# Patient Record
Sex: Male | Born: 1960
Health system: Southern US, Community
[De-identification: ages and names within clinical notes are randomized; demographics above are authoritative.]

## PROBLEM LIST (undated history)

## (undated) DIAGNOSIS — I10 Essential (primary) hypertension: Secondary | ICD-10-CM

## (undated) DIAGNOSIS — Z8661 Personal history of infections of the central nervous system: Secondary | ICD-10-CM

## (undated) DIAGNOSIS — R569 Unspecified convulsions: Secondary | ICD-10-CM

## (undated) DIAGNOSIS — E785 Hyperlipidemia, unspecified: Secondary | ICD-10-CM

## (undated) DIAGNOSIS — H9192 Unspecified hearing loss, left ear: Secondary | ICD-10-CM

## (undated) DIAGNOSIS — T7840XA Allergy, unspecified, initial encounter: Secondary | ICD-10-CM

## (undated) DIAGNOSIS — C801 Malignant (primary) neoplasm, unspecified: Secondary | ICD-10-CM

## (undated) HISTORY — DX: Essential (primary) hypertension: I10

## (undated) HISTORY — PX: COLONOSCOPY: SHX174

## (undated) HISTORY — DX: Unspecified hearing loss, left ear: H91.92

## (undated) HISTORY — DX: Unspecified convulsions: R56.9

## (undated) HISTORY — DX: Personal history of infections of the central nervous system: Z86.61

## (undated) HISTORY — PX: EXPLORATORY LAPAROTOMY: SUR591

## (undated) HISTORY — DX: Allergy, unspecified, initial encounter: T78.40XA

## (undated) HISTORY — PX: WISDOM TOOTH EXTRACTION: SHX21

## (undated) HISTORY — DX: Hyperlipidemia, unspecified: E78.5

---

## 2013-05-20 ENCOUNTER — Ambulatory Visit (INDEPENDENT_AMBULATORY_CARE_PROVIDER_SITE_OTHER): Payer: BC Managed Care – PPO | Admitting: Family Medicine

## 2013-05-20 ENCOUNTER — Encounter: Payer: Self-pay | Admitting: Family Medicine

## 2013-05-20 VITALS — BP 138/87 | HR 108 | Temp 99.4°F | Wt 230.4 lb

## 2013-05-20 DIAGNOSIS — J329 Chronic sinusitis, unspecified: Secondary | ICD-10-CM

## 2013-05-20 MED ORDER — METHYLPREDNISOLONE (PAK) 4 MG PO TABS: ORAL_TABLET | ORAL | Status: DC

## 2013-05-20 MED ORDER — LEVOFLOXACIN 500 MG PO TABS: 500.0000 mg | ORAL_TABLET | Freq: Every day | ORAL | Status: DC

## 2013-05-20 NOTE — Patient Instructions (Signed)

## 2013-05-20 NOTE — Progress Notes (Signed)
  Subjective:    Patient ID: MAXIMILIANO CROMARTIE, male    DOB: [birth year], 52 y.o.   MRN: [id number]  HPI This 52 y.o. male presents for evaluation of sinus congestion, fever, facial pain, and Mucopurulent sinus drainage.   Review of Systems C/o sinus congestion, facial discomfort. No chest pain, SOB, HA, dizziness, vision change, N/V, diarrhea, constipation, dysuria, urinary urgency or frequency, myalgias, arthralgias or rash.     Objective:   Physical Exam Vital signs noted  Well developed well nourished male.  HEENT - Head atraumatic Normocephalic                Eyes - PERRLA, Conjuctiva - clear Sclera- Clear EOMI                Ears - EAC's Wnl TM's Wnl Gross Hearing WNL                Nose - Nares patent                 Throat - oropharanx wnl                Face - TTP maxillary sinuses Respiratory - Lungs CTA bilateral Cardiac - RRR S1 and S2 without murmur GI - Abdomen soft Nontender and bowel sounds active x 4 Extremities - No edema. Neuro - Grossly intact.       Assessment & Plan:  Sinusitis - Plan: levofloxacin (LEVAQUIN) 500 MG tablet, methylPREDNIsolone (MEDROL DOSPACK) 4 MG tablet

## 2013-05-20 NOTE — Progress Notes (Signed)
  Subjective:    Patient ID: Carlos Miranda, male    DOB: 24-Jul-1961, 52 y.o.   MRN: [id number]  HPI Pt c/o of fever and chills that started Sunday evening. Pt also c/o a constant headache that is been going on for about 4 days that is a pounding and aching. Pain is 7/10 and located in the center radiating to down. Pt states he has taken ibuprofen with mild relief.      Review of Systems  Constitutional: Positive for fever, chills and fatigue.  HENT: Positive for congestion.   Neurological: Positive for weakness and headaches.  All other systems reviewed and are negative.       Objective:   Physical Exam  Constitutional: He is oriented to person, place, and time. He appears well-developed and well-nourished.  Eyes: Pupils are equal, round, and reactive to light.  Neck: Normal range of motion. Neck supple. No thyromegaly present.  Cardiovascular: Normal rate, regular rhythm, normal heart sounds and intact distal pulses.   Pulmonary/Chest: Effort normal and breath sounds normal.  Abdominal: Soft. Bowel sounds are normal. He exhibits no distension. There is no tenderness.  Musculoskeletal: Normal range of motion. He exhibits no edema.  Tenderness in sinus   Neurological: He is alert and oriented to person, place, and time.  Skin: Skin is warm and dry.  Psychiatric: He has a normal mood and affect. His behavior is normal. Judgment and thought content normal.          Assessment & Plan:

## 2013-08-07 ENCOUNTER — Encounter: Payer: Self-pay | Admitting: Family Medicine

## 2013-08-07 ENCOUNTER — Ambulatory Visit (INDEPENDENT_AMBULATORY_CARE_PROVIDER_SITE_OTHER): Payer: BC Managed Care – PPO | Admitting: Family Medicine

## 2013-08-07 ENCOUNTER — Encounter (INDEPENDENT_AMBULATORY_CARE_PROVIDER_SITE_OTHER): Payer: Self-pay

## 2013-08-07 ENCOUNTER — Telehealth: Payer: Self-pay | Admitting: Family Medicine

## 2013-08-07 VITALS — BP 129/84 | HR 84 | Temp 98.4°F | Ht 68.0 in | Wt 224.0 lb

## 2013-08-07 DIAGNOSIS — L309 Dermatitis, unspecified: Secondary | ICD-10-CM

## 2013-08-07 DIAGNOSIS — Z23 Encounter for immunization: Secondary | ICD-10-CM

## 2013-08-07 DIAGNOSIS — L259 Unspecified contact dermatitis, unspecified cause: Secondary | ICD-10-CM

## 2013-08-07 MED ORDER — METHYLPREDNISOLONE ACETATE 80 MG/ML IJ SUSP
80.0000 mg | Freq: Once | INTRAMUSCULAR | Status: AC
  Administered 2013-08-07: 80 mg via INTRAMUSCULAR

## 2013-08-07 MED ORDER — HYDROXYZINE HCL 25 MG PO TABS: 25.0000 mg | ORAL_TABLET | Freq: Three times a day (TID) | ORAL | Status: DC | PRN

## 2013-08-07 MED ORDER — METHYLPREDNISOLONE (PAK) 4 MG PO TABS: ORAL_TABLET | ORAL | Status: DC

## 2013-08-07 MED ORDER — NYSTATIN-TRIAMCINOLONE 100000-0.1 UNIT/GM-% EX OINT: TOPICAL_OINTMENT | Freq: Two times a day (BID) | CUTANEOUS | Status: DC

## 2013-08-07 NOTE — Telephone Encounter (Signed)
PT HAD A REACTION LAST YEAR TO PREDNISONE. MADE HIM VERY CONFUSED PER WIFE. SHE WANTS TO DISCUSS WITH YOU. PLEASE CALL

## 2013-08-07 NOTE — Telephone Encounter (Signed)
Have him hold medrol dose pack and use cream and follow up prn

## 2013-08-07 NOTE — Progress Notes (Signed)
  Subjective:    Patient ID: Carlos Miranda, male    DOB: 1960-12-26, 52 y.o.   MRN: [id number]  HPI  This 52 y.o. male presents for evaluation of rash and pruritis over his abdomen And chest for a few days.  He has been itching and taking benadryl.  He has Been working with a wood that is known to cause dermatitis.  Review of Systems C/o rash   No chest pain, SOB, HA, dizziness, vision change, N/V, diarrhea, constipation, dysuria, urinary urgency or frequency, myalgias, arthralgias or rash.  Objective:   Physical Exam Vital signs noted  Well developed well nourished male.  HEENT - Head atraumatic Normocephalic                Eyes - PERRLA, Conjuctiva - clear Sclera- Clear EOMI                Ears - EAC's Wnl TM's Wnl Gross Hearing WNL                Nose - Nares patent                 Throat - oropharanx wnl Respiratory - Lungs CTA bilateral Cardiac - RRR S1 and S2 without murmur GI - Abdomen soft Nontender and bowel sounds active x 4 Skin- Erythematous raised rash over abdomen and chest   Depomedrol80mg  IM given right gluteous    Assessment & Plan:  Dermatitis - Plan: methylPREDNISolone acetate (DEPO-MEDROL) injection 80 mg, hydrOXYzine (ATARAX/VISTARIL) 25 MG tablet, methylPREDNIsolone (MEDROL DOSPACK) 4 MG tablet, nystatin-triamcinolone ointment (MYCOLOG)  Need for prophylactic vaccination and inoculation against influenza  Deatra Canter FNP

## 2013-08-07 NOTE — Telephone Encounter (Signed)
[phone] CORRECT NUMBER. WIFE NOTIFIED AND VERBALIZED UNDERSTANDING. WILL CALL IF ANY PROBLEMS

## 2013-08-07 NOTE — Patient Instructions (Signed)

## 2013-10-28 ENCOUNTER — Telehealth: Payer: Self-pay | Admitting: Family Medicine

## 2013-11-04 NOTE — Telephone Encounter (Signed)
No labwork since 12/27/11

## 2013-11-05 ENCOUNTER — Other Ambulatory Visit: Payer: Self-pay | Admitting: Family Medicine

## 2013-11-05 ENCOUNTER — Telehealth: Payer: Self-pay | Admitting: Family Medicine

## 2013-11-05 MED ORDER — LISINOPRIL 10 MG PO TABS: 10.0000 mg | ORAL_TABLET | Freq: Every day | ORAL | Status: DC

## 2013-11-05 NOTE — Telephone Encounter (Signed)
[facility]  If having difficulty then can print rx off and give to patient to send to [facility] but should go through ok

## 2013-11-07 ENCOUNTER — Other Ambulatory Visit: Payer: Self-pay | Admitting: *Deleted

## 2013-11-07 MED ORDER — LISINOPRIL 10 MG PO TABS: 10.0000 mg | ORAL_TABLET | Freq: Every day | ORAL | Status: DC

## 2013-11-07 MED ORDER — PRAVASTATIN SODIUM 40 MG PO TABS: 40.0000 mg | ORAL_TABLET | Freq: Every day | ORAL | Status: DC

## 2013-11-07 NOTE — Telephone Encounter (Signed)
Patient last seen in office for an acute visit on 11-07-12. Last chronic follow up visit 12-06-11 with Mitzi Hansen. Please advise

## 2014-03-26 ENCOUNTER — Other Ambulatory Visit: Payer: Self-pay | Admitting: Nurse Practitioner

## 2014-04-30 ENCOUNTER — Telehealth: Payer: Self-pay | Admitting: Family Medicine

## 2014-05-04 MED ORDER — PRAVASTATIN SODIUM 40 MG PO TABS: 40.0000 mg | ORAL_TABLET | Freq: Every day | ORAL | Status: DC

## 2014-05-04 NOTE — Telephone Encounter (Signed)
VO per [facility], fill the 30 day rx, but pt must be seen before a 90 day can be filled. Pt aware.

## 2014-05-22 ENCOUNTER — Ambulatory Visit (INDEPENDENT_AMBULATORY_CARE_PROVIDER_SITE_OTHER): Payer: BC Managed Care – PPO | Admitting: Family Medicine

## 2014-05-22 ENCOUNTER — Encounter: Payer: Self-pay | Admitting: Family Medicine

## 2014-05-22 ENCOUNTER — Telehealth: Payer: Self-pay | Admitting: Family Medicine

## 2014-05-22 VITALS — BP 117/75 | HR 84 | Temp 98.5°F | Ht 68.0 in | Wt 232.2 lb

## 2014-05-22 DIAGNOSIS — E785 Hyperlipidemia, unspecified: Secondary | ICD-10-CM

## 2014-05-22 DIAGNOSIS — I10 Essential (primary) hypertension: Secondary | ICD-10-CM

## 2014-05-22 DIAGNOSIS — J0120 Acute ethmoidal sinusitis, unspecified: Secondary | ICD-10-CM

## 2014-05-22 DIAGNOSIS — Z0000 Encounter for general adult medical examination without abnormal findings: Secondary | ICD-10-CM

## 2014-05-22 LAB — POCT CBC
Granulocyte percent: 56.7 %G (ref 37–80)
HCT, POC: 49.5 % (ref 43.5–53.7)
Hemoglobin: 16.2 g/dL (ref 14.1–18.1)
Lymph, poc: 2.5 (ref 0.6–3.4)
MCH, POC: 30.3 pg (ref 27–31.2)
MCHC: 32.7 g/dL (ref 31.8–35.4)
MCV: 92.7 fL (ref 80–97)
MPV: 8 fL (ref 0–99.8)
POC Granulocyte: 3.9 (ref 2–6.9)
POC LYMPH PERCENT: 35.6 %L (ref 10–50)
Platelet Count, POC: 214 10*3/uL (ref 142–424)
RBC: 5.3 M/uL (ref 4.69–6.13)
RDW, POC: 13.2 %
WBC: 6.9 10*3/uL (ref 4.6–10.2)

## 2014-05-22 MED ORDER — LISINOPRIL 10 MG PO TABS: 10.0000 mg | ORAL_TABLET | Freq: Every day | ORAL | Status: DC

## 2014-05-22 MED ORDER — PRAVASTATIN SODIUM 40 MG PO TABS: 40.0000 mg | ORAL_TABLET | Freq: Every day | ORAL | Status: DC

## 2014-05-22 MED ORDER — LEVOFLOXACIN 500 MG PO TABS: 500.0000 mg | ORAL_TABLET | Freq: Every day | ORAL | Status: DC

## 2014-05-22 MED ORDER — METHYLPREDNISOLONE ACETATE 80 MG/ML IJ SUSP
80.0000 mg | Freq: Once | INTRAMUSCULAR | Status: AC
  Administered 2014-05-22: 80 mg via INTRAMUSCULAR

## 2014-05-22 NOTE — Progress Notes (Signed)
   Subjective:    Patient ID: Carlos Miranda, male    DOB: February 14, 1961, 53 y.o.   MRN: [id number]  HPI This 53 y.o. male presents for evaluation of sinus infection and he is here for routine visit. He needs refills and cpe labs.   Review of Systems No chest pain, SOB, HA, dizziness, vision change, N/V, diarrhea, constipation, dysuria, urinary urgency or frequency, myalgias, arthralgias or rash.     Objective:   Physical Exam Vital signs noted  Well developed well nourished male.  HEENT - Head atraumatic Normocephalic                Eyes - PERRLA, Conjuctiva - clear Sclera- Clear EOMI                Ears - EAC's Wnl TM's Wnl Gross Hearing WNL                Nose - Nares patent                 Throat - oropharanx wnl Respiratory - Lungs CTA bilateral Cardiac - RRR S1 and S2 without murmur GI - Abdomen soft Nontender and bowel sounds active x 4 Extremities - No edema. Neuro - Grossly intact.       Assessment & Plan:  Routine general medical examination at a health care facility - Plan: POCT CBC, CMP14+EGFR, Lipid panel, Thyroid Panel With TSH, PSA, total and free, TSH  Subacute ethmoidal sinusitis - Plan: levofloxacin (LEVAQUIN) 500 MG tablet, methylPREDNISolone acetate (DEPO-MEDROL) injection 80 mg  Other and unspecified hyperlipidemia - Plan: pravastatin (PRAVACHOL) 40 MG tablet, DISCONTINUED: pravastatin (PRAVACHOL) 40 MG tablet  Essential hypertension - Plan: lisinopril (PRINIVIL,ZESTRIL) 10 MG tablet, DISCONTINUED: lisinopril (PRINIVIL,ZESTRIL) 10 MG tablet  Lysbeth Penner FNP

## 2014-05-22 NOTE — Telephone Encounter (Signed)
[facility]

## 2014-05-23 LAB — THYROID PANEL WITH TSH
Free Thyroxine Index: 2.8 (ref 1.2–4.9)
T3 Uptake Ratio: 24 % (ref 24–39)
T4, Total: 11.5 ug/dL (ref 4.5–12.0)
TSH: 2.37 u[IU]/mL (ref 0.450–4.500)

## 2014-05-23 LAB — CMP14+EGFR
ALT: 20 IU/L (ref 0–44)
AST: 18 IU/L (ref 0–40)
Albumin/Globulin Ratio: 2 (ref 1.1–2.5)
Albumin: 4.6 g/dL (ref 3.5–5.5)
Alkaline Phosphatase: 97 IU/L (ref 39–117)
BUN/Creatinine Ratio: 16 (ref 9–20)
BUN: 16 mg/dL (ref 6–24)
CO2: 22 mmol/L (ref 18–29)
Calcium: 9.5 mg/dL (ref 8.7–10.2)
Chloride: 99 mmol/L (ref 97–108)
Creatinine, Ser: 1 mg/dL (ref 0.76–1.27)
GFR calc Af Amer: 100 mL/min/{1.73_m2} (ref >59)
GFR calc non Af Amer: 86 mL/min/{1.73_m2} (ref >59)
Globulin, Total: 2.3 g/dL (ref 1.5–4.5)
Glucose: 93 mg/dL (ref 65–99)
Potassium: 4.5 mmol/L (ref 3.5–5.2)
Sodium: 139 mmol/L (ref 134–144)
Total Bilirubin: 0.5 mg/dL (ref 0.0–1.2)
Total Protein: 6.9 g/dL (ref 6.0–8.5)

## 2014-05-23 LAB — PSA, TOTAL AND FREE
PSA, Free Pct: 30 %
PSA, Free: 0.12 ng/mL
PSA: 0.4 ng/mL (ref 0.0–4.0)

## 2014-05-23 LAB — LIPID PANEL
Chol/HDL Ratio: 4.1 ratio units (ref 0.0–5.0)
Cholesterol, Total: 165 mg/dL (ref 100–199)
HDL: 40 mg/dL (ref >39)
LDL Calculated: 79 mg/dL (ref 0–99)
Triglycerides: 232 mg/dL — ABNORMAL HIGH (ref 0–149)
VLDL Cholesterol Cal: 46 mg/dL — ABNORMAL HIGH (ref 5–40)

## 2014-06-11 ENCOUNTER — Ambulatory Visit: Payer: BC Managed Care – PPO | Admitting: Family Medicine

## 2014-09-03 ENCOUNTER — Telehealth: Payer: Self-pay | Admitting: Family Medicine

## 2014-09-03 DIAGNOSIS — Z23 Encounter for immunization: Secondary | ICD-10-CM

## 2014-09-03 MED ORDER — ZOSTER VACCINE LIVE 19400 UNT/0.65ML ~~LOC~~ SOLR: 0.6500 mL | Freq: Once | SUBCUTANEOUS | Status: DC

## 2014-09-03 NOTE — Telephone Encounter (Signed)
Prescription sent in for zostavax and patient aware

## 2015-04-08 ENCOUNTER — Other Ambulatory Visit: Payer: Self-pay | Admitting: Family Medicine

## 2015-05-25 ENCOUNTER — Telehealth: Payer: Self-pay | Admitting: Physician Assistant

## 2015-05-25 ENCOUNTER — Encounter: Payer: Self-pay | Admitting: Physician Assistant

## 2015-05-25 ENCOUNTER — Ambulatory Visit (INDEPENDENT_AMBULATORY_CARE_PROVIDER_SITE_OTHER): Payer: BLUE CROSS/BLUE SHIELD | Admitting: Physician Assistant

## 2015-05-25 VITALS — BP 142/89 | HR 88 | Temp 97.4°F | Ht 68.0 in | Wt 236.3 lb

## 2015-05-25 DIAGNOSIS — M545 Low back pain, unspecified: Secondary | ICD-10-CM

## 2015-05-25 DIAGNOSIS — R350 Frequency of micturition: Secondary | ICD-10-CM | POA: Diagnosis not present

## 2015-05-25 DIAGNOSIS — M79671 Pain in right foot: Secondary | ICD-10-CM

## 2015-05-25 LAB — POCT URINALYSIS DIPSTICK
Bilirubin, UA: NEGATIVE
Blood, UA: NEGATIVE
Glucose, UA: NEGATIVE
Ketones, UA: NEGATIVE
Leukocytes, UA: NEGATIVE
Nitrite, UA: NEGATIVE
Spec Grav, UA: 1.015
Urobilinogen, UA: NEGATIVE
pH, UA: 8

## 2015-05-25 LAB — POCT UA - MICROSCOPIC ONLY
Bacteria, U Microscopic: NEGATIVE
Casts, Ur, LPF, POC: NEGATIVE
Crystals, Ur, HPF, POC: NEGATIVE
RBC, urine, microscopic: NEGATIVE
WBC, Ur, HPF, POC: NEGATIVE
Yeast, UA: NEGATIVE

## 2015-05-25 MED ORDER — BACLOFEN 10 MG PO TABS: 10.0000 mg | ORAL_TABLET | Freq: Three times a day (TID) | ORAL | Status: DC

## 2015-05-25 MED ORDER — PREDNISONE 10 MG (21) PO TBPK: ORAL_TABLET | ORAL | Status: DC

## 2015-05-25 MED ORDER — MELOXICAM 15 MG PO TABS: 15.0000 mg | ORAL_TABLET | Freq: Every day | ORAL | Status: DC

## 2015-05-25 NOTE — Telephone Encounter (Signed)
We do not have that as an allergy for him. What is his reason for not being able to take it? There is no substitute.If he can't take it,  Continue to take mobic and baclofen. Keep follow up in 2 weeks

## 2015-05-25 NOTE — Progress Notes (Signed)
Subjective:    Patient ID: Carlos Miranda, male    DOB: June 24, 1961, 54 y.o.   MRN: [id number]  HPI 54 y/o male presents with c/o lower right sided back pain x 2 days. He woke up and the pain was present. Denies injury. Pain is Dull pain to sharp shooting pain but constant. More localized to right side. Pain is worse with twisting. Better with sitting. Has tried a few ibuprofen with some relief.   Also has right side heel pain occasionally. He was diagnosed with heel spurs 20 years ago and given a prednisone injection, with relief.     Review of Systems  Endocrine: Negative for polyuria.  Genitourinary: Positive for frequency. Negative for dysuria, urgency, hematuria, flank pain and difficulty urinating.  Musculoskeletal: Positive for myalgias and back pain. Arthralgias: right side low back pain , worse with twisting , radiates to left side occasionally   Neurological: Negative for numbness.       Objective:   Physical Exam  Constitutional: He is oriented to person, place, and time. He appears well-developed and well-nourished.  Musculoskeletal: He exhibits tenderness (right paraspinal muscle and trapezius). He exhibits no edema.  Pain increases with AROM, lateral rotation and forward flexion   Neurological: He is alert and oriented to person, place, and time.  Nursing note and vitals reviewed.         Assessment & Plan:  1. Right-sided low back pain without sciatica  - predniSONE (STERAPRED UNI-PAK 21 TAB) 10 MG (21) TBPK tablet; 6 pills PO on day 1, 5 on day 2, 4 on day 3, 3 on day 4, 2 on day 5, 1 on day 6  Dispense: 21 tablet; Refill: 0 - baclofen (LIORESAL) 10 MG tablet; Take 1 tablet (10 mg total) by mouth 3 (three) times daily.  Dispense: 30 each; Refill: 0 - meloxicam (MOBIC) 15 MG tablet; Take 1 tablet (15 mg total) by mouth daily.  Dispense: 30 tablet; Refill: 0  2. Urinary frequency  - POCT UA - Microscopic Only - POCT urinalysis dipstick  3. Heel pain, right -  If no improvement in 2 weeks at recheck will injection intralesional with depomedrol    RTO 2 week   Edris Friedt A. Chauncey Reading PA-C

## 2015-05-25 NOTE — Patient Instructions (Signed)
Low Back Sprain with Rehab  A sprain is an injury in which a ligament is torn. The ligaments of the lower back are vulnerable to sprains. However, they are strong and require great force to be injured. These ligaments are important for stabilizing the spinal column. Sprains are classified into three categories. Grade 1 sprains cause pain, but the tendon is not lengthened. Grade 2 sprains include a lengthened ligament, due to the ligament being stretched or partially ruptured. With grade 2 sprains there is still function, although the function may be decreased. Grade 3 sprains involve a complete tear of the tendon or muscle, and function is usually impaired. SYMPTOMS   Severe pain in the lower back.  Sometimes, a feeling of a "pop," "snap," or tear, at the time of injury.  Tenderness and sometimes swelling at the injury site.  Uncommonly, bruising (contusion) within 48 hours of injury.  Muscle spasms in the back. CAUSES  Low back sprains occur when a force is placed on the ligaments that is greater than they can handle. Common causes of injury include:  Performing a stressful act while off-balance.  Repetitive stressful activities that involve movement of the lower back.  Direct hit (trauma) to the lower back. RISK INCREASES WITH:  Contact sports (football, wrestling).  Collisions (major skiing accidents).  Sports that require throwing or lifting (baseball, weightlifting).  Sports involving twisting of the spine (gymnastics, diving, tennis, golf).  Poor strength and flexibility.  Inadequate protection.  Previous back injury or surgery (especially fusion). PREVENTION  Wear properly fitted and padded protective equipment.  Warm up and stretch properly before activity.  Allow for adequate recovery between workouts.  Maintain physical fitness:  Strength, flexibility, and endurance.  Cardiovascular fitness.  Maintain a healthy body weight. PROGNOSIS  If treated  properly, low back sprains usually heal with non-surgical treatment. The length of time for healing depends on the severity of the injury.  RELATED COMPLICATIONS   Recurring symptoms, resulting in a chronic problem.  Chronic inflammation and pain in the low back.  Delayed healing or resolution of symptoms, especially if activity is resumed too soon.  Prolonged impairment.  Unstable or arthritic joints of the low back. TREATMENT  Treatment first involves the use of ice and medicine, to reduce pain and inflammation. The use of strengthening and stretching exercises may help reduce pain with activity. These exercises may be performed at home or with a therapist. Severe injuries may require referral to a therapist for further evaluation and treatment, such as ultrasound. Your caregiver may advise that you wear a back brace or corset, to help reduce pain and discomfort. Often, prolonged bed rest results in greater harm then benefit. Corticosteroid injections may be recommended. However, these should be reserved for the most serious cases. It is important to avoid using your back when lifting objects. At night, sleep on your back on a firm mattress, with a pillow placed under your knees. If non-surgical treatment is unsuccessful, surgery may be needed.  MEDICATION   If pain medicine is needed, nonsteroidal anti-inflammatory medicines (aspirin and ibuprofen), or other minor pain relievers (acetaminophen), are often advised.  Do not take pain medicine for 7 days before surgery.  Prescription pain relievers may be given, if your caregiver thinks they are needed. Use only as directed and only as much as you need.  Ointments applied to the skin may be helpful.  Corticosteroid injections may be given by your caregiver. These injections should be reserved for the most serious cases,   because they may only be given a certain number of times. HEAT AND COLD  Cold treatment (icing) should be applied for 10  to 15 minutes every 2 to 3 hours for inflammation and pain, and immediately after activity that aggravates your symptoms. Use ice packs or an ice massage.  Heat treatment may be used before performing stretching and strengthening activities prescribed by your caregiver, physical therapist, or athletic trainer. Use a heat pack or a warm water soak. SEEK MEDICAL CARE IF:   Symptoms get worse or do not improve in 2 to 4 weeks, despite treatment.  You develop numbness or weakness in either leg.  You lose bowel or bladder function.  Any of the following occur after surgery: fever, increased pain, swelling, redness, drainage of fluids, or bleeding in the affected area.  New, unexplained symptoms develop. (Drugs used in treatment may produce side effects.) EXERCISES  RANGE OF MOTION (ROM) AND STRETCHING EXERCISES - Low Back Sprain Most people with lower back pain will find that their symptoms get worse with excessive bending forward (flexion) or arching at the lower back (extension). The exercises that will help resolve your symptoms will focus on the opposite motion.  Your physician, physical therapist or athletic trainer will help you determine which exercises will be most helpful to resolve your lower back pain. Do not complete any exercises without first consulting with your caregiver. Discontinue any exercises which make your symptoms worse, until you speak to your caregiver. If you have pain, numbness or tingling which travels down into your buttocks, leg or foot, the goal of the therapy is for these symptoms to move closer to your back and eventually resolve. Sometimes, these leg symptoms will get better, but your lower back pain may worsen. This is often an indication of progress in your rehabilitation. Be very alert to any changes in your symptoms and the activities in which you participated in the 24 hours prior to the change. Sharing this information with your caregiver will allow him or her to  most efficiently treat your condition. These exercises may help you when beginning to rehabilitate your injury. Your symptoms may resolve with or without further involvement from your physician, physical therapist or athletic trainer. While completing these exercises, remember:   Restoring tissue flexibility helps normal motion to return to the joints. This allows healthier, less painful movement and activity.  An effective stretch should be held for at least 30 seconds.  A stretch should never be painful. You should only feel a gentle lengthening or release in the stretched tissue. FLEXION RANGE OF MOTION AND STRETCHING EXERCISES: STRETCH - Flexion, Single Knee to Chest   Lie on a firm bed or floor with both legs extended in front of you.  Keeping one leg in contact with the floor, bring your opposite knee to your chest. Hold your leg in place by either grabbing behind your thigh or at your knee.  Pull until you feel a gentle stretch in your low back. Hold __________ seconds.  Slowly release your grasp and repeat the exercise with the opposite side. Repeat __________ times. Complete this exercise __________ times per day.  STRETCH - Flexion, Double Knee to Chest  Lie on a firm bed or floor with both legs extended in front of you.  Keeping one leg in contact with the floor, bring your opposite knee to your chest.  Tense your stomach muscles to support your back and then lift your other knee to your chest. Hold your legs   in place by either grabbing behind your thighs or at your knees.  Pull both knees toward your chest until you feel a gentle stretch in your low back. Hold __________ seconds.  Tense your stomach muscles and slowly return one leg at a time to the floor. Repeat __________ times. Complete this exercise __________ times per day.  STRETCH - Low Trunk Rotation  Lie on a firm bed or floor. Keeping your legs in front of you, bend your knees so they are both pointed toward the  ceiling and your feet are flat on the floor.  Extend your arms out to the side. This will stabilize your upper body by keeping your shoulders in contact with the floor.  Gently and slowly drop both knees together to one side until you feel a gentle stretch in your low back. Hold for __________ seconds.  Tense your stomach muscles to support your lower back as you bring your knees back to the starting position. Repeat the exercise to the other side. Repeat __________ times. Complete this exercise __________ times per day  EXTENSION RANGE OF MOTION AND FLEXIBILITY EXERCISES: STRETCH - Extension, Prone on Elbows   Lie on your stomach on the floor, a bed will be too soft. Place your palms about shoulder width apart and at the height of your head.  Place your elbows under your shoulders. If this is too painful, stack pillows under your chest.  Allow your body to relax so that your hips drop lower and make contact more completely with the floor.  Hold this position for __________ seconds.  Slowly return to lying flat on the floor. Repeat __________ times. Complete this exercise __________ times per day.  RANGE OF MOTION - Extension, Prone Press Ups  Lie on your stomach on the floor, a bed will be too soft. Place your palms about shoulder width apart and at the height of your head.  Keeping your back as relaxed as possible, slowly straighten your elbows while keeping your hips on the floor. You may adjust the placement of your hands to maximize your comfort. As you gain motion, your hands will come more underneath your shoulders.  Hold this position __________ seconds.  Slowly return to lying flat on the floor. Repeat __________ times. Complete this exercise __________ times per day.  RANGE OF MOTION- Quadruped, Neutral Spine   Assume a hands and knees position on a firm surface. Keep your hands under your shoulders and your knees under your hips. You may place padding under your knees for  comfort.  Drop your head and point your tailbone toward the ground below you. This will round out your lower back like an angry cat. Hold this position for __________ seconds.  Slowly lift your head and release your tail bone so that your back sags into a large arch, like an old horse.  Hold this position for __________ seconds.  Repeat this until you feel limber in your low back.  Now, find your "sweet spot." This will be the most comfortable position somewhere between the two previous positions. This is your neutral spine. Once you have found this position, tense your stomach muscles to support your low back.  Hold this position for __________ seconds. Repeat __________ times. Complete this exercise __________ times per day.  STRENGTHENING EXERCISES - Low Back Sprain These exercises may help you when beginning to rehabilitate your injury. These exercises should be done near your "sweet spot." This is the neutral, low-back arch, somewhere between fully rounded   and fully arched, that is your least painful position. When performed in this safe range of motion, these exercises can be used for people who have either a flexion or extension based injury. These exercises may resolve your symptoms with or without further involvement from your physician, physical therapist or athletic trainer. While completing these exercises, remember:   Muscles can gain both the endurance and the strength needed for everyday activities through controlled exercises.  Complete these exercises as instructed by your physician, physical therapist or athletic trainer. Increase the resistance and repetitions only as guided.  You may experience muscle soreness or fatigue, but the pain or discomfort you are trying to eliminate should never worsen during these exercises. If this pain does worsen, stop and make certain you are following the directions exactly. If the pain is still present after adjustments, discontinue the  exercise until you can discuss the trouble with your caregiver. STRENGTHENING - Deep Abdominals, Pelvic Tilt   Lie on a firm bed or floor. Keeping your legs in front of you, bend your knees so they are both pointed toward the ceiling and your feet are flat on the floor.  Tense your lower abdominal muscles to press your low back into the floor. This motion will rotate your pelvis so that your tail bone is scooping upwards rather than pointing at your feet or into the floor. With a gentle tension and even breathing, hold this position for __________ seconds. Repeat __________ times. Complete this exercise __________ times per day.  STRENGTHENING - Abdominals, Crunches   Lie on a firm bed or floor. Keeping your legs in front of you, bend your knees so they are both pointed toward the ceiling and your feet are flat on the floor. Cross your arms over your chest.  Slightly tip your chin down without bending your neck.  Tense your abdominals and slowly lift your trunk high enough to just clear your shoulder blades. Lifting higher can put excessive stress on the lower back and does not further strengthen your abdominal muscles.  Control your return to the starting position. Repeat __________ times. Complete this exercise __________ times per day.  STRENGTHENING - Quadruped, Opposite UE/LE Lift   Assume a hands and knees position on a firm surface. Keep your hands under your shoulders and your knees under your hips. You may place padding under your knees for comfort.  Find your neutral spine and gently tense your abdominal muscles so that you can maintain this position. Your shoulders and hips should form a rectangle that is parallel with the floor and is not twisted.  Keeping your trunk steady, lift your right hand no higher than your shoulder and then your left leg no higher than your hip. Make sure you are not holding your breath. Hold this position for __________ seconds.  Continuing to keep  your abdominal muscles tense and your back steady, slowly return to your starting position. Repeat with the opposite arm and leg. Repeat __________ times. Complete this exercise __________ times per day.  STRENGTHENING - Abdominals and Quadriceps, Straight Leg Raise   Lie on a firm bed or floor with both legs extended in front of you.  Keeping one leg in contact with the floor, bend the other knee so that your foot can rest flat on the floor.  Find your neutral spine, and tense your abdominal muscles to maintain your spinal position throughout the exercise.  Slowly lift your straight leg off the floor about 6 inches for a count   of 15, making sure to not hold your breath.  Still keeping your neutral spine, slowly lower your leg all the way to the floor. Repeat this exercise with each leg __________ times. Complete this exercise __________ times per day. POSTURE AND BODY MECHANICS CONSIDERATIONS - Low Back Sprain Keeping correct posture when sitting, standing or completing your activities will reduce the stress put on different body tissues, allowing injured tissues a chance to heal and limiting painful experiences. The following are general guidelines for improved posture. Your physician or physical therapist will provide you with any instructions specific to your needs. While reading these guidelines, remember:  The exercises prescribed by your provider will help you have the flexibility and strength to maintain correct postures.  The correct posture provides the best environment for your joints to work. All of your joints have less wear and tear when properly supported by a spine with good posture. This means you will experience a healthier, less painful body.  Correct posture must be practiced with all of your activities, especially prolonged sitting and standing. Correct posture is as important when doing repetitive low-stress activities (typing) as it is when doing a single heavy-load  activity (lifting). RESTING POSITIONS Consider which positions are most painful for you when choosing a resting position. If you have pain with flexion-based activities (sitting, bending, stooping, squatting), choose a position that allows you to rest in a less flexed posture. You would want to avoid curling into a fetal position on your side. If your pain worsens with extension-based activities (prolonged standing, working overhead), avoid resting in an extended position such as sleeping on your stomach. Most people will find more comfort when they rest with their spine in a more neutral position, neither too rounded nor too arched. Lying on a non-sagging bed on your side with a pillow between your knees, or on your back with a pillow under your knees will often provide some relief. Keep in mind, being in any one position for a prolonged period of time, no matter how correct your posture, can still lead to stiffness. PROPER SITTING POSTURE In order to minimize stress and discomfort on your spine, you must sit with correct posture. Sitting with good posture should be effortless for a healthy body. Returning to good posture is a gradual process. Many people can work toward this most comfortably by using various supports until they have the flexibility and strength to maintain this posture on their own. When sitting with proper posture, your ears will fall over your shoulders and your shoulders will fall over your hips. You should use the back of the chair to support your upper back. Your lower back will be in a neutral position, just slightly arched. You may place a small pillow or folded towel at the base of your lower back for  support.  When working at a desk, create an environment that supports good, upright posture. Without extra support, muscles tire, which leads to excessive strain on joints and other tissues. Keep these recommendations in mind: CHAIR:  A chair should be able to slide under your desk  when your back makes contact with the back of the chair. This allows you to work closely.  The chair's height should allow your eyes to be level with the upper part of your monitor and your hands to be slightly lower than your elbows. BODY POSITION  Your feet should make contact with the floor. If this is not possible, use a foot rest.  Keep your   ears over your shoulders. This will reduce stress on your neck and low back. INCORRECT SITTING POSTURES  If you are feeling tired and unable to assume a healthy sitting posture, do not slouch or slump. This puts excessive strain on your back tissues, causing more damage and pain. Healthier options include:  Using more support, like a lumbar pillow.  Switching tasks to something that requires you to be upright or walking.  Talking a brief walk.  Lying down to rest in a neutral-spine position. PROLONGED STANDING WHILE SLIGHTLY LEANING FORWARD  When completing a task that requires you to lean forward while standing in one place for a long time, place either foot up on a stationary 2-4 inch high object to help maintain the best posture. When both feet are on the ground, the lower back tends to lose its slight inward curve. If this curve flattens (or becomes too large), then the back and your other joints will experience too much stress, tire more quickly, and can cause pain. CORRECT STANDING POSTURES Proper standing posture should be assumed with all daily activities, even if they only take a few moments, like when brushing your teeth. As in sitting, your ears should fall over your shoulders and your shoulders should fall over your hips. You should keep a slight tension in your abdominal muscles to brace your spine. Your tailbone should point down to the ground, not behind your body, resulting in an over-extended swayback posture.  INCORRECT STANDING POSTURES  Common incorrect standing postures include a forward head, locked knees and/or an excessive  swayback. WALKING Walk with an upright posture. Your ears, shoulders and hips should all line-up. PROLONGED ACTIVITY IN A FLEXED POSITION When completing a task that requires you to bend forward at your waist or lean over a low surface, try to find a way to stabilize 3 out of 4 of your limbs. You can place a hand or elbow on your thigh or rest a knee on the surface you are reaching across. This will provide you more stability, so that your muscles do not tire as quickly. By keeping your knees relaxed, or slightly bent, you will also reduce stress across your lower back. CORRECT LIFTING TECHNIQUES DO :  Assume a wide stance. This will provide you more stability and the opportunity to get as close as possible to the object which you are lifting.  Tense your abdominals to brace your spine. Bend at the knees and hips. Keeping your back locked in a neutral-spine position, lift using your leg muscles. Lift with your legs, keeping your back straight.  Test the weight of unknown objects before attempting to lift them.  Try to keep your elbows locked down at your sides in order get the best strength from your shoulders when carrying an object.  Always ask for help when lifting heavy or awkward objects. INCORRECT LIFTING TECHNIQUES DO NOT:   Lock your knees when lifting, even if it is a small object.  Bend and twist. Pivot at your feet or move your feet when needing to change directions.  Assume that you can safely pick up even a paperclip without proper posture. Document Released: 10/16/2005 Document Revised: 01/08/2012 Document Reviewed: 01/28/2009 [employer] Patient Information 2015 [employer]. This information is not intended to replace advice given to you by your health care provider. Make sure you discuss any questions you have with your health care provider.  

## 2015-05-26 NOTE — Telephone Encounter (Signed)
Patients wife states he got kind of loopy. Patient states he went ahead and took it this am and he will let us know if he has problems.

## 2015-06-02 ENCOUNTER — Other Ambulatory Visit: Payer: Self-pay | Admitting: Family Medicine

## 2015-06-03 ENCOUNTER — Ambulatory Visit (INDEPENDENT_AMBULATORY_CARE_PROVIDER_SITE_OTHER): Payer: BLUE CROSS/BLUE SHIELD | Admitting: Family Medicine

## 2015-06-03 ENCOUNTER — Encounter: Payer: Self-pay | Admitting: Family Medicine

## 2015-06-03 VITALS — BP 141/88 | HR 83 | Temp 96.0°F | Ht 68.0 in | Wt 238.0 lb

## 2015-06-03 DIAGNOSIS — E785 Hyperlipidemia, unspecified: Secondary | ICD-10-CM

## 2015-06-03 DIAGNOSIS — S50902A Unspecified superficial injury of left elbow, initial encounter: Secondary | ICD-10-CM | POA: Diagnosis not present

## 2015-06-03 DIAGNOSIS — W57XXXA Bitten or stung by nonvenomous insect and other nonvenomous arthropods, initial encounter: Secondary | ICD-10-CM | POA: Insufficient documentation

## 2015-06-03 DIAGNOSIS — I10 Essential (primary) hypertension: Secondary | ICD-10-CM

## 2015-06-03 MED ORDER — PRAVASTATIN SODIUM 40 MG PO TABS: 40.0000 mg | ORAL_TABLET | Freq: Every day | ORAL | Status: DC

## 2015-06-03 MED ORDER — LISINOPRIL 10 MG PO TABS: 10.0000 mg | ORAL_TABLET | Freq: Every day | ORAL | Status: DC

## 2015-06-03 NOTE — Assessment & Plan Note (Signed)
Left elbow with lesion suspicious for wasp sting he could've gotten while he was weed eating No signs of cellulitis, reviewed in detail Follow-up as needed

## 2015-06-03 NOTE — Progress Notes (Signed)
Patient ID: Carlos Miranda, male   DOB: 06/19/1961, 53 y.o.   MRN: [id number]   HPI  Patient presents today for follow-up hypertension, hyperlipidemia, and evaluation of elbow lesion  Elbow lesion Patient states he noticed it when he noticed left elbow at work. He has a stinging pain with putting pressure on it. He denies any warmth, drainage, or induration of the area. He also denies any fever or malaise. He feels fine. 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  Cholesterol Watches his diet minimally, requests extra paper prescription to make sure he has his pills when he is on vacation next week.  Hypertension Does not check blood pressure at home Good compliance Mild headaches he attributes to sinuses No chest pain, dyspnea, palpitations Mild pedal edema   PMH: Smoking status noted ROS: Per HPI, otherwise negative  Objective: BP 141/88 mmHg  Pulse 83  Temp(Src) 96 F (35.6 C) (Oral)  Ht 5' 8" (1.727 m)  Wt 238 lb (107.956 kg)  BMI 36.20 kg/m2 Gen: NAD, alert, cooperative with exam HEENT: NCAT CV: RRR, good S1/S2, no murmur Resp: CTABL, no wheezes, non-labored Ext: Trace to 1+ pedal edema bilaterally Skin: Left elbow with 2 lesions, one circular approximately 7-9 mm in diameter with heme crusting, the other was linear about 1.3 cm in length and 1-3 mm wide, they were both slightly swollen with no significant induration, warmth, or drainage. Neuro: Alert and oriented, No gross deficits  Assessment and plan:  HTN (hypertension) Good control, no red flags Continue lisinopril Labs  HLD (hyperlipidemia) Labs Refilled pravastatin Discussed diet   Arthropod bite Left elbow with lesion suspicious for wasp sting he could've gotten while he was weed eating No signs of cellulitis, reviewed in detail Follow-up as needed   Orders Placed This Encounter    Procedures  . CBC with Differential    Standing Status: Future     Number of Occurrences:      Standing Expiration Date: 06/02/2016  . CMP14+EGFR    Standing Status: Future     Number of Occurrences:      Standing Expiration Date: 06/02/2016  . Lipid Panel    Standing Status: Future     Number of Occurrences:      Standing Expiration Date: 06/02/2016    Meds ordered this encounter  Medications  . lisinopril (PRINIVIL,ZESTRIL) 10 MG tablet    Sig: Take 1 tablet (10 mg total) by mouth daily.    Dispense:  90 tablet    Refill:  2  . DISCONTD: pravastatin (PRAVACHOL) 40 MG tablet    Sig: Take 1 tablet (40 mg total) by mouth daily.    Dispense:  90 tablet    Refill:  3  . pravastatin (PRAVACHOL) 40 MG tablet    Sig: Take 1 tablet (40 mg total) by mouth daily.    Dispense:  30 tablet    Refill:  0     

## 2015-06-03 NOTE — Assessment & Plan Note (Signed)
Labs Refilled pravastatin Discussed diet

## 2015-06-03 NOTE — Assessment & Plan Note (Signed)
Good control, no red flags Continue lisinopril Labs

## 2015-06-03 NOTE — Patient Instructions (Signed)
Great to meet you!!  Come back in 6 months

## 2015-06-14 ENCOUNTER — Other Ambulatory Visit: Payer: BLUE CROSS/BLUE SHIELD

## 2015-06-21 ENCOUNTER — Other Ambulatory Visit (INDEPENDENT_AMBULATORY_CARE_PROVIDER_SITE_OTHER): Payer: BLUE CROSS/BLUE SHIELD

## 2015-06-21 DIAGNOSIS — I10 Essential (primary) hypertension: Secondary | ICD-10-CM | POA: Diagnosis not present

## 2015-06-21 DIAGNOSIS — E785 Hyperlipidemia, unspecified: Secondary | ICD-10-CM

## 2015-06-21 NOTE — Progress Notes (Signed)
Lab only 

## 2015-06-22 LAB — LIPID PANEL
Chol/HDL Ratio: 4.4 ratio units (ref 0.0–5.0)
Cholesterol, Total: 161 mg/dL (ref 100–199)
HDL: 37 mg/dL — ABNORMAL LOW (ref >39)
LDL Calculated: 91 mg/dL (ref 0–99)
Triglycerides: 165 mg/dL — ABNORMAL HIGH (ref 0–149)
VLDL Cholesterol Cal: 33 mg/dL (ref 5–40)

## 2015-06-22 LAB — CBC WITH DIFFERENTIAL/PLATELET
Basophils Absolute: 0 10*3/uL (ref 0.0–0.2)
Basos: 1 %
EOS (ABSOLUTE): 0.2 10*3/uL (ref 0.0–0.4)
Eos: 4 %
Hematocrit: 49.2 % (ref 37.5–51.0)
Hemoglobin: 16.2 g/dL (ref 12.6–17.7)
Immature Grans (Abs): 0 10*3/uL (ref 0.0–0.1)
Immature Granulocytes: 0 %
Lymphocytes Absolute: 2.1 10*3/uL (ref 0.7–3.1)
Lymphs: 41 %
MCH: 31.6 pg (ref 26.6–33.0)
MCHC: 32.9 g/dL (ref 31.5–35.7)
MCV: 96 fL (ref 79–97)
Monocytes Absolute: 0.4 10*3/uL (ref 0.1–0.9)
Monocytes: 7 %
Neutrophils Absolute: 2.5 10*3/uL (ref 1.4–7.0)
Neutrophils: 47 %
Platelets: 241 10*3/uL (ref 150–379)
RBC: 5.12 x10E6/uL (ref 4.14–5.80)
RDW: 13.6 % (ref 12.3–15.4)
WBC: 5.2 10*3/uL (ref 3.4–10.8)

## 2015-06-22 LAB — CMP14+EGFR
ALT: 33 IU/L (ref 0–44)
AST: 24 IU/L (ref 0–40)
Albumin/Globulin Ratio: 2 (ref 1.1–2.5)
Albumin: 4.1 g/dL (ref 3.5–5.5)
Alkaline Phosphatase: 90 IU/L (ref 39–117)
BUN/Creatinine Ratio: 9 (ref 9–20)
BUN: 8 mg/dL (ref 6–24)
Bilirubin Total: 0.5 mg/dL (ref 0.0–1.2)
CO2: 24 mmol/L (ref 18–29)
Calcium: 8.9 mg/dL (ref 8.7–10.2)
Chloride: 102 mmol/L (ref 97–108)
Creatinine, Ser: 0.92 mg/dL (ref 0.76–1.27)
GFR calc Af Amer: 109 mL/min/{1.73_m2} (ref >59)
GFR calc non Af Amer: 94 mL/min/{1.73_m2} (ref >59)
Globulin, Total: 2.1 g/dL (ref 1.5–4.5)
Glucose: 98 mg/dL (ref 65–99)
Potassium: 4.3 mmol/L (ref 3.5–5.2)
Sodium: 142 mmol/L (ref 134–144)
Total Protein: 6.2 g/dL (ref 6.0–8.5)

## 2015-08-18 ENCOUNTER — Telehealth: Payer: Self-pay | Admitting: Family Medicine

## 2015-12-23 ENCOUNTER — Ambulatory Visit: Payer: BLUE CROSS/BLUE SHIELD | Admitting: Family Medicine

## 2016-01-08 ENCOUNTER — Ambulatory Visit (INDEPENDENT_AMBULATORY_CARE_PROVIDER_SITE_OTHER): Payer: BLUE CROSS/BLUE SHIELD | Admitting: Family Medicine

## 2016-01-08 ENCOUNTER — Encounter: Payer: Self-pay | Admitting: Family Medicine

## 2016-01-08 VITALS — BP 145/88 | HR 98 | Temp 98.6°F | Ht 68.0 in | Wt 235.0 lb

## 2016-01-08 DIAGNOSIS — J209 Acute bronchitis, unspecified: Secondary | ICD-10-CM | POA: Diagnosis not present

## 2016-01-08 MED ORDER — FLUTICASONE PROPIONATE 50 MCG/ACT NA SUSP: 1.0000 | Freq: Two times a day (BID) | NASAL | Status: DC | PRN

## 2016-01-08 MED ORDER — AZITHROMYCIN 250 MG PO TABS: ORAL_TABLET | ORAL | Status: DC

## 2016-01-08 MED ORDER — BENZONATATE 100 MG PO CAPS: 100.0000 mg | ORAL_CAPSULE | Freq: Two times a day (BID) | ORAL | Status: DC | PRN

## 2016-01-08 MED ORDER — ALBUTEROL SULFATE HFA 108 (90 BASE) MCG/ACT IN AERS: 2.0000 | INHALATION_SPRAY | Freq: Four times a day (QID) | RESPIRATORY_TRACT | Status: DC | PRN

## 2016-01-08 NOTE — Progress Notes (Signed)
BP 145/88 mmHg  Pulse 98  Temp(Src) 98.6 F (37 C) (Oral)  Ht 5\' 8"  (1.727 m)  Wt 235 lb (106.595 kg)  BMI 35.74 kg/m2   Subjective:    Patient ID: Carlos Miranda, male    DOB: Aug 16, 1961, 55 y.o.   MRN: [id number]  HPI: Carlos Miranda is a 55 y.o. male presenting on 01/08/2016 for URI   HPI Cough cold and runny nose and chest congestion Patient is been having a cough cold and congestion and postnasal drainage and chest congestion for the past 6 days. He feels like it is starting to get worse and go down into his chest. His cough is mostly dry and nonproductive. He denies any fevers or chills he denies any shortness of breath or wheezing. He denies any sick contacts that he knows of. He has been using both DayQuil and NyQuil and that helped him sleep but other than that not really helping much. Is also been having frontal headaches as well.  Relevant past medical, surgical, family and social history reviewed and updated as indicated. Interim medical history since our last visit reviewed. Allergies and medications reviewed and updated.  Review of Systems  Constitutional: Negative for fever and chills.  HENT: Positive for congestion, postnasal drip, rhinorrhea, sinus pressure, sneezing and sore throat. Negative for ear discharge, ear pain and voice change.   Eyes: Negative for pain, discharge, redness and visual disturbance.  Respiratory: Positive for cough. Negative for chest tightness, shortness of breath and wheezing.   Cardiovascular: Negative for chest pain and leg swelling.  Gastrointestinal: Negative for abdominal pain, diarrhea and constipation.  Genitourinary: Negative for difficulty urinating.  Musculoskeletal: Negative for back pain and gait problem.  Skin: Negative for rash.  Neurological: Negative for syncope, light-headedness and headaches.  All other systems reviewed and are negative.   Per HPI unless specifically indicated above     Medication List         This list is accurate as of: 01/08/16 10:31 AM.  Always use your most recent med list.               albuterol 108 (90 Base) MCG/ACT inhaler  Commonly known as:  PROVENTIL HFA;VENTOLIN HFA  Inhale 2 puffs into the lungs every 6 (six) hours as needed for wheezing or shortness of breath.     azithromycin 250 MG tablet  Commonly known as:  ZITHROMAX  Take 2 the first day and then one each day after.     benzonatate 100 MG capsule  Commonly known as:  TESSALON  Take 1 capsule (100 mg total) by mouth 2 (two) times daily as needed for cough.     fluticasone 50 MCG/ACT nasal spray  Commonly known as:  FLONASE  Place 1 spray into both nostrils 2 (two) times daily as needed for allergies or rhinitis.     lisinopril 10 MG tablet  Commonly known as:  PRINIVIL,ZESTRIL  Take 1 tablet (10 mg total) by mouth daily.     pravastatin 40 MG tablet  Commonly known as:  PRAVACHOL  Take 1 tablet (40 mg total) by mouth daily.           Objective:    BP 145/88 mmHg  Pulse 98  Temp(Src) 98.6 F (37 C) (Oral)  Ht 5\' 8"  (1.727 m)  Wt 235 lb (106.595 kg)  BMI 35.74 kg/m2  Wt Readings from Last 3 Encounters:  01/08/16 235 lb (106.595 kg)  06/03/15 238 lb (107.956 kg)  05/25/15 236 lb 4.8 oz (107.185 kg)    Physical Exam  Constitutional: He is oriented to person, place, and time. He appears well-developed and well-nourished. No distress.  HENT:  Right Ear: Tympanic membrane, external ear and ear canal normal.  Left Ear: Tympanic membrane, external ear and ear canal normal.  Nose: Mucosal edema and rhinorrhea present. No sinus tenderness. No epistaxis. Right sinus exhibits frontal sinus tenderness. Right sinus exhibits no maxillary sinus tenderness. Left sinus exhibits frontal sinus tenderness. Left sinus exhibits no maxillary sinus tenderness.  Mouth/Throat: Uvula is midline and mucous membranes are normal. Posterior oropharyngeal edema and posterior oropharyngeal erythema present. No  oropharyngeal exudate or tonsillar abscesses.  Eyes: Conjunctivae and EOM are normal. Pupils are equal, round, and reactive to light. Right eye exhibits no discharge. No scleral icterus.  Neck: Neck supple. No thyromegaly present.  Cardiovascular: Normal rate, regular rhythm, normal heart sounds and intact distal pulses.   No murmur heard. Pulmonary/Chest: Effort normal and breath sounds normal. No respiratory distress. He has no wheezes. He has no rales.  Musculoskeletal: Normal range of motion. He exhibits no edema.  Lymphadenopathy:    He has no cervical adenopathy.  Neurological: He is alert and oriented to person, place, and time. Coordination normal.  Skin: Skin is warm and dry. No rash noted. He is not diaphoretic.  Psychiatric: He has a normal mood and affect. His behavior is normal.  Nursing note and vitals reviewed.     Assessment & Plan:   Problem List Items Addressed This Visit    None    Visit Diagnoses    Acute bronchitis, unspecified organism    -  Primary    Relevant Medications    albuterol (PROVENTIL HFA;VENTOLIN HFA) 108 (90 Base) MCG/ACT inhaler    azithromycin (ZITHROMAX) 250 MG tablet    fluticasone (FLONASE) 50 MCG/ACT nasal spray    benzonatate (TESSALON) 100 MG capsule        Follow up plan: Return if symptoms worsen or fail to improve.  Counseling provided for all of the vaccine components No orders of the defined types were placed in this encounter.    Caryl Pina, MD [facility] 01/08/2016, 10:31 AM

## 2016-05-08 ENCOUNTER — Other Ambulatory Visit: Payer: Self-pay | Admitting: Family Medicine

## 2016-05-28 ENCOUNTER — Other Ambulatory Visit: Payer: Self-pay | Admitting: Family Medicine

## 2016-05-28 DIAGNOSIS — E785 Hyperlipidemia, unspecified: Secondary | ICD-10-CM

## 2016-08-06 ENCOUNTER — Other Ambulatory Visit: Payer: Self-pay | Admitting: Family Medicine

## 2016-08-28 ENCOUNTER — Other Ambulatory Visit: Payer: Self-pay | Admitting: Family Medicine

## 2016-08-28 DIAGNOSIS — E785 Hyperlipidemia, unspecified: Secondary | ICD-10-CM

## 2016-10-02 ENCOUNTER — Ambulatory Visit (INDEPENDENT_AMBULATORY_CARE_PROVIDER_SITE_OTHER): Payer: BLUE CROSS/BLUE SHIELD | Admitting: Family Medicine

## 2016-10-02 ENCOUNTER — Encounter: Payer: Self-pay | Admitting: Family Medicine

## 2016-10-02 VITALS — BP 128/82 | HR 68 | Temp 96.8°F | Ht 68.0 in | Wt 234.6 lb

## 2016-10-02 DIAGNOSIS — I10 Essential (primary) hypertension: Secondary | ICD-10-CM

## 2016-10-02 DIAGNOSIS — Z6835 Body mass index (BMI) 35.0-35.9, adult: Secondary | ICD-10-CM | POA: Diagnosis not present

## 2016-10-02 DIAGNOSIS — E785 Hyperlipidemia, unspecified: Secondary | ICD-10-CM

## 2016-10-02 DIAGNOSIS — IMO0001 Reserved for inherently not codable concepts without codable children: Secondary | ICD-10-CM

## 2016-10-02 DIAGNOSIS — Z0000 Encounter for general adult medical examination without abnormal findings: Secondary | ICD-10-CM

## 2016-10-02 DIAGNOSIS — E669 Obesity, unspecified: Secondary | ICD-10-CM | POA: Diagnosis not present

## 2016-10-02 LAB — BAYER DCA HB A1C WAIVED: HB A1C (BAYER DCA - WAIVED): 5.8 % (ref <7.0)

## 2016-10-02 MED ORDER — PRAVASTATIN SODIUM 40 MG PO TABS: 40.0000 mg | ORAL_TABLET | Freq: Every day | ORAL | 3 refills | Status: DC

## 2016-10-02 MED ORDER — LISINOPRIL 10 MG PO TABS: 10.0000 mg | ORAL_TABLET | Freq: Every day | ORAL | 3 refills | Status: DC

## 2016-10-02 NOTE — Progress Notes (Signed)
   HPI  Patient presents today for physical exam and for follow-up of chronic medical conditions  Hypertension Good medication compliance, no chest pain, dyspnea, palpitations, leg edema, routine headache.  Hyperlipidemia Not watching diet, not exercising Good medication compliance, needs refills.  Patient works at [employer], he is occupationally active but does not have any routine or size routine  PMH: Smoking status noted ROS: Per HPI  Objective: BP 128/82   Pulse 68   Temp (!) 96.8 F (36 C) (Oral)   Ht '5\' 8"'$  (1.727 m)   Wt 234 lb 9.6 oz (106.4 kg)   BMI 35.67 kg/m  Gen: NAD, alert, cooperative with exam HEENT: NCAT, EOMI, PERRL, right TM within normal limits, left TM obscured by cerumen, oropharynx clear, nares clear CV: RRR, good S1/S2, no murmur Resp: CTABL, no wheezes, non-labored Abd: SNTND, BS present, no guarding or organomegaly Ext: No edema, warm Neuro: Alert and oriented, strength 5/5 and sensation intact in bilateral lower extremities  Assessment and plan:  # Annual physical exam Normal exam except for obesity and hypertension. Discussed therapeutic lifestyle changes Labs, nonfasting  # Hypertension Well-controlled on very low dose lisinopril Refilled 1 year, basic labs today  # Hyperlipidemia Repeating labs, discussed therapeutic lifestyle changes  # Obesity A1c including labs, discussed therapeutic lifestyle changes    Orders Placed This Encounter  Procedures  . Bayer DCA Hb A1c Waived  . CMP14+EGFR  . CBC with Differential/Platelet  . Lipid panel  . PSA    Meds ordered this encounter  Medications  . pravastatin (PRAVACHOL) 40 MG tablet    Sig: Take 1 tablet (40 mg total) by mouth daily.    Dispense:  90 tablet    Refill:  3  . lisinopril (PRINIVIL,ZESTRIL) 10 MG tablet    Sig: Take 1 tablet (10 mg total) by mouth daily.    Dispense:  90 tablet    Refill:  3    Patient states he's had a flu shot already this  year  Laroy Apple, MD [facility] 10/02/2016, 10:49 AM

## 2016-10-02 NOTE — Patient Instructions (Addendum)
Great to see you!  We will call with labs within 1 week.   Lets plan on seeing you in 1 year unless you need [place] sooner.   Health Maintenance, Male A healthy lifestyle and preventative care can promote health and wellness.  Maintain regular health, dental, and eye exams.  Eat a healthy diet. Foods like vegetables, fruits, whole grains, low-fat dairy products, and lean protein foods contain the nutrients you need and are low in calories. Decrease your intake of foods high in solid fats, added sugars, and salt. Get information about a proper diet from your health care provider, if necessary.  Regular physical exercise is one of the most important things you can do for your health. Most adults should get at least 150 minutes of moderate-intensity exercise (any activity that increases your heart rate and causes you to sweat) each week. In addition, most adults need muscle-strengthening exercises on 2 or more days a week.   Maintain a healthy weight. The body mass index (BMI) is a screening tool to identify possible weight problems. It provides an estimate of body fat based on height and weight. Your health care provider can find your BMI and can help you achieve or maintain a healthy weight. For males 20 years and older:  A BMI below 18.5 is considered underweight.  A BMI of 18.5 to 24.9 is normal.  A BMI of 25 to 29.9 is considered overweight.  A BMI of 30 and above is considered obese.  Maintain normal blood lipids and cholesterol by exercising and minimizing your intake of saturated fat. Eat a balanced diet with plenty of fruits and vegetables. Blood tests for lipids and cholesterol should begin at age 62 and be repeated every 5 years. If your lipid or cholesterol levels are high, you are over age 8, or you are at high risk for heart disease, you may need your cholesterol levels checked more frequently.Ongoing high lipid and cholesterol levels should be treated with medicines if diet and  exercise are not working.  If you smoke, find out from your health care provider how to quit. If you do not use tobacco, do not start.  Lung cancer screening is recommended for adults aged 44-80 years who are at high risk for developing lung cancer because of a history of smoking. A yearly low-dose CT scan of the lungs is recommended for people who have at least a 30-pack-year history of smoking and are current smokers or have quit within the past 15 years. A pack year of smoking is smoking an average of 1 pack of cigarettes a day for 1 year (for example, a 30-pack-year history of smoking could mean smoking 1 pack a day for 30 years or 2 packs a day for 15 years). Yearly screening should continue until the smoker has stopped smoking for at least 15 years. Yearly screening should be stopped for people who develop a health problem that would prevent them from having lung cancer treatment.  If you choose to drink alcohol, do not have more than 2 drinks per day. One drink is considered to be 12 oz (360 mL) of beer, 5 oz (150 mL) of wine, or 1.5 oz (45 mL) of liquor.  Avoid the use of street drugs. Do not share needles with anyone. Ask for help if you need support or instructions about stopping the use of drugs.  High blood pressure causes heart disease and increases the risk of stroke. High blood pressure is more likely to develop in:  People who have blood pressure in the end of the normal range (100-139/85-89 mm Hg).  People who are overweight or obese.  People who are African American.  If you are 81-71 years of age, have your blood pressure checked every 3-5 years. If you are 70 years of age or older, have your blood pressure checked every year. You should have your blood pressure measured twice-once when you are at a hospital or clinic, and once when you are not at a hospital or clinic. Record the average of the two measurements. To check your blood pressure when you are not at a hospital or  clinic, you can use:  An automated blood pressure machine at a pharmacy.  A home blood pressure monitor.  If you are 54-57 years old, ask your health care provider if you should take aspirin to prevent heart disease.  Diabetes screening involves taking a blood sample to check your fasting blood sugar level. This should be done once every 3 years after age 70 if you are at a normal weight and without risk factors for diabetes. Testing should be considered at a younger age or be carried out more frequently if you are overweight and have at least 1 risk factor for diabetes.  Colorectal cancer can be detected and often prevented. Most routine colorectal cancer screening begins at the age of 20 and continues through age 69. However, your health care provider may recommend screening at an earlier age if you have risk factors for colon cancer. On a yearly basis, your health care provider may provide home test kits to check for hidden blood in the stool. A small camera at the end of a tube may be used to directly examine the colon (sigmoidoscopy or colonoscopy) to detect the earliest forms of colorectal cancer. Talk to your health care provider about this at age 76 when routine screening begins. A direct exam of the colon should be repeated every 5-10 years through age 76, unless early forms of precancerous polyps or small growths are found.  People who are at an increased risk for hepatitis B should be screened for this virus. You are considered at high risk for hepatitis B if:  You were born in a country where hepatitis B occurs often. Talk with your health care provider about which countries are considered high risk.  Your parents were born in a high-risk country and you have not received a shot to protect against hepatitis B (hepatitis B vaccine).  You have HIV or AIDS.  You use needles to inject street drugs.  You live with, or have sex with, someone who has hepatitis B.  You are a man who has  sex with other men (MSM).  You get hemodialysis treatment.  You take certain medicines for conditions like cancer, organ transplantation, and autoimmune conditions.  Hepatitis C blood testing is recommended for all people born from [birth year] through 1965 and any individual with known risk factors for hepatitis C.  Healthy men should no longer receive prostate-specific antigen (PSA) blood tests as part of routine cancer screening. Talk to your health care provider about prostate cancer screening.  Testicular cancer screening is not recommended for adolescents or adult males who have no symptoms. Screening includes self-exam, a health care provider exam, and other screening tests. Consult with your health care provider about any symptoms you have or any concerns you have about testicular cancer.  Practice safe sex. Use condoms and avoid high-risk sexual practices to reduce the spread of  sexually transmitted infections (STIs).  You should be screened for STIs, including gonorrhea and chlamydia if:  You are sexually active and are younger than 24 years.  You are older than 24 years, and your health care provider tells you that you are at risk for this type of infection.  Your sexual activity has changed since you were last screened, and you are at an increased risk for chlamydia or gonorrhea. Ask your health care provider if you are at risk.  If you are at risk of being infected with HIV, it is recommended that you take a prescription medicine daily to prevent HIV infection. This is called pre-exposure prophylaxis (PrEP). You are considered at risk if:  You are a man who has sex with other men (MSM).  You are a heterosexual man who is sexually active with multiple partners.  You take drugs by injection.  You are sexually active with a partner who has HIV.  Talk with your health care provider about whether you are at high risk of being infected with HIV. If you choose to begin PrEP, you should  first be tested for HIV. You should then be tested every 3 months for as long as you are taking PrEP.  Use sunscreen. Apply sunscreen liberally and repeatedly throughout the day. You should seek shade when your shadow is shorter than you. Protect yourself by wearing long sleeves, pants, a wide-brimmed hat, and sunglasses year round whenever you are outdoors.  Tell your health care provider of new moles or changes in moles, especially if there is a change in shape or color. Also, tell your health care provider if a mole is larger than the size of a pencil eraser.  A one-time screening for abdominal aortic aneurysm (AAA) and surgical repair of large AAAs by ultrasound is recommended for men aged 74-75 years who are current or former smokers.  Stay current with your vaccines (immunizations). This information is not intended to replace advice given to you by your health care provider. Make sure you discuss any questions you have with your health care provider. Document Released: 04/13/2008 Document Revised: 11/06/2014 Document Reviewed: 07/20/2015 Elsevier Interactive Patient Education  2017 [employer].

## 2016-10-03 LAB — LIPID PANEL
Chol/HDL Ratio: 4.1 ratio units (ref 0.0–5.0)
Cholesterol, Total: 172 mg/dL (ref 100–199)
HDL: 42 mg/dL (ref >39)
LDL Calculated: 99 mg/dL (ref 0–99)
Triglycerides: 153 mg/dL — ABNORMAL HIGH (ref 0–149)
VLDL Cholesterol Cal: 31 mg/dL (ref 5–40)

## 2016-10-03 LAB — CBC WITH DIFFERENTIAL/PLATELET
Basophils Absolute: 0 10*3/uL (ref 0.0–0.2)
Basos: 1 %
EOS (ABSOLUTE): 0.3 10*3/uL (ref 0.0–0.4)
Eos: 5 %
Hematocrit: 49.1 % (ref 37.5–51.0)
Hemoglobin: 16.9 g/dL (ref 13.0–17.7)
Immature Grans (Abs): 0 10*3/uL (ref 0.0–0.1)
Immature Granulocytes: 0 %
Lymphocytes Absolute: 2 10*3/uL (ref 0.7–3.1)
Lymphs: 38 %
MCH: 31.5 pg (ref 26.6–33.0)
MCHC: 34.4 g/dL (ref 31.5–35.7)
MCV: 91 fL (ref 79–97)
Monocytes Absolute: 0.4 10*3/uL (ref 0.1–0.9)
Monocytes: 8 %
Neutrophils Absolute: 2.5 10*3/uL (ref 1.4–7.0)
Neutrophils: 48 %
Platelets: 229 10*3/uL (ref 150–379)
RBC: 5.37 x10E6/uL (ref 4.14–5.80)
RDW: 13.8 % (ref 12.3–15.4)
WBC: 5.3 10*3/uL (ref 3.4–10.8)

## 2016-10-03 LAB — CMP14+EGFR
ALT: 23 IU/L (ref 0–44)
AST: 21 IU/L (ref 0–40)
Albumin/Globulin Ratio: 1.7 (ref 1.2–2.2)
Albumin: 4.4 g/dL (ref 3.5–5.5)
Alkaline Phosphatase: 103 IU/L (ref 39–117)
BUN/Creatinine Ratio: 13 (ref 9–20)
BUN: 13 mg/dL (ref 6–24)
Bilirubin Total: 0.6 mg/dL (ref 0.0–1.2)
CO2: 25 mmol/L (ref 18–29)
Calcium: 9.3 mg/dL (ref 8.7–10.2)
Chloride: 100 mmol/L (ref 96–106)
Creatinine, Ser: 1.01 mg/dL (ref 0.76–1.27)
GFR calc Af Amer: 96 mL/min/{1.73_m2} (ref >59)
GFR calc non Af Amer: 83 mL/min/{1.73_m2} (ref >59)
Globulin, Total: 2.6 g/dL (ref 1.5–4.5)
Glucose: 106 mg/dL — ABNORMAL HIGH (ref 65–99)
Potassium: 4.3 mmol/L (ref 3.5–5.2)
Sodium: 139 mmol/L (ref 134–144)
Total Protein: 7 g/dL (ref 6.0–8.5)

## 2016-10-03 LAB — PSA: Prostate Specific Ag, Serum: 0.4 ng/mL (ref 0.0–4.0)

## 2016-10-19 ENCOUNTER — Encounter: Payer: Self-pay | Admitting: *Deleted

## 2016-11-21 ENCOUNTER — Ambulatory Visit (INDEPENDENT_AMBULATORY_CARE_PROVIDER_SITE_OTHER): Payer: BLUE CROSS/BLUE SHIELD | Admitting: Family Medicine

## 2016-11-21 ENCOUNTER — Encounter: Payer: Self-pay | Admitting: Family Medicine

## 2016-11-21 VITALS — BP 137/85 | HR 95 | Temp 96.9°F | Ht 68.0 in | Wt 241.2 lb

## 2016-11-21 DIAGNOSIS — J0100 Acute maxillary sinusitis, unspecified: Secondary | ICD-10-CM | POA: Diagnosis not present

## 2016-11-21 DIAGNOSIS — M25522 Pain in left elbow: Secondary | ICD-10-CM

## 2016-11-21 DIAGNOSIS — M25521 Pain in right elbow: Secondary | ICD-10-CM

## 2016-11-21 DIAGNOSIS — M2550 Pain in unspecified joint: Secondary | ICD-10-CM

## 2016-11-21 DIAGNOSIS — M25561 Pain in right knee: Secondary | ICD-10-CM | POA: Diagnosis not present

## 2016-11-21 DIAGNOSIS — M25562 Pain in left knee: Secondary | ICD-10-CM

## 2016-11-21 MED ORDER — DOXYCYCLINE HYCLATE 100 MG PO TABS: 100.0000 mg | ORAL_TABLET | Freq: Two times a day (BID) | ORAL | 0 refills | Status: DC

## 2016-11-21 MED ORDER — METHYLPREDNISOLONE ACETATE 80 MG/ML IJ SUSP
80.0000 mg | Freq: Once | INTRAMUSCULAR | Status: AC
  Administered 2016-11-21: 80 mg via INTRAMUSCULAR

## 2016-11-21 NOTE — Patient Instructions (Signed)
Great to see you!  We will call with labs within 1 week.   Please let us know if anything gets worse or you have any new concerns   Sinusitis, Adult Sinusitis is soreness and inflammation of your sinuses. Sinuses are hollow spaces in the bones around your face. They are located:  Around your eyes.  In the middle of your forehead.  Behind your nose.  In your cheekbones. Your sinuses and nasal passages are lined with a stringy fluid (mucus). Mucus normally drains out of your sinuses. When your nasal tissues get inflamed or swollen, the mucus can get trapped or blocked so air cannot flow through your sinuses. This lets bacteria, viruses, and funguses grow, and that leads to infection. Follow these instructions at home: Medicines  Take, use, or apply over-the-counter and prescription medicines only as told by your doctor. These may include nasal sprays.  If you were prescribed an antibiotic medicine, take it as told by your doctor. Do not stop taking the antibiotic even if you start to feel better. Hydrate and Humidify  Drink enough water to keep your pee (urine) clear or pale yellow.  Use a cool mist humidifier to keep the humidity level in your home above 50%.  Breathe in steam for 10-15 minutes, 3-4 times a day or as told by your doctor. You can do this in the bathroom while a hot shower is running.  Try not to spend time in cool or dry air. Rest  Rest as much as possible.  Sleep with your head raised (elevated).  Make sure to get enough sleep each night. General instructions  Put a warm, moist washcloth on your face 3-4 times a day or as told by your doctor. This will help with discomfort.  Wash your hands often with soap and water. If there is no soap and water, use hand sanitizer.  Do not smoke. Avoid being around people who are smoking (secondhand smoke).  Keep all follow-up visits as told by your doctor. This is important. Contact a doctor if:  You have a  fever.  Your symptoms get worse.  Your symptoms do not get better within 10 days. Get help right away if:  You have a very bad headache.  You cannot stop throwing up (vomiting).  You have pain or swelling around your face or eyes.  You have trouble seeing.  You feel confused.  Your neck is stiff.  You have trouble breathing. This information is not intended to replace advice given to you by your health care provider. Make sure you discuss any questions you have with your health care provider. Document Released: 04/03/2008 Document Revised: 06/11/2016 Document Reviewed: 08/11/2015 Elsevier Interactive Patient Education  2017 [employer].

## 2016-11-21 NOTE — Progress Notes (Signed)
   HPI  Patient presents today here with polyarthralgia and sinus concerns.  Patient explains that yesterday morning he woke up noticing a deep-seated bilateral knee pain, and improved with ibuprofen and walking throughout the day. They came back last night en route improved much slower to ibuprofen. It's back today. Midway through that he also noticed bilateral deep seated similar pain in the elbows.  He denies any swelling of the joints, fever, chills, sweats. Denies any dyspnea, cough, chest pain, more difficult to tolerate foods and fluids.  He's also had 7-8 days of bilateral maxillary pressure and pain. He's had intermittent nasal congestion as well.  Eyes any tick bites and declines any Testing today.  PMH: Smoking status noted ROS: Per HPI  Objective: BP 137/85   Pulse 95   Temp (!) 96.9 F (36.1 C) (Oral)   Ht '5\' 8"'$  (1.727 m)   Wt 241 lb 3.2 oz (109.4 kg)   BMI 36.67 kg/m  Gen: NAD, alert, cooperative with exam HEENT: NCAT, oromucosa moist and clear, mild pressure with palpation of maxillary sinuses bilaterally, TMs normal bilaterally CV: RRR, good S1/S2, no murmur Resp: CTABL, no wheezes, non-labored Ext: No edema, warm Neuro: Alert and oriented, No gross deficits  MSK: No erythema or effusions of the bilateral elbows or knees.  Assessment and plan:  # Acute sinusitis, polyarthralgia Unusual presentation, patient does have signs and symptoms of acute sinusitis which are covered for with doxycycline and IM Depo-Medrol. IM Depo-Medrol Sirs a dual purpose, this is inflammatory arthritis he should have good improvement of symptoms. Labs today including CBC, CMP, sedimentation rate If symptoms return as IM Depo-Medrol leaves in 1-2 weeks would recommend further evaluation for possible rheumatologic disease, however this is very unlikely given onset 2 days ago the age of 23.  Other considerations with polyarthralgia would include disseminated gonococcal infection  unlikely- low risk) Acute rheumatic fever ( unlikely- note no murmur, nodules), RA to name a few. Most likely transient viral syndrome. Unlikely flu     Orders Placed This Encounter  Procedures  . Sedimentation rate  . CBC with Differential/Platelet  . CMP14+EGFR    Meds ordered this encounter  Medications  . doxycycline (VIBRA-TABS) 100 MG tablet    Sig: Take 1 tablet (100 mg total) by mouth 2 (two) times daily. 1 po bid    Dispense:  20 tablet    Refill:  0    Laroy Apple, MD [facility] 11/21/2016, 10:05 AM

## 2016-11-22 LAB — CBC WITH DIFFERENTIAL/PLATELET
Basophils Absolute: 0 10*3/uL (ref 0.0–0.2)
Basos: 1 %
EOS (ABSOLUTE): 0.3 10*3/uL (ref 0.0–0.4)
Eos: 6 %
Hematocrit: 49.6 % (ref 37.5–51.0)
Hemoglobin: 16.5 g/dL (ref 13.0–17.7)
Immature Grans (Abs): 0 10*3/uL (ref 0.0–0.1)
Immature Granulocytes: 0 %
Lymphocytes Absolute: 1.9 10*3/uL (ref 0.7–3.1)
Lymphs: 34 %
MCH: 31.4 pg (ref 26.6–33.0)
MCHC: 33.3 g/dL (ref 31.5–35.7)
MCV: 94 fL (ref 79–97)
Monocytes Absolute: 0.5 10*3/uL (ref 0.1–0.9)
Monocytes: 9 %
Neutrophils Absolute: 2.7 10*3/uL (ref 1.4–7.0)
Neutrophils: 50 %
Platelets: 213 10*3/uL (ref 150–379)
RBC: 5.26 x10E6/uL (ref 4.14–5.80)
RDW: 13.7 % (ref 12.3–15.4)
WBC: 5.4 10*3/uL (ref 3.4–10.8)

## 2016-11-22 LAB — CMP14+EGFR
ALT: 26 IU/L (ref 0–44)
AST: 18 IU/L (ref 0–40)
Albumin/Globulin Ratio: 1.6 (ref 1.2–2.2)
Albumin: 4.2 g/dL (ref 3.5–5.5)
Alkaline Phosphatase: 101 IU/L (ref 39–117)
BUN/Creatinine Ratio: 11 (ref 9–20)
BUN: 10 mg/dL (ref 6–24)
Bilirubin Total: 0.4 mg/dL (ref 0.0–1.2)
CO2: 23 mmol/L (ref 18–29)
Calcium: 9.2 mg/dL (ref 8.7–10.2)
Chloride: 101 mmol/L (ref 96–106)
Creatinine, Ser: 0.94 mg/dL (ref 0.76–1.27)
GFR calc Af Amer: 105 mL/min/{1.73_m2} (ref >59)
GFR calc non Af Amer: 91 mL/min/{1.73_m2} (ref >59)
Globulin, Total: 2.6 g/dL (ref 1.5–4.5)
Glucose: 82 mg/dL (ref 65–99)
Potassium: 4.3 mmol/L (ref 3.5–5.2)
Sodium: 142 mmol/L (ref 134–144)
Total Protein: 6.8 g/dL (ref 6.0–8.5)

## 2016-11-22 LAB — SEDIMENTATION RATE: Sed Rate: 3 mm/hr (ref 0–30)

## 2017-09-30 ENCOUNTER — Other Ambulatory Visit: Payer: Self-pay | Admitting: Family Medicine

## 2017-09-30 DIAGNOSIS — E785 Hyperlipidemia, unspecified: Secondary | ICD-10-CM

## 2017-10-01 NOTE — Telephone Encounter (Signed)
Last seen 11/21/16  Dr Wendi Snipes

## 2017-12-30 ENCOUNTER — Other Ambulatory Visit: Payer: Self-pay | Admitting: Family Medicine

## 2017-12-30 DIAGNOSIS — E785 Hyperlipidemia, unspecified: Secondary | ICD-10-CM

## 2017-12-31 NOTE — Telephone Encounter (Signed)
Last lipid 10/02/16 

## 2017-12-31 NOTE — Telephone Encounter (Signed)
Left detailed message per dpr  

## 2018-01-07 ENCOUNTER — Ambulatory Visit (INDEPENDENT_AMBULATORY_CARE_PROVIDER_SITE_OTHER): Payer: BLUE CROSS/BLUE SHIELD | Admitting: Physician Assistant

## 2018-01-07 ENCOUNTER — Encounter: Payer: Self-pay | Admitting: Physician Assistant

## 2018-01-07 VITALS — BP 117/82 | HR 99 | Temp 97.0°F | Ht 68.0 in | Wt 244.8 lb

## 2018-01-07 DIAGNOSIS — E785 Hyperlipidemia, unspecified: Secondary | ICD-10-CM | POA: Diagnosis not present

## 2018-01-07 DIAGNOSIS — H65112 Acute and subacute allergic otitis media (mucoid) (sanguinous) (serous), left ear: Secondary | ICD-10-CM | POA: Diagnosis not present

## 2018-01-07 MED ORDER — LISINOPRIL 10 MG PO TABS: 10.0000 mg | ORAL_TABLET | Freq: Every day | ORAL | 0 refills | Status: DC

## 2018-01-07 MED ORDER — PRAVASTATIN SODIUM 40 MG PO TABS: 40.0000 mg | ORAL_TABLET | Freq: Every day | ORAL | 0 refills | Status: DC

## 2018-01-07 MED ORDER — AZITHROMYCIN 250 MG PO TABS: ORAL_TABLET | ORAL | 0 refills | Status: DC

## 2018-01-08 NOTE — Progress Notes (Signed)
BP 117/82   Pulse 99   Temp (!) 97 F (36.1 C) (Oral)   Ht 5\' 8"  (1.727 m)   Wt 244 lb 12.8 oz (111 kg)   BMI 37.22 kg/m    Subjective:    Patient ID: Carlos Miranda, male    DOB: August 19, 1961, 57 y.o.   MRN: [id number]  HPI: Carlos Miranda is a 57 y.o. male presenting on 01/07/2018 for Ear Pain (left ); Sinus Problem; and Sinusitis  He has pain, swelling and tenderness on the left ear. This patient has had many days of sinus headache and postnasal drainage. There is copious drainage at times. Denies any fever at this time. There has been a history of sinus infections in the past.  No history of sinus surgery. There is cough at night. It has become more prevalent in recent days. Needs refills one time until he can come and get labs and an appointmentwith Dr. Wendi Snipes.  Past Medical History:  Diagnosis Date  . Deafness in left ear   . History of meningitis 6 months old  . Hyperlipidemia   . Hypertension    Relevant past medical, surgical, family and social history reviewed and updated as indicated. Interim medical history since our last visit reviewed. Allergies and medications reviewed and updated. DATA REVIEWED: CHART IN EPIC  Family History reviewed for pertinent findings.  Review of Systems  Constitutional: Positive for fatigue. Negative for appetite change.  HENT: Positive for sinus pressure and sore throat.   Eyes: Negative.  Negative for pain and visual disturbance.  Respiratory: Positive for shortness of breath and wheezing. Negative for cough and chest tightness.   Cardiovascular: Negative.  Negative for chest pain, palpitations and leg swelling.  Gastrointestinal: Negative.  Negative for abdominal pain, diarrhea, nausea and vomiting.  Endocrine: Negative.   Genitourinary: Negative.   Musculoskeletal: Positive for back pain and myalgias.  Skin: Negative.  Negative for color change and rash.  Neurological: Positive for headaches. Negative for weakness and numbness.    Psychiatric/Behavioral: Negative.     Allergies as of 01/07/2018      Reactions   Lasix [furosemide]    Penicillins    Streptomycin    Avoid streptomycin, neomycin, and kanamycin due to deafness in one ear from meninigitis   Sulfa Antibiotics       Medication List        Accurate as of 01/07/18 11:59 PM. Always use your most recent med list.          azithromycin 250 MG tablet Commonly known as:  ZITHROMAX Z-PAK Take as directed   lisinopril 10 MG tablet Commonly known as:  PRINIVIL,ZESTRIL Take 1 tablet (10 mg total) by mouth daily.   pravastatin 40 MG tablet Commonly known as:  PRAVACHOL Take 1 tablet (40 mg total) by mouth daily.          Objective:    BP 117/82   Pulse 99   Temp (!) 97 F (36.1 C) (Oral)   Ht 5\' 8"  (1.727 m)   Wt 244 lb 12.8 oz (111 kg)   BMI 37.22 kg/m   Allergies  Allergen Reactions  . Lasix [Furosemide]   . Penicillins   . Streptomycin     Avoid streptomycin, neomycin, and kanamycin due to deafness in one ear from meninigitis  . Sulfa Antibiotics     Wt Readings from Last 3 Encounters:  01/07/18 244 lb 12.8 oz (111 kg)  11/21/16 241 lb 3.2 oz (109.4 kg)  10/02/16 234 lb 9.6 oz (106.4 kg)    Physical Exam  Constitutional: He is oriented to person, place, and time. He appears well-developed and well-nourished.  HENT:  Head: Normocephalic and atraumatic.  Right Ear: Tympanic membrane and external ear normal. No middle ear effusion.  Left Ear: External ear normal. Tympanic membrane is erythematous. A middle ear effusion is present.  Nose: Mucosal edema and rhinorrhea present. Right sinus exhibits no maxillary sinus tenderness. Left sinus exhibits no maxillary sinus tenderness.  Mouth/Throat: Uvula is midline. Posterior oropharyngeal erythema present.  Eyes: Conjunctivae and EOM are normal. Pupils are equal, round, and reactive to light. Right eye exhibits no discharge. Left eye exhibits no discharge.  Neck: Normal range of  motion.  Cardiovascular: Normal rate, regular rhythm and normal heart sounds.  Pulmonary/Chest: Effort normal and breath sounds normal. No respiratory distress. He has no wheezes.  Abdominal: Soft.  Lymphadenopathy:    He has no cervical adenopathy.  Neurological: He is alert and oriented to person, place, and time.  Skin: Skin is warm and dry.  Psychiatric: He has a normal mood and affect.        Assessment & Plan:   1. Hyperlipidemia, unspecified hyperlipidemia type - pravastatin (PRAVACHOL) 40 MG tablet; Take 1 tablet (40 mg total) by mouth daily.  Dispense: 90 tablet; Refill: 0  2. Acute mucoid otitis media of left ear - azithromycin (ZITHROMAX Z-PAK) 250 MG tablet; Take as directed  Dispense: 6 each; Refill: 0   Continue all other maintenance medications as listed above.  Follow up plan: Return in about 3 months (around 04/09/2018) for  [facility], check and labs.  Educational handout given for [place] PA-C [place] [address]  [place], [place] [address] [phone]   01/08/2018, 11:01 PM

## 2018-01-23 DIAGNOSIS — M9903 Segmental and somatic dysfunction of lumbar region: Secondary | ICD-10-CM | POA: Diagnosis not present

## 2018-01-24 DIAGNOSIS — M9903 Segmental and somatic dysfunction of lumbar region: Secondary | ICD-10-CM | POA: Diagnosis not present

## 2018-01-28 DIAGNOSIS — M9903 Segmental and somatic dysfunction of lumbar region: Secondary | ICD-10-CM | POA: Diagnosis not present

## 2018-01-30 DIAGNOSIS — M9903 Segmental and somatic dysfunction of lumbar region: Secondary | ICD-10-CM | POA: Diagnosis not present

## 2018-01-31 DIAGNOSIS — M9903 Segmental and somatic dysfunction of lumbar region: Secondary | ICD-10-CM | POA: Diagnosis not present

## 2018-02-04 DIAGNOSIS — M9903 Segmental and somatic dysfunction of lumbar region: Secondary | ICD-10-CM | POA: Diagnosis not present

## 2018-02-06 DIAGNOSIS — M9903 Segmental and somatic dysfunction of lumbar region: Secondary | ICD-10-CM | POA: Diagnosis not present

## 2018-02-07 DIAGNOSIS — M9903 Segmental and somatic dysfunction of lumbar region: Secondary | ICD-10-CM | POA: Diagnosis not present

## 2018-02-14 DIAGNOSIS — M9903 Segmental and somatic dysfunction of lumbar region: Secondary | ICD-10-CM | POA: Diagnosis not present

## 2018-02-18 DIAGNOSIS — M9903 Segmental and somatic dysfunction of lumbar region: Secondary | ICD-10-CM | POA: Diagnosis not present

## 2018-02-20 DIAGNOSIS — M9903 Segmental and somatic dysfunction of lumbar region: Secondary | ICD-10-CM | POA: Diagnosis not present

## 2018-02-21 DIAGNOSIS — M9903 Segmental and somatic dysfunction of lumbar region: Secondary | ICD-10-CM | POA: Diagnosis not present

## 2018-02-25 DIAGNOSIS — M9903 Segmental and somatic dysfunction of lumbar region: Secondary | ICD-10-CM | POA: Diagnosis not present

## 2018-02-28 DIAGNOSIS — M9903 Segmental and somatic dysfunction of lumbar region: Secondary | ICD-10-CM | POA: Diagnosis not present

## 2018-03-04 DIAGNOSIS — M9903 Segmental and somatic dysfunction of lumbar region: Secondary | ICD-10-CM | POA: Diagnosis not present

## 2018-03-07 DIAGNOSIS — M9903 Segmental and somatic dysfunction of lumbar region: Secondary | ICD-10-CM | POA: Diagnosis not present

## 2018-03-13 DIAGNOSIS — M9903 Segmental and somatic dysfunction of lumbar region: Secondary | ICD-10-CM | POA: Diagnosis not present

## 2018-03-20 DIAGNOSIS — M9903 Segmental and somatic dysfunction of lumbar region: Secondary | ICD-10-CM | POA: Diagnosis not present

## 2018-03-27 DIAGNOSIS — M9903 Segmental and somatic dysfunction of lumbar region: Secondary | ICD-10-CM | POA: Diagnosis not present

## 2018-04-01 ENCOUNTER — Other Ambulatory Visit: Payer: Self-pay | Admitting: Family Medicine

## 2018-04-01 ENCOUNTER — Other Ambulatory Visit: Payer: Self-pay | Admitting: *Deleted

## 2018-04-01 DIAGNOSIS — E785 Hyperlipidemia, unspecified: Secondary | ICD-10-CM

## 2018-04-01 DIAGNOSIS — R7309 Other abnormal glucose: Secondary | ICD-10-CM

## 2018-04-01 MED ORDER — PRAVASTATIN SODIUM 40 MG PO TABS: 40.0000 mg | ORAL_TABLET | Freq: Every day | ORAL | 3 refills | Status: DC

## 2018-04-01 MED ORDER — LISINOPRIL 10 MG PO TABS: 10.0000 mg | ORAL_TABLET | Freq: Every day | ORAL | 3 refills | Status: DC

## 2018-04-03 DIAGNOSIS — M9903 Segmental and somatic dysfunction of lumbar region: Secondary | ICD-10-CM | POA: Diagnosis not present

## 2018-04-09 ENCOUNTER — Other Ambulatory Visit: Payer: BLUE CROSS/BLUE SHIELD

## 2018-04-09 ENCOUNTER — Other Ambulatory Visit: Payer: Self-pay

## 2018-04-09 DIAGNOSIS — E785 Hyperlipidemia, unspecified: Secondary | ICD-10-CM

## 2018-04-09 DIAGNOSIS — R7309 Other abnormal glucose: Secondary | ICD-10-CM

## 2018-04-09 LAB — CMP14+EGFR
ALT: 25 IU/L (ref 0–44)
AST: 21 IU/L (ref 0–40)
Albumin/Globulin Ratio: 1.8 (ref 1.2–2.2)
Albumin: 4.2 g/dL (ref 3.5–5.5)
Alkaline Phosphatase: 88 IU/L (ref 39–117)
BUN/Creatinine Ratio: 8 — ABNORMAL LOW (ref 9–20)
BUN: 8 mg/dL (ref 6–24)
Bilirubin Total: 0.6 mg/dL (ref 0.0–1.2)
CO2: 23 mmol/L (ref 20–29)
Calcium: 9.2 mg/dL (ref 8.7–10.2)
Chloride: 101 mmol/L (ref 96–106)
Creatinine, Ser: 1.04 mg/dL (ref 0.76–1.27)
GFR calc Af Amer: 92 mL/min/{1.73_m2} (ref >59)
GFR calc non Af Amer: 80 mL/min/{1.73_m2} (ref >59)
Globulin, Total: 2.4 g/dL (ref 1.5–4.5)
Glucose: 101 mg/dL — ABNORMAL HIGH (ref 65–99)
Potassium: 4.2 mmol/L (ref 3.5–5.2)
Sodium: 138 mmol/L (ref 134–144)
Total Protein: 6.6 g/dL (ref 6.0–8.5)

## 2018-04-09 LAB — LIPID PANEL
Chol/HDL Ratio: 4 ratio (ref 0.0–5.0)
Cholesterol, Total: 155 mg/dL (ref 100–199)
HDL: 39 mg/dL — ABNORMAL LOW (ref >39)
LDL Calculated: 90 mg/dL (ref 0–99)
Triglycerides: 128 mg/dL (ref 0–149)
VLDL Cholesterol Cal: 26 mg/dL (ref 5–40)

## 2018-04-09 LAB — BAYER DCA HB A1C WAIVED: HB A1C (BAYER DCA - WAIVED): 5.7 % (ref <7.0)

## 2018-04-09 MED ORDER — PRAVASTATIN SODIUM 40 MG PO TABS: 40.0000 mg | ORAL_TABLET | Freq: Every day | ORAL | 0 refills | Status: DC

## 2018-04-10 ENCOUNTER — Ambulatory Visit: Payer: Self-pay | Admitting: Family Medicine

## 2018-04-11 ENCOUNTER — Telehealth: Payer: Self-pay | Admitting: Family Medicine

## 2018-04-11 NOTE — Telephone Encounter (Signed)
Pt aware of labs  

## 2018-04-17 DIAGNOSIS — M9903 Segmental and somatic dysfunction of lumbar region: Secondary | ICD-10-CM | POA: Diagnosis not present

## 2018-05-07 DIAGNOSIS — M9903 Segmental and somatic dysfunction of lumbar region: Secondary | ICD-10-CM | POA: Diagnosis not present

## 2018-06-03 DIAGNOSIS — M9903 Segmental and somatic dysfunction of lumbar region: Secondary | ICD-10-CM | POA: Diagnosis not present

## 2018-07-02 DIAGNOSIS — M9903 Segmental and somatic dysfunction of lumbar region: Secondary | ICD-10-CM | POA: Diagnosis not present

## 2018-07-27 ENCOUNTER — Emergency Department (HOSPITAL_COMMUNITY)
Admission: EM | Admit: 2018-07-27 | Discharge: 2018-07-27 | Disposition: A | Discharge status: Home / Self Care | Payer: BLUE CROSS/BLUE SHIELD | Attending: Emergency Medicine | Admitting: Emergency Medicine

## 2018-07-27 ENCOUNTER — Other Ambulatory Visit: Payer: Self-pay

## 2018-07-27 ENCOUNTER — Encounter (HOSPITAL_COMMUNITY): Payer: Self-pay | Admitting: Emergency Medicine

## 2018-07-27 ENCOUNTER — Emergency Department (HOSPITAL_COMMUNITY): Payer: BLUE CROSS/BLUE SHIELD

## 2018-07-27 DIAGNOSIS — R4020 Unspecified coma: Secondary | ICD-10-CM | POA: Diagnosis not present

## 2018-07-27 DIAGNOSIS — R61 Generalized hyperhidrosis: Secondary | ICD-10-CM | POA: Diagnosis not present

## 2018-07-27 DIAGNOSIS — J3489 Other specified disorders of nose and nasal sinuses: Secondary | ICD-10-CM | POA: Insufficient documentation

## 2018-07-27 DIAGNOSIS — R05 Cough: Secondary | ICD-10-CM | POA: Diagnosis not present

## 2018-07-27 DIAGNOSIS — Z6835 Body mass index (BMI) 35.0-35.9, adult: Secondary | ICD-10-CM | POA: Diagnosis not present

## 2018-07-27 DIAGNOSIS — F17220 Nicotine dependence, chewing tobacco, uncomplicated: Secondary | ICD-10-CM | POA: Insufficient documentation

## 2018-07-27 DIAGNOSIS — R55 Syncope and collapse: Secondary | ICD-10-CM | POA: Insufficient documentation

## 2018-07-27 DIAGNOSIS — R42 Dizziness and giddiness: Secondary | ICD-10-CM | POA: Diagnosis not present

## 2018-07-27 DIAGNOSIS — R51 Headache: Secondary | ICD-10-CM | POA: Insufficient documentation

## 2018-07-27 DIAGNOSIS — I10 Essential (primary) hypertension: Secondary | ICD-10-CM | POA: Insufficient documentation

## 2018-07-27 DIAGNOSIS — Z79899 Other long term (current) drug therapy: Secondary | ICD-10-CM | POA: Insufficient documentation

## 2018-07-27 DIAGNOSIS — R4182 Altered mental status, unspecified: Secondary | ICD-10-CM | POA: Diagnosis not present

## 2018-07-27 LAB — CBC WITH DIFFERENTIAL/PLATELET
Basophils Absolute: 0 10*3/uL (ref 0.0–0.1)
Basophils Relative: 1 %
Eosinophils Absolute: 0.3 10*3/uL (ref 0.0–0.7)
Eosinophils Relative: 5 %
HCT: 48.9 % (ref 39.0–52.0)
Hemoglobin: 17 g/dL (ref 13.0–17.0)
Lymphocytes Relative: 20 %
Lymphs Abs: 1.3 10*3/uL (ref 0.7–4.0)
MCH: 32.7 pg (ref 26.0–34.0)
MCHC: 34.8 g/dL (ref 30.0–36.0)
MCV: 94 fL (ref 78.0–100.0)
Monocytes Absolute: 0.5 10*3/uL (ref 0.1–1.0)
Monocytes Relative: 8 %
Neutro Abs: 4.5 10*3/uL (ref 1.7–7.7)
Neutrophils Relative %: 66 %
Platelets: 186 10*3/uL (ref 150–400)
RBC: 5.2 MIL/uL (ref 4.22–5.81)
RDW: 12.6 % (ref 11.5–15.5)
WBC: 6.7 10*3/uL (ref 4.0–10.5)

## 2018-07-27 LAB — URINALYSIS, ROUTINE W REFLEX MICROSCOPIC
Bacteria, UA: NONE SEEN
Bilirubin Urine: NEGATIVE
Glucose, UA: NEGATIVE mg/dL
Ketones, ur: NEGATIVE mg/dL
Leukocytes, UA: NEGATIVE
Nitrite: NEGATIVE
Protein, ur: NEGATIVE mg/dL
Specific Gravity, Urine: 1.005 (ref 1.005–1.030)
pH: 6 (ref 5.0–8.0)

## 2018-07-27 LAB — COMPREHENSIVE METABOLIC PANEL
ALT: 30 U/L (ref 0–44)
AST: 25 U/L (ref 15–41)
Albumin: 4.1 g/dL (ref 3.5–5.0)
Alkaline Phosphatase: 82 U/L (ref 38–126)
Anion gap: 6 (ref 5–15)
BUN: 8 mg/dL (ref 6–20)
CO2: 27 mmol/L (ref 22–32)
Calcium: 9 mg/dL (ref 8.9–10.3)
Chloride: 103 mmol/L (ref 98–111)
Creatinine, Ser: 0.9 mg/dL (ref 0.61–1.24)
GFR calc Af Amer: >60 mL/min (ref >60)
GFR calc non Af Amer: >60 mL/min (ref >60)
Glucose, Bld: 113 mg/dL — ABNORMAL HIGH (ref 70–99)
Potassium: 3.6 mmol/L (ref 3.5–5.1)
Sodium: 136 mmol/L (ref 135–145)
Total Bilirubin: 0.8 mg/dL (ref 0.3–1.2)
Total Protein: 7.5 g/dL (ref 6.5–8.1)

## 2018-07-27 LAB — CBG MONITORING, ED: Glucose-Capillary: 115 mg/dL — ABNORMAL HIGH (ref 70–99)

## 2018-07-27 LAB — TROPONIN I: Troponin I: <0.03 ng/mL (ref <0.03)

## 2018-07-27 MED ORDER — SODIUM CHLORIDE 0.9 % IV SOLN
INTRAVENOUS | Status: DC
  Administered 2018-07-27: 21:00:00 via INTRAVENOUS

## 2018-07-27 MED ORDER — DIPHENHYDRAMINE HCL 25 MG PO CAPS
25.0000 mg | ORAL_CAPSULE | Freq: Once | ORAL | Status: AC
  Administered 2018-07-27: 25 mg via ORAL
  Filled 2018-07-27: qty 1

## 2018-07-27 MED ORDER — SODIUM CHLORIDE 0.9 % IV BOLUS
1000.0000 mL | Freq: Once | INTRAVENOUS | Status: AC
  Administered 2018-07-27: 1000 mL via INTRAVENOUS

## 2018-07-27 NOTE — ED Triage Notes (Addendum)
Patient c/o intermittent altered mental status with tingling in right arm that started this morning at 3am. Patient states "gets spacy in the head." Patient does reports headache with sinus pressure and cough. Denies any slurred speech, facial drooping, weakness, dizziness, or fever. Per wife patient was "clammy" earlier today. Patient seen at Urgent Care and sent here. Per wife had normal EKG.

## 2018-07-27 NOTE — ED Provider Notes (Signed)
[facility] EMERGENCY DEPARTMENT Provider Note   CSN: [id number] Arrival date & time: 07/27/18  1738     History   Chief Complaint Chief Complaint  Patient presents with  . Altered Mental Status    HPI Carlos Miranda is a 57 y.o. male.  Pt presents to the ED today with altered mental status.  Pt said that this morning around 0300, he felt "spacy in his head."  He said it lasted about 1 minute.  He did develop a chill and became clammy.  He said it happened a few more times when he went to work and had to leave early, but then it went away.   Pt has been fine since then.  He has had sinus congestion and headache with cough for the past few days.  He denies any slurred speech, weakness/numbness, dizziness.  Pt went to urgent care who sent him here.     Past Medical History:  Diagnosis Date  . Deafness in left ear   . History of meningitis 6 months old  . Hyperlipidemia   . Hypertension     Patient Active Problem List   Diagnosis Date Noted  . HTN (hypertension) 06/03/2015  . HLD (hyperlipidemia) 06/03/2015  . Arthropod bite 06/03/2015    Past Surgical History:  Procedure Laterality Date  . WISDOM TOOTH EXTRACTION          Home Medications    Prior to Admission medications   Medication Sig Start Date End Date Taking? Authorizing Provider  lisinopril (PRINIVIL,ZESTRIL) 10 MG tablet Take 1 tablet (10 mg total) by mouth daily. 04/01/18  Yes Timmothy Euler, MD  pravastatin (PRAVACHOL) 40 MG tablet Take 1 tablet (40 mg total) by mouth daily. 04/09/18  Yes Timmothy Euler, MD    Family History Family History  Problem Relation Age of Onset  . Arthritis Father   . Lupus Mother     Social History Social History   Tobacco Use  . Smoking status: Never Smoker  . Smokeless tobacco: Current User    Types: Snuff  Substance Use Topics  . Alcohol use: No  . Drug use: No     Allergies   Lasix [furosemide]; Penicillins; Streptomycin; and Sulfa  antibiotics   Review of Systems Review of Systems  Cardiovascular:       Near-syncope  All other systems reviewed and are negative.    Physical Exam Updated Vital Signs BP 138/88   Pulse 75   Temp 98.1 F (36.7 C) (Oral)   Resp 16   Ht 5\' 8"  (1.727 m)   Wt 109.8 kg   SpO2 98%   BMI 36.80 kg/m   Physical Exam  Constitutional: He is oriented to person, place, and time. He appears well-developed and well-nourished.  HENT:  Head: Normocephalic and atraumatic.  Right Ear: External ear normal.  Left Ear: External ear normal.  Nose: Nose normal.  Mouth/Throat: Oropharynx is clear and moist.  Eyes: Pupils are equal, round, and reactive to light. Conjunctivae and EOM are normal.  Neck: Normal range of motion. Neck supple.  Cardiovascular: Normal rate, regular rhythm, normal heart sounds and intact distal pulses.  Pulmonary/Chest: Effort normal and breath sounds normal.  Abdominal: Soft. Bowel sounds are normal.  Musculoskeletal: Normal range of motion.  Neurological: He is alert and oriented to person, place, and time.  Skin: Skin is warm. Capillary refill takes less than 2 seconds.  Psychiatric: He has a normal mood and affect. His behavior is normal. Judgment  and thought content normal.  Nursing note and vitals reviewed.    ED Treatments / Results  Labs (all labs ordered are listed, but only abnormal results are displayed) Labs Reviewed  COMPREHENSIVE METABOLIC PANEL - Abnormal; Notable for the following components:      Result Value   Glucose, Bld 113 (*)    All other components within normal limits  CBG MONITORING, ED - Abnormal; Notable for the following components:   Glucose-Capillary 115 (*)    All other components within normal limits  CBC WITH DIFFERENTIAL/PLATELET  TROPONIN I  URINALYSIS, ROUTINE W REFLEX MICROSCOPIC    EKG EKG Interpretation  Date/Time:  Saturday July 27 2018 17:54:23 EDT Ventricular Rate:  84 PR Interval:  150 QRS  Duration: 84 QT Interval:  378 QTC Calculation: 446 R Axis:   30 Text Interpretation:  Normal sinus rhythm Normal ECG No old tracing to compare Confirmed by Daleen Bo [phone]) on 07/27/2018 6:10:56 PM   Radiology Dg Chest 2 View  Result Date: 07/27/2018 CLINICAL DATA:  Near syncope EXAM: CHEST - 2 VIEW COMPARISON:  None. FINDINGS: Low lung volumes. Top-normal heart size. Normal mediastinal contour. No pneumothorax. No pleural effusion. Lungs appear clear, with no acute consolidative airspace disease and no pulmonary edema. IMPRESSION: No active cardiopulmonary disease. Electronically Signed   By: Ilona Sorrel M.D.   On: 07/27/2018 20:44   Ct Head Wo Contrast  Result Date: 07/27/2018 CLINICAL DATA:  Altered level of consciousness with right upper extremity tingling since 3 a.m. this morning. Headache. Sinus pressure. No reported injury. EXAM: CT HEAD WITHOUT CONTRAST TECHNIQUE: Contiguous axial images were obtained from the base of the skull through the vertex without intravenous contrast. COMPARISON:  None. FINDINGS: Brain: No evidence of parenchymal hemorrhage or extra-axial fluid collection. No mass lesion, mass effect, or midline shift. No CT evidence of acute infarction. Cerebral volume is age appropriate. No ventriculomegaly. Vascular: No acute abnormality. Skull: No evidence of calvarial fracture. Sinuses/Orbits: No fluid levels. Mild mucoperiosteal thickening in the ethmoidal air cells bilaterally. Other:  The mastoid air cells are unopacified. IMPRESSION: 1.  No evidence of acute intracranial abnormality. 2. Mild chronic appearing paranasal sinusitis. Electronically Signed   By: Ilona Sorrel M.D.   On: 07/27/2018 20:50    Procedures Procedures (including critical care time)  Medications Ordered in ED Medications  sodium chloride 0.9 % bolus 1,000 mL (1,000 mLs Intravenous New Bag/Given 07/27/18 2012)    And  0.9 %  sodium chloride infusion ( Intravenous New Bag/Given 07/27/18 2059)   diphenhydrAMINE (BENADRYL) capsule 25 mg (has no administration in time range)     Initial Impression / Assessment and Plan / ED Course  I have reviewed the triage vital signs and the nursing notes.  Pertinent labs & imaging results that were available during my care of the patient were reviewed by me and considered in my medical decision making (see chart for details).    Pt looks good here.  No evidence of CVA.  Pt encouraged to f/u with pcp and return if worse.  Final Clinical Impressions(s) / ED Diagnoses   Final diagnoses:  Near syncope    ED Discharge Orders    None       Isla Pence, MD 07/27/18 2117

## 2019-03-27 ENCOUNTER — Telehealth: Payer: Self-pay | Admitting: *Deleted

## 2019-03-27 NOTE — Telephone Encounter (Signed)
We received fax to refill pravastatin and lisinopril. We are unable to fill due to patient not being seen in over a year. Patient called and message left that he will have to schedule an apponitment.

## 2019-04-01 ENCOUNTER — Other Ambulatory Visit: Payer: Self-pay | Admitting: *Deleted

## 2019-04-01 DIAGNOSIS — E785 Hyperlipidemia, unspecified: Secondary | ICD-10-CM

## 2019-04-01 NOTE — Telephone Encounter (Signed)
appt made for tomorrow

## 2019-04-01 NOTE — Telephone Encounter (Signed)
Former Multimedia programmer. NTBS. 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

## 2019-04-02 ENCOUNTER — Ambulatory Visit (INDEPENDENT_AMBULATORY_CARE_PROVIDER_SITE_OTHER): Payer: BLUE CROSS/BLUE SHIELD | Admitting: Family

## 2019-04-02 ENCOUNTER — Other Ambulatory Visit: Payer: Self-pay

## 2019-04-02 ENCOUNTER — Encounter: Payer: Self-pay | Admitting: Family

## 2019-04-02 VITALS — BP 132/86 | HR 83 | Temp 97.2°F | Ht 68.0 in | Wt 231.4 lb

## 2019-04-02 DIAGNOSIS — Z23 Encounter for immunization: Secondary | ICD-10-CM

## 2019-04-02 DIAGNOSIS — Z1159 Encounter for screening for other viral diseases: Secondary | ICD-10-CM

## 2019-04-02 DIAGNOSIS — Z0000 Encounter for general adult medical examination without abnormal findings: Secondary | ICD-10-CM

## 2019-04-02 DIAGNOSIS — I10 Essential (primary) hypertension: Secondary | ICD-10-CM | POA: Diagnosis not present

## 2019-04-02 DIAGNOSIS — Z0001 Encounter for general adult medical examination with abnormal findings: Secondary | ICD-10-CM | POA: Diagnosis not present

## 2019-04-02 DIAGNOSIS — E785 Hyperlipidemia, unspecified: Secondary | ICD-10-CM | POA: Diagnosis not present

## 2019-04-02 LAB — LIPID PANEL

## 2019-04-02 MED ORDER — PRAVASTATIN SODIUM 40 MG PO TABS: 40.0000 mg | ORAL_TABLET | Freq: Every day | ORAL | 4 refills | Status: DC

## 2019-04-02 MED ORDER — LISINOPRIL 10 MG PO TABS: 10.0000 mg | ORAL_TABLET | Freq: Every day | ORAL | 3 refills | Status: DC

## 2019-04-02 NOTE — Patient Instructions (Signed)

## 2019-04-02 NOTE — Progress Notes (Signed)
Subjective:    Patient ID: Carlos Miranda, male    DOB: 21-Jul-1961, 58 y.o.   MRN: [id number]  Chief Complaint  Patient presents with  . establish with another provider    due for refills and blood work    PT presents to the office today for CPE. States his wife passed January 14, 2019.  Hypertension  This is a chronic problem. The current episode started more than 1 year ago. The problem has been resolved since onset. The problem is controlled. Pertinent negatives include no headaches, malaise/fatigue, peripheral edema or shortness of breath. Risk factors for coronary artery disease include dyslipidemia and obesity. The current treatment provides moderate improvement. There is no history of CAD/MI or heart failure.  Hyperlipidemia  This is a chronic problem. The current episode started more than 1 year ago. The problem is controlled. Recent lipid tests were reviewed and are normal. Exacerbating diseases include obesity. Pertinent negatives include no shortness of breath. Current antihyperlipidemic treatment includes statins. The current treatment provides moderate improvement of lipids. Risk factors for coronary artery disease include dyslipidemia, male sex and hypertension.      Review of Systems  Constitutional: Negative for malaise/fatigue.  Respiratory: Negative for shortness of breath.   Neurological: Negative for headaches.  All other systems reviewed and are negative.  Family History  Problem Relation Age of Onset  . Arthritis Father   . Lupus Mother     Social History   Socioeconomic History  . Marital status: Widowed    Spouse name: Not on file  . Number of children: Not on file  . Years of education: Not on file  . Highest education level: Not on file  Occupational History  . Not on file  Social Needs  . Financial resource strain: Not on file  . Food insecurity:    Worry: Not on file    Inability: Not on file  . Transportation needs:    Medical: Not on file     Non-medical: Not on file  Tobacco Use  . Smoking status: Never Smoker  . Smokeless tobacco: Current User    Types: Snuff  Substance and Sexual Activity  . Alcohol use: No  . Drug use: No  . Sexual activity: Not on file  Lifestyle  . Physical activity:    Days per week: Not on file    Minutes per session: Not on file  . Stress: Not on file  Relationships  . Social connections:    Talks on phone: Not on file    Gets together: Not on file    Attends religious service: Not on file    Active member of club or organization: Not on file    Attends meetings of clubs or organizations: Not on file    Relationship status: Not on file  . Intimate partner violence:    Fear of current or ex partner: Not on file    Emotionally abused: Not on file    Physically abused: Not on file    Forced sexual activity: Not on file  Other Topics Concern  . Not on file  Social History Narrative  . Not on file       Objective:   Physical Exam Vitals signs reviewed.  Constitutional:      General: He is not in acute distress.    Appearance: He is well-developed.  HENT:     Head: Normocephalic.     Right Ear: Tympanic membrane normal.     Left  Ear: Tympanic membrane normal.  Eyes:     General:        Right eye: No discharge.        Left eye: No discharge.     Pupils: Pupils are equal, round, and reactive to light.  Neck:     Musculoskeletal: Normal range of motion and neck supple.     Thyroid: No thyromegaly.  Cardiovascular:     Rate and Rhythm: Normal rate and regular rhythm.     Heart sounds: Normal heart sounds. No murmur.  Pulmonary:     Effort: Pulmonary effort is normal. No respiratory distress.     Breath sounds: Normal breath sounds. No wheezing.  Abdominal:     General: Bowel sounds are normal. There is no distension.     Palpations: Abdomen is soft.     Tenderness: There is no abdominal tenderness.  Musculoskeletal: Normal range of motion.        General: No tenderness.   Skin:    General: Skin is warm and dry.     Findings: No erythema or rash.  Neurological:     Mental Status: He is alert and oriented to person, place, and time.     Cranial Nerves: No cranial nerve deficit.     Deep Tendon Reflexes: Reflexes are normal and symmetric.  Psychiatric:        Behavior: Behavior normal.        Thought Content: Thought content normal.        Judgment: Judgment normal.       BP 132/86   Pulse 83   Temp (!) 97.2 F (36.2 C) (Oral)   Ht _0  (1.727 m)   Wt 231 lb 6.4 oz (105 kg)   BMI 35.18 kg/m      Assessment & Plan:  Carlos Miranda comes in today with chief complaint of establish with another provider (due for refills and blood work)   Diagnosis and orders addressed:  1. Essential hypertension - CMP14+EGFR - CBC with Differential/Platelet - lisinopril (ZESTRIL) 10 MG tablet; Take 1 tablet (10 mg total) by mouth daily.  Dispense: 90 tablet; Refill: 3  2. Hyperlipidemia, unspecified hyperlipidemia type - CMP14+EGFR - CBC with Differential/Platelet - Lipid panel - pravastatin (PRAVACHOL) 40 MG tablet; Take 1 tablet (40 mg total) by mouth daily.  Dispense: 90 tablet; Refill: 4  3. Morbid obesity ([place]) - CMP14+EGFR - CBC with Differential/Platelet  4. Annual physical exam - CMP14+EGFR - CBC with Differential/Platelet - Lipid panel - TSH - PSA, total and free - Hepatitis C antibody  5. Need for hepatitis C screening test - CMP14+EGFR - CBC with Differential/Platelet - Hepatitis C antibody   Labs pending  Health Maintenance reviewed-TDAP given Diet and exercise encouraged  Follow up plan: 6 months    Evelina Dun, FNP

## 2019-04-03 LAB — CMP14+EGFR
ALT: 24 IU/L (ref 0–44)
AST: 21 IU/L (ref 0–40)
Albumin/Globulin Ratio: 2.1 (ref 1.2–2.2)
Albumin: 4.4 g/dL (ref 3.8–4.9)
Alkaline Phosphatase: 87 IU/L (ref 39–117)
BUN/Creatinine Ratio: 9 (ref 9–20)
BUN: 9 mg/dL (ref 6–24)
Bilirubin Total: 0.6 mg/dL (ref 0.0–1.2)
CO2: 24 mmol/L (ref 20–29)
Calcium: 9.4 mg/dL (ref 8.7–10.2)
Chloride: 104 mmol/L (ref 96–106)
Creatinine, Ser: 1 mg/dL (ref 0.76–1.27)
GFR calc Af Amer: 96 mL/min/{1.73_m2} (ref >59)
GFR calc non Af Amer: 83 mL/min/{1.73_m2} (ref >59)
Globulin, Total: 2.1 g/dL (ref 1.5–4.5)
Glucose: 78 mg/dL (ref 65–99)
Potassium: 4 mmol/L (ref 3.5–5.2)
Sodium: 143 mmol/L (ref 134–144)
Total Protein: 6.5 g/dL (ref 6.0–8.5)

## 2019-04-03 LAB — CBC WITH DIFFERENTIAL/PLATELET
Basophils Absolute: 0 10*3/uL (ref 0.0–0.2)
Basos: 1 %
EOS (ABSOLUTE): 0.4 10*3/uL (ref 0.0–0.4)
Eos: 9 %
Hematocrit: 48.9 % (ref 37.5–51.0)
Hemoglobin: 16.5 g/dL (ref 13.0–17.7)
Immature Grans (Abs): 0 10*3/uL (ref 0.0–0.1)
Immature Granulocytes: 0 %
Lymphocytes Absolute: 1.6 10*3/uL (ref 0.7–3.1)
Lymphs: 32 %
MCH: 32.2 pg (ref 26.6–33.0)
MCHC: 33.7 g/dL (ref 31.5–35.7)
MCV: 95 fL (ref 79–97)
Monocytes Absolute: 0.5 10*3/uL (ref 0.1–0.9)
Monocytes: 9 %
Neutrophils Absolute: 2.5 10*3/uL (ref 1.4–7.0)
Neutrophils: 49 %
Platelets: 210 10*3/uL (ref 150–450)
RBC: 5.13 x10E6/uL (ref 4.14–5.80)
RDW: 13.2 % (ref 11.6–15.4)
WBC: 5 10*3/uL (ref 3.4–10.8)

## 2019-04-03 LAB — LIPID PANEL
Chol/HDL Ratio: 3.8 ratio (ref 0.0–5.0)
Cholesterol, Total: 165 mg/dL (ref 100–199)
HDL: 43 mg/dL (ref >39)
LDL Calculated: 95 mg/dL (ref 0–99)
Triglycerides: 134 mg/dL (ref 0–149)
VLDL Cholesterol Cal: 27 mg/dL (ref 5–40)

## 2019-04-03 LAB — HEPATITIS C ANTIBODY: Hep C Virus Ab: <0.1 s/co ratio (ref 0.0–0.9)

## 2019-04-03 LAB — PSA, TOTAL AND FREE
PSA, Free Pct: 22 %
PSA, Free: 0.22 ng/mL
Prostate Specific Ag, Serum: 1 ng/mL (ref 0.0–4.0)

## 2019-04-03 LAB — TSH: TSH: 2.28 u[IU]/mL (ref 0.450–4.500)

## 2019-04-21 DIAGNOSIS — S36420A Contusion of duodenum, initial encounter: Secondary | ICD-10-CM | POA: Diagnosis not present

## 2019-04-21 DIAGNOSIS — S62511A Displaced fracture of proximal phalanx of right thumb, initial encounter for closed fracture: Secondary | ICD-10-CM | POA: Diagnosis not present

## 2019-04-21 DIAGNOSIS — R1084 Generalized abdominal pain: Secondary | ICD-10-CM | POA: Diagnosis not present

## 2019-04-21 DIAGNOSIS — S00512A Abrasion of oral cavity, initial encounter: Secondary | ICD-10-CM | POA: Diagnosis not present

## 2019-04-21 DIAGNOSIS — S36899A Unspecified injury of other intra-abdominal organs, initial encounter: Secondary | ICD-10-CM | POA: Diagnosis not present

## 2019-04-21 DIAGNOSIS — M25512 Pain in left shoulder: Secondary | ICD-10-CM | POA: Diagnosis not present

## 2019-04-21 DIAGNOSIS — J90 Pleural effusion, not elsewhere classified: Secondary | ICD-10-CM | POA: Diagnosis not present

## 2019-04-21 DIAGNOSIS — S60811A Abrasion of right wrist, initial encounter: Secondary | ICD-10-CM | POA: Diagnosis not present

## 2019-04-21 DIAGNOSIS — S52221A Displaced transverse fracture of shaft of right ulna, initial encounter for closed fracture: Secondary | ICD-10-CM | POA: Diagnosis not present

## 2019-04-21 DIAGNOSIS — M79662 Pain in left lower leg: Secondary | ICD-10-CM | POA: Diagnosis not present

## 2019-04-21 DIAGNOSIS — S2243XA Multiple fractures of ribs, bilateral, initial encounter for closed fracture: Secondary | ICD-10-CM | POA: Diagnosis not present

## 2019-04-21 DIAGNOSIS — R40241 Glasgow coma scale score 13-15, unspecified time: Secondary | ICD-10-CM | POA: Diagnosis not present

## 2019-04-21 DIAGNOSIS — M545 Low back pain: Secondary | ICD-10-CM | POA: Diagnosis not present

## 2019-04-21 DIAGNOSIS — K661 Hemoperitoneum: Secondary | ICD-10-CM | POA: Diagnosis not present

## 2019-04-21 DIAGNOSIS — M79632 Pain in left forearm: Secondary | ICD-10-CM | POA: Diagnosis not present

## 2019-04-21 DIAGNOSIS — S60812A Abrasion of left wrist, initial encounter: Secondary | ICD-10-CM | POA: Diagnosis not present

## 2019-04-21 DIAGNOSIS — R140 Abdominal distension (gaseous): Secondary | ICD-10-CM | POA: Diagnosis not present

## 2019-04-21 DIAGNOSIS — S0083XA Contusion of other part of head, initial encounter: Secondary | ICD-10-CM | POA: Diagnosis not present

## 2019-04-21 DIAGNOSIS — M79602 Pain in left arm: Secondary | ICD-10-CM | POA: Diagnosis not present

## 2019-04-21 DIAGNOSIS — I10 Essential (primary) hypertension: Secondary | ICD-10-CM | POA: Diagnosis not present

## 2019-04-21 DIAGNOSIS — S52221D Displaced transverse fracture of shaft of right ulna, subsequent encounter for closed fracture with routine healing: Secondary | ICD-10-CM | POA: Diagnosis not present

## 2019-04-21 DIAGNOSIS — M79642 Pain in left hand: Secondary | ICD-10-CM | POA: Diagnosis not present

## 2019-04-21 DIAGNOSIS — S36221A Contusion of body of pancreas, initial encounter: Secondary | ICD-10-CM | POA: Diagnosis not present

## 2019-04-21 DIAGNOSIS — S62501A Fracture of unspecified phalanx of right thumb, initial encounter for closed fracture: Secondary | ICD-10-CM | POA: Diagnosis not present

## 2019-04-21 DIAGNOSIS — G8911 Acute pain due to trauma: Secondary | ICD-10-CM | POA: Diagnosis not present

## 2019-04-21 DIAGNOSIS — S52201A Unspecified fracture of shaft of right ulna, initial encounter for closed fracture: Secondary | ICD-10-CM | POA: Diagnosis not present

## 2019-04-21 DIAGNOSIS — S80811A Abrasion, right lower leg, initial encounter: Secondary | ICD-10-CM | POA: Diagnosis not present

## 2019-04-21 DIAGNOSIS — S62521A Displaced fracture of distal phalanx of right thumb, initial encounter for closed fracture: Secondary | ICD-10-CM | POA: Diagnosis not present

## 2019-04-21 DIAGNOSIS — R918 Other nonspecific abnormal finding of lung field: Secondary | ICD-10-CM | POA: Diagnosis not present

## 2019-04-21 DIAGNOSIS — R000 Tachycardia, unspecified: Secondary | ICD-10-CM | POA: Diagnosis not present

## 2019-04-21 DIAGNOSIS — R42 Dizziness and giddiness: Secondary | ICD-10-CM | POA: Diagnosis not present

## 2019-04-21 DIAGNOSIS — S2241XA Multiple fractures of ribs, right side, initial encounter for closed fracture: Secondary | ICD-10-CM | POA: Diagnosis not present

## 2019-04-21 DIAGNOSIS — S36898A Other injury of other intra-abdominal organs, initial encounter: Secondary | ICD-10-CM | POA: Diagnosis not present

## 2019-04-21 DIAGNOSIS — S36892A Contusion of other intra-abdominal organs, initial encounter: Secondary | ICD-10-CM | POA: Diagnosis not present

## 2019-04-21 DIAGNOSIS — M79661 Pain in right lower leg: Secondary | ICD-10-CM | POA: Diagnosis not present

## 2019-04-21 DIAGNOSIS — Z1159 Encounter for screening for other viral diseases: Secondary | ICD-10-CM | POA: Diagnosis not present

## 2019-04-21 DIAGNOSIS — M25532 Pain in left wrist: Secondary | ICD-10-CM | POA: Diagnosis not present

## 2019-04-21 DIAGNOSIS — E785 Hyperlipidemia, unspecified: Secondary | ICD-10-CM | POA: Diagnosis not present

## 2019-04-21 DIAGNOSIS — Z041 Encounter for examination and observation following transport accident: Secondary | ICD-10-CM | POA: Diagnosis not present

## 2019-04-21 DIAGNOSIS — R55 Syncope and collapse: Secondary | ICD-10-CM | POA: Diagnosis not present

## 2019-04-21 DIAGNOSIS — M542 Cervicalgia: Secondary | ICD-10-CM | POA: Diagnosis not present

## 2019-04-21 DIAGNOSIS — S301XXA Contusion of abdominal wall, initial encounter: Secondary | ICD-10-CM | POA: Diagnosis not present

## 2019-04-21 DIAGNOSIS — M25522 Pain in left elbow: Secondary | ICD-10-CM | POA: Diagnosis not present

## 2019-04-21 DIAGNOSIS — S80812A Abrasion, left lower leg, initial encounter: Secondary | ICD-10-CM | POA: Diagnosis not present

## 2019-04-21 DIAGNOSIS — H04129 Dry eye syndrome of unspecified lacrimal gland: Secondary | ICD-10-CM | POA: Diagnosis not present

## 2019-04-21 DIAGNOSIS — R531 Weakness: Secondary | ICD-10-CM | POA: Diagnosis not present

## 2019-04-21 DIAGNOSIS — M25521 Pain in right elbow: Secondary | ICD-10-CM | POA: Diagnosis not present

## 2019-04-21 DIAGNOSIS — E872 Acidosis: Secondary | ICD-10-CM | POA: Diagnosis not present

## 2019-04-21 DIAGNOSIS — D62 Acute posthemorrhagic anemia: Secondary | ICD-10-CM | POA: Diagnosis not present

## 2019-05-14 DIAGNOSIS — S52201D Unspecified fracture of shaft of right ulna, subsequent encounter for closed fracture with routine healing: Secondary | ICD-10-CM | POA: Diagnosis not present

## 2019-05-14 DIAGNOSIS — Z4789 Encounter for other orthopedic aftercare: Secondary | ICD-10-CM | POA: Diagnosis not present

## 2019-05-14 DIAGNOSIS — M1811 Unilateral primary osteoarthritis of first carpometacarpal joint, right hand: Secondary | ICD-10-CM | POA: Diagnosis not present

## 2019-05-14 DIAGNOSIS — M19041 Primary osteoarthritis, right hand: Secondary | ICD-10-CM | POA: Diagnosis not present

## 2019-05-14 DIAGNOSIS — Z9889 Other specified postprocedural states: Secondary | ICD-10-CM | POA: Diagnosis not present

## 2019-05-20 DIAGNOSIS — S2241XD Multiple fractures of ribs, right side, subsequent encounter for fracture with routine healing: Secondary | ICD-10-CM | POA: Diagnosis not present

## 2019-06-05 DIAGNOSIS — S52201D Unspecified fracture of shaft of right ulna, subsequent encounter for closed fracture with routine healing: Secondary | ICD-10-CM | POA: Diagnosis not present

## 2019-06-05 DIAGNOSIS — X58XXXD Exposure to other specified factors, subsequent encounter: Secondary | ICD-10-CM | POA: Diagnosis not present

## 2019-06-05 DIAGNOSIS — Z4789 Encounter for other orthopedic aftercare: Secondary | ICD-10-CM | POA: Diagnosis not present

## 2019-06-13 ENCOUNTER — Other Ambulatory Visit: Payer: Self-pay

## 2019-06-16 ENCOUNTER — Telehealth: Payer: Self-pay | Admitting: *Deleted

## 2019-06-16 ENCOUNTER — Other Ambulatory Visit: Payer: Self-pay

## 2019-06-16 ENCOUNTER — Encounter: Payer: Self-pay | Admitting: Family Medicine

## 2019-06-16 ENCOUNTER — Ambulatory Visit (INDEPENDENT_AMBULATORY_CARE_PROVIDER_SITE_OTHER): Payer: BLUE CROSS/BLUE SHIELD | Admitting: Family Medicine

## 2019-06-16 VITALS — BP 138/96 | HR 83 | Temp 98.7°F | Ht 68.0 in | Wt 217.0 lb

## 2019-06-16 DIAGNOSIS — R55 Syncope and collapse: Secondary | ICD-10-CM | POA: Diagnosis not present
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
## 2019-06-16 NOTE — Telephone Encounter (Signed)
Pre-cert for echo #[id number] Valid 06/16/2019 thru 07/15/2019 at [facility]

## 2019-06-17 ENCOUNTER — Ambulatory Visit (HOSPITAL_COMMUNITY)
Admission: RE | Admit: 2019-06-17 | Discharge: 2019-06-17 | Disposition: A | Discharge status: Home / Self Care | Payer: BLUE CROSS/BLUE SHIELD | Source: Ambulatory Visit | Attending: Family Medicine | Admitting: Family Medicine

## 2019-06-17 DIAGNOSIS — R55 Syncope and collapse: Secondary | ICD-10-CM

## 2019-06-17 LAB — CBC WITH DIFFERENTIAL/PLATELET
Basophils Absolute: 0 10*3/uL (ref 0.0–0.2)
Basos: 1 %
EOS (ABSOLUTE): 0.3 10*3/uL (ref 0.0–0.4)
Eos: 6 %
Hematocrit: 45.4 % (ref 37.5–51.0)
Hemoglobin: 14.8 g/dL (ref 13.0–17.7)
Immature Grans (Abs): 0 10*3/uL (ref 0.0–0.1)
Immature Granulocytes: 0 %
Lymphocytes Absolute: 1.5 10*3/uL (ref 0.7–3.1)
Lymphs: 29 %
MCH: 29.7 pg (ref 26.6–33.0)
MCHC: 32.6 g/dL (ref 31.5–35.7)
MCV: 91 fL (ref 79–97)
Monocytes Absolute: 0.3 10*3/uL (ref 0.1–0.9)
Monocytes: 6 %
Neutrophils Absolute: 3.1 10*3/uL (ref 1.4–7.0)
Neutrophils: 58 %
Platelets: 312 10*3/uL (ref 150–450)
RBC: 4.99 x10E6/uL (ref 4.14–5.80)
RDW: 12.9 % (ref 11.6–15.4)
WBC: 5.3 10*3/uL (ref 3.4–10.8)

## 2019-06-17 LAB — CMP14+EGFR
ALT: 18 IU/L (ref 0–44)
AST: 17 IU/L (ref 0–40)
Albumin/Globulin Ratio: 1.8 (ref 1.2–2.2)
Albumin: 4.4 g/dL (ref 3.8–4.9)
Alkaline Phosphatase: 96 IU/L (ref 39–117)
BUN/Creatinine Ratio: 13 (ref 9–20)
BUN: 10 mg/dL (ref 6–24)
Bilirubin Total: 0.3 mg/dL (ref 0.0–1.2)
CO2: 23 mmol/L (ref 20–29)
Calcium: 9.4 mg/dL (ref 8.7–10.2)
Chloride: 101 mmol/L (ref 96–106)
Creatinine, Ser: 0.77 mg/dL (ref 0.76–1.27)
GFR calc Af Amer: 116 mL/min/{1.73_m2} (ref >59)
GFR calc non Af Amer: 100 mL/min/{1.73_m2} (ref >59)
Globulin, Total: 2.4 g/dL (ref 1.5–4.5)
Glucose: 90 mg/dL (ref 65–99)
Potassium: 4.4 mmol/L (ref 3.5–5.2)
Sodium: 138 mmol/L (ref 134–144)
Total Protein: 6.8 g/dL (ref 6.0–8.5)

## 2019-06-17 LAB — TSH: TSH: 2.31 u[IU]/mL (ref 0.450–4.500)

## 2019-06-17 NOTE — Progress Notes (Signed)
*  PRELIMINARY RESULTS* Echocardiogram 2D Echocardiogram has been performed.  Carlos Miranda 06/17/2019, 11:06 AM

## 2019-06-19 ENCOUNTER — Telehealth: Payer: Self-pay | Admitting: Family

## 2019-06-19 ENCOUNTER — Other Ambulatory Visit: Payer: Self-pay | Admitting: *Deleted

## 2019-06-19 DIAGNOSIS — R55 Syncope and collapse: Secondary | ICD-10-CM

## 2019-06-19 NOTE — Progress Notes (Signed)
dq

## 2019-06-19 NOTE — Telephone Encounter (Signed)
Patient aware.

## 2019-06-19 NOTE — Telephone Encounter (Signed)
Patient was unaware that he was to follow up and did not schedule an appointment the day he left from the office visit.  Are you going to do a referral to cardiology and/or neurology for his syncope?

## 2019-06-19 NOTE — Telephone Encounter (Signed)
Go ahead and do a referral for cardiology and neurology, diagnosis syncope

## 2019-07-17 DIAGNOSIS — S52281D Bent bone of right ulna, subsequent encounter for closed fracture with routine healing: Secondary | ICD-10-CM | POA: Diagnosis not present

## 2019-07-17 DIAGNOSIS — Z888 Allergy status to other drugs, medicaments and biological substances status: Secondary | ICD-10-CM | POA: Diagnosis not present

## 2019-07-17 DIAGNOSIS — X58XXXD Exposure to other specified factors, subsequent encounter: Secondary | ICD-10-CM | POA: Diagnosis not present

## 2019-07-17 DIAGNOSIS — Z880 Allergy status to penicillin: Secondary | ICD-10-CM | POA: Diagnosis not present

## 2019-07-17 DIAGNOSIS — Z881 Allergy status to other antibiotic agents status: Secondary | ICD-10-CM | POA: Diagnosis not present

## 2019-07-17 DIAGNOSIS — Z882 Allergy status to sulfonamides status: Secondary | ICD-10-CM | POA: Diagnosis not present

## 2019-07-24 ENCOUNTER — Other Ambulatory Visit: Payer: Self-pay

## 2019-07-24 ENCOUNTER — Ambulatory Visit (INDEPENDENT_AMBULATORY_CARE_PROVIDER_SITE_OTHER): Payer: BLUE CROSS/BLUE SHIELD | Admitting: Neurology

## 2019-07-24 ENCOUNTER — Encounter: Payer: Self-pay | Admitting: Neurology

## 2019-07-24 VITALS — BP 141/93 | HR 89 | Temp 97.7°F | Ht 68.0 in | Wt 232.4 lb

## 2019-07-24 DIAGNOSIS — R4020 Unspecified coma: Secondary | ICD-10-CM | POA: Diagnosis not present

## 2019-07-24 DIAGNOSIS — R55 Syncope and collapse: Secondary | ICD-10-CM

## 2019-07-24 DIAGNOSIS — R569 Unspecified convulsions: Secondary | ICD-10-CM | POA: Diagnosis not present

## 2019-07-24 DIAGNOSIS — G459 Transient cerebral ischemic attack, unspecified: Secondary | ICD-10-CM
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
## 2019-07-24 NOTE — Patient Instructions (Addendum)
I had a long discussion with the patient and his father regarding his multiple episodes of unexplained sudden loss of consciousness which likely represents syncope.  Seizure or TIA is possible though less likely.  I recommend further evaluation by checking EEG, MRI scan of the brain, MRA of the brain and neck, 30-day heart monitor.  He was advised not to drive for 6 months as per [facility] and to keep his scheduled appointment next week with cardiologist.  He will return for follow-up in 2 months or call earlier if necessary   Syncope Syncope is when you pass out (faint) for a short time. It is caused by a sudden decrease in blood flow to the brain. Signs that you may be about to pass out include:  Feeling dizzy or light-headed.  Feeling sick to your stomach (nauseous).  Seeing all white or all black.  Having cold, clammy skin. If you pass out, get help right away. Call your local emergency services (911 in the [place] Do not drive yourself to the hospital. Follow these instructions at home: Watch for any changes in your symptoms. Take these actions to stay safe and help with your symptoms: Lifestyle  Do not drive, use machinery, or play sports until your doctor says it is okay.  Do not drink alcohol.  Do not use any products that contain nicotine or tobacco, such as cigarettes and e-cigarettes. If you need help quitting, ask your doctor.  Drink enough fluid to keep your pee (urine) pale yellow. General instructions  Take over-the-counter and prescription medicines only as told by your doctor.  If you are taking blood pressure or heart medicine, sit up and stand up slowly. Spend a few minutes getting ready to sit and then stand. This can help you feel less dizzy.  Have someone stay with you until you feel stable.  If you start to feel like you might pass out, lie down right away and raise (elevate) your feet above the level of your heart. Breathe deeply and steadily. Wait  until all of the symptoms are gone.  Keep all follow-up visits as told by your doctor. This is important. Get help right away if:  You have a very bad headache.  You pass out once or more than once.  You have pain in your chest, belly, or back.  You have a very fast or uneven heartbeat (palpitations).  It hurts to breathe.  You are bleeding from your mouth or your bottom (rectum).  You have black or tarry poop (stool).  You have jerky movements that you cannot control (seizure).  You are confused.  You have trouble walking.  You are very weak.  You have vision problems. These symptoms may be an emergency. Do not wait to see if the symptoms will go away. Get medical help right away. Call your local emergency services (911 in the [place] Do not drive yourself to the hospital. Summary  Syncope is when you pass out (faint) for a short time. It is caused by a sudden decrease in blood flow to the brain.  Signs that you may be about to faint include feeling dizzy, light-headed, or sick to your stomach, seeing all white or all black, or having cold, clammy skin.  If you start to feel like you might pass out, lie down right away and raise (elevate) your feet above the level of your heart. Breathe deeply and steadily. Wait until all of the symptoms are gone. This information is not intended  to replace advice given to you by your health care provider. Make sure you discuss any questions you have with your health care provider. Document Released: 04/03/2008 Document Revised: 11/28/2017 Document Reviewed: 11/28/2017 Elsevier Patient Education  2020 [employer].

## 2019-07-29 ENCOUNTER — Telehealth: Payer: Self-pay | Admitting: Neurology

## 2019-07-29 NOTE — Telephone Encounter (Signed)
With the diagnosis code and the notes I am not able to get the MRA Neck w/wo contrast approved. I can get the MRI brain w/wo contrast and the MRA head approved. Do you want the patient to proceed on having the MRI Brain & MRA Head ?

## 2019-07-30 ENCOUNTER — Telehealth: Payer: Self-pay | Admitting: *Deleted

## 2019-07-30 NOTE — Telephone Encounter (Signed)
yes

## 2019-07-30 NOTE — Telephone Encounter (Signed)
Please call [facility] at [phone] to set up Cardiac event monitor ordered by Dr. Antony Contras.

## 2019-07-31 ENCOUNTER — Ambulatory Visit: Payer: BLUE CROSS/BLUE SHIELD | Admitting: Cardiovascular Disease

## 2019-07-31 ENCOUNTER — Encounter: Payer: Self-pay | Admitting: Cardiovascular Disease

## 2019-07-31 ENCOUNTER — Ambulatory Visit (INDEPENDENT_AMBULATORY_CARE_PROVIDER_SITE_OTHER): Payer: BLUE CROSS/BLUE SHIELD | Admitting: Cardiovascular Disease

## 2019-07-31 ENCOUNTER — Other Ambulatory Visit: Payer: Self-pay

## 2019-07-31 VITALS — BP 132/70 | HR 86 | Ht 68.0 in | Wt 223.0 lb

## 2019-07-31 DIAGNOSIS — R55 Syncope and collapse: Secondary | ICD-10-CM

## 2019-07-31 NOTE — Telephone Encounter (Signed)
Noted, the MRI Brain and MRA Head are approved.  no to the covid questions MR Brain w/wo contrast & MRA Head wo contrast Dr. Valentina Gu Auth: [id number] (exp. 07/31/19 to 08/29/19). Patient is scheduled at [facility] for 08/06/19.

## 2019-07-31 NOTE — Patient Instructions (Signed)
Medication Instructions:  Continue all current medications.  Labwork: none  Testing/Procedures: none  Follow-Up: To be determined.    Any Other Special Instructions Will Be Listed Below (If Applicable).  If you need a refill on your cardiac medications before your next appointment, please call your pharmacy.
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
## 2019-08-06 ENCOUNTER — Ambulatory Visit: Payer: BLUE CROSS/BLUE SHIELD

## 2019-08-06 ENCOUNTER — Other Ambulatory Visit: Payer: Self-pay

## 2019-08-06 DIAGNOSIS — R55 Syncope and collapse: Secondary | ICD-10-CM | POA: Diagnosis not present

## 2019-08-06 DIAGNOSIS — G459 Transient cerebral ischemic attack, unspecified: Secondary | ICD-10-CM

## 2019-08-06 MED ORDER — GADOBENATE DIMEGLUMINE 529 MG/ML IV SOLN
20.0000 mL | Freq: Once | INTRAVENOUS | Status: AC | PRN
  Administered 2019-08-06: 20 mL via INTRAVENOUS

## 2019-08-07 ENCOUNTER — Ambulatory Visit (INDEPENDENT_AMBULATORY_CARE_PROVIDER_SITE_OTHER): Payer: BLUE CROSS/BLUE SHIELD

## 2019-08-07 DIAGNOSIS — R55 Syncope and collapse: Secondary | ICD-10-CM

## 2019-08-07 DIAGNOSIS — R4020 Unspecified coma: Secondary | ICD-10-CM

## 2019-08-13 ENCOUNTER — Ambulatory Visit (INDEPENDENT_AMBULATORY_CARE_PROVIDER_SITE_OTHER): Payer: BLUE CROSS/BLUE SHIELD

## 2019-08-13 ENCOUNTER — Other Ambulatory Visit: Payer: Self-pay

## 2019-08-13 DIAGNOSIS — R4020 Unspecified coma: Secondary | ICD-10-CM

## 2019-08-13 DIAGNOSIS — R55 Syncope and collapse: Secondary | ICD-10-CM

## 2019-08-18 ENCOUNTER — Telehealth: Payer: Self-pay | Admitting: Family

## 2019-08-18 NOTE — Telephone Encounter (Signed)
Aware.  Needs to follow up with neurologist sooner than the appointment in December. Father will call for quicker appointment.

## 2019-08-18 NOTE — Telephone Encounter (Signed)
Please get him an appointment ASAP

## 2019-08-18 NOTE — Telephone Encounter (Signed)
Please advise 

## 2019-08-19 ENCOUNTER — Telehealth: Payer: Self-pay

## 2019-08-19 NOTE — Telephone Encounter (Signed)
I called pt, left a detailed message on his home number, per DPR, advised him of normal EEG and asking him to call us back with questions or concerns.

## 2019-08-19 NOTE — Telephone Encounter (Signed)
-----   Message from Garvin Fila, MD sent at 08/19/2019  9:49 AM EDT ----- Mitchell Heir inform the patient that EEG study was normal

## 2019-08-21 ENCOUNTER — Telehealth: Payer: Self-pay

## 2019-08-21 ENCOUNTER — Other Ambulatory Visit (HOSPITAL_COMMUNITY): Payer: Self-pay | Admitting: Neurology

## 2019-08-21 MED ORDER — LEVETIRACETAM ER 500 MG PO TB24: 1000.0000 mg | ORAL_TABLET | Freq: Every day | ORAL | 3 refills | Status: DC
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
## 2019-09-04 ENCOUNTER — Telehealth: Payer: Self-pay | Admitting: Neurology

## 2019-09-04 NOTE — Telephone Encounter (Signed)
  This question is about the medication that I am taking for my seizures.  After I went from 1 pill a day to 2 pills a day, I  constantly seem sleepy.  My eyes seem drowsy & if I am sitting I feel as if I am about to go to sleep.  It does not bother my work, since I am usually busy, but my co-workers say I look like I am sleepy all the time. I thought you might want to adjust my medication  ????  As far as I know I have not had any more episodes !!  Please reply--Thank You   Chart reviewed, Keppra XR 500 mg was started seems August 21, 2019, he complains of sleepiness when the dosage was increased to 2 tablets daily  Please check with patient what time to see take his medications, make sure he take his Keppra XR 500 mg 2 tablets before his go to bed, not every morning, this might decrease the side effect of sleepiness.  If he still have questions he may contact our office

## 2019-09-04 NOTE — Telephone Encounter (Signed)
If patient calls back please tell him I sent him a mychart message about how to take keppra to avoid being sleepily. Detail message was left for pt.

## 2019-10-01 ENCOUNTER — Encounter: Payer: Self-pay | Admitting: Neurology

## 2019-10-01 ENCOUNTER — Other Ambulatory Visit: Payer: Self-pay

## 2019-10-01 ENCOUNTER — Ambulatory Visit (INDEPENDENT_AMBULATORY_CARE_PROVIDER_SITE_OTHER): Payer: BLUE CROSS/BLUE SHIELD | Admitting: Neurology

## 2019-10-01 VITALS — BP 134/71 | HR 80 | Temp 97.7°F | Ht 68.0 in | Wt 238.0 lb

## 2019-10-01 DIAGNOSIS — G40209 Localization-related (focal) (partial) symptomatic epilepsy and epileptic syndromes with complex partial seizures, not intractable, without status epilepticus: Secondary | ICD-10-CM | POA: Diagnosis not present

## 2019-10-01 MED ORDER — LEVETIRACETAM ER 500 MG PO TB24: 1000.0000 mg | ORAL_TABLET | Freq: Every day | ORAL | 3 refills | Status: DC
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
## 2019-10-01 NOTE — Patient Instructions (Addendum)
I had a long discussion with the patient and his father regarding his brief episodes of staring and unresponsiveness which likely represent complex partial seizures.  I recommend he continue Keppra XR 1000 mg at night as he seems to be tolerating it well without any side effects.  He was advised to avoid seizure provoking triggers like medication noncompliance, sleep deprivation, irregular eating and sleeping habits.  He was advised not to drive for 6 months from his last seizure as per [facility].  He will return for follow-up in the future in 6 months with my nurse practitioner Janett Billow or call earlier if necessary

## 2019-10-03 ENCOUNTER — Ambulatory Visit: Payer: BLUE CROSS/BLUE SHIELD | Admitting: Family

## 2019-10-07 ENCOUNTER — Telehealth (HOSPITAL_COMMUNITY): Payer: Self-pay | Admitting: Neurology

## 2019-10-07 DIAGNOSIS — B370 Candidal stomatitis: Secondary | ICD-10-CM | POA: Diagnosis not present

## 2019-10-07 DIAGNOSIS — G40009 Localization-related (focal) (partial) idiopathic epilepsy and epileptic syndromes with seizures of localized onset, not intractable, without status epilepticus: Secondary | ICD-10-CM | POA: Diagnosis not present

## 2019-10-07 DIAGNOSIS — J029 Acute pharyngitis, unspecified: Secondary | ICD-10-CM | POA: Diagnosis not present

## 2019-10-07 NOTE — Telephone Encounter (Signed)
I received a MyChart message from patient's father stating that he had another seizure yesterday and he has been having swelling in his tongue with some sore throat and is concerned this may be allergic reaction to Keppra.  I called the patient's father and spoke to him.  Patient is currently on Keppra XR 1000 mg daily and was recently seen by me on 10/01/2019 and apparently was tolerating it well at that time.  Patient did not have any rash on his body or elsewhere.  I recommended patient be seen in urgent care/ER to have his mouth looked at to see if he had a tongue bite.  If the physician determined that this was truly an allergic reaction and angioedema then Keppra may need to be stopped and patient would need to be started on alternative anticonvulsant Dilantin.  Patient would need IV loading dose followed by maintenance dose tablets.  He was advised to call 911 if patient had recurrent seizures.  He voiced understanding.  In case the ER physician felt this was not an allergic reaction we may consider increasing the dose of Keppra.

## 2019-10-09 ENCOUNTER — Telehealth: Payer: Self-pay | Admitting: Neurology

## 2019-10-09 NOTE — Telephone Encounter (Signed)
Pt sent mychart message. Dr Leonie Man is not in the office this week. Will route to him to review.

## 2019-10-09 NOTE — Telephone Encounter (Signed)
Pt called stating that he was advised to got to Urgent Care when he had "episodes" at work the other day and he states he is needing a letter for his boss saying that he is released to got back to work. Pt states the letter can be emailed to his boss at:  [email or web address] Please advise.

## 2019-10-13 NOTE — Telephone Encounter (Signed)
Yes he ,may return to work but needs to stay on his keppra and cannot drive for until 6 months since last episode

## 2019-10-13 NOTE — Telephone Encounter (Signed)
I called pt to discuss. No answer, left a message asking him to call me back. 

## 2019-10-14 ENCOUNTER — Telehealth: Payer: Self-pay | Admitting: Neurology

## 2019-10-14 NOTE — Telephone Encounter (Signed)
Pt called and was informed of mychart message. Pt would like to know if he can automatically start driving after the 6 months or does he need to schedule an appt to see the provider before he starts driving again. Please advise.

## 2019-10-14 NOTE — Telephone Encounter (Signed)
Duplicate note already open look at dec 10 note thanks.

## 2019-10-14 NOTE — Telephone Encounter (Signed)
LEft vm for patient that his last seizure was 10/06/2019 and he cannot drive from 6 months from that date. I stated on vm if he is seizure free he can drive on [id number] which is 6 months. PT already has an appt with JEssica NP on 04/02/2019.

## 2019-10-14 NOTE — Telephone Encounter (Signed)
Carlos Miranda at 10/14/2019 10:13 AM  Status: Signed    Pt called and was informed of mychart message. Pt would like to know if he can automatically start driving after the 6 months or does he need to schedule an appt to see the provider before he starts driving again. Please advise.

## 2019-10-14 NOTE — Telephone Encounter (Signed)
Left patient a mychart message that he can return to work per Dr.Sethi but cannot drive for 6 months due to recent seizure episode.

## 2019-10-16 DIAGNOSIS — Z9889 Other specified postprocedural states: Secondary | ICD-10-CM | POA: Diagnosis not present

## 2019-10-16 DIAGNOSIS — S52281D Bent bone of right ulna, subsequent encounter for closed fracture with routine healing: Secondary | ICD-10-CM | POA: Diagnosis not present

## 2019-10-16 DIAGNOSIS — X58XXXD Exposure to other specified factors, subsequent encounter: Secondary | ICD-10-CM | POA: Diagnosis not present

## 2019-11-11 DIAGNOSIS — M791 Myalgia, unspecified site: Secondary | ICD-10-CM | POA: Diagnosis not present

## 2019-11-11 DIAGNOSIS — Z23 Encounter for immunization: Secondary | ICD-10-CM | POA: Diagnosis not present

## 2019-11-13 ENCOUNTER — Other Ambulatory Visit: Payer: Self-pay

## 2019-11-13 NOTE — Telephone Encounter (Signed)
We are able to review notes in care everywhere. Dr Leonie Man will review notes from urgent care about dilantin dosage for pt.

## 2019-11-13 NOTE — Telephone Encounter (Signed)
Revised. 

## 2019-11-13 NOTE — Telephone Encounter (Signed)
Patient needs to provide office notes from urgent for Dr. Leonie Man to review about dilantin refill. We dont see any notes in epic or care everywhere. SEe mychart notes from pt and Dr. Leonie Man.

## 2019-11-14 NOTE — Telephone Encounter (Signed)
Thanks I have reviewed urgent care note from Dr. Sallyanne Havers.  Patient apparently had allergic reaction to Keppra and was switched to Dilantin.  We should renew Dilantin 300 mg daily

## 2019-11-17 ENCOUNTER — Other Ambulatory Visit: Payer: Self-pay | Admitting: *Deleted

## 2019-11-17 NOTE — Addendum Note (Signed)
Addended by: Hope Pigeon on: 11/17/2019 12:33 PM   Modules accepted: Orders

## 2019-11-18 MED ORDER — PHENYTOIN SODIUM EXTENDED 300 MG PO CAPS: 300.0000 mg | ORAL_CAPSULE | Freq: Every day | ORAL | 5 refills | Status: DC

## 2019-11-18 NOTE — Telephone Encounter (Signed)
I agree with dilantin 300 mg daily

## 2019-11-18 NOTE — Addendum Note (Signed)
Addended by: Hope Pigeon on: 11/18/2019 10:39 AM   Modules accepted: Orders

## 2019-12-09 NOTE — Telephone Encounter (Signed)
Ok to change to 100 mg ER three times daily

## 2019-12-10 ENCOUNTER — Other Ambulatory Visit: Payer: Self-pay

## 2019-12-10 MED ORDER — PHENYTOIN SODIUM EXTENDED 100 MG PO CAPS: 100.0000 mg | ORAL_CAPSULE | Freq: Three times a day (TID) | ORAL | 1 refills | Status: DC

## 2020-01-07 ENCOUNTER — Encounter: Payer: Self-pay | Admitting: Adult Health

## 2020-01-07 ENCOUNTER — Ambulatory Visit (INDEPENDENT_AMBULATORY_CARE_PROVIDER_SITE_OTHER): Payer: BLUE CROSS/BLUE SHIELD | Admitting: Adult Health

## 2020-01-07 ENCOUNTER — Other Ambulatory Visit: Payer: Self-pay

## 2020-01-07 VITALS — BP 158/90 | HR 96 | Temp 98.0°F | Ht 68.0 in | Wt 243.0 lb

## 2020-01-07 DIAGNOSIS — G40209 Localization-related (focal) (partial) symptomatic epilepsy and epileptic syndromes with complex partial seizures, not intractable, without status epilepticus: Secondary | ICD-10-CM

## 2020-01-07 MED ORDER — LEVETIRACETAM ER 500 MG PO TB24: 1000.0000 mg | ORAL_TABLET | Freq: Every day | ORAL | 3 refills | Status: DC

## 2020-01-07 NOTE — Patient Instructions (Addendum)
Your Plan:  Restart Keppra XR 1000mg  nightly - please call office with any concerns regarding restarting keppra  Starting Sunday morning, take phenytoin 1 tablet twice daily for 7 days, then 1 time per day for 7 days then stop - you should be completely stopped by 3/28    Follow up in 3 months or call earlier if needed       Thank you for coming to see [place] at [facility]. I hope we have been able to provide you high quality care today.  You may receive a patient satisfaction survey over the next few weeks. We would appreciate your feedback and comments so that we may continue to improve ourselves and the health of our patients.
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
## 2020-01-08 ENCOUNTER — Encounter: Payer: Self-pay | Admitting: Adult Health

## 2020-01-22 ENCOUNTER — Telehealth: Payer: Self-pay | Admitting: Adult Health

## 2020-01-22 NOTE — Telephone Encounter (Signed)
Wanted to clarify patients medications.

## 2020-01-22 NOTE — Telephone Encounter (Signed)
LMVM for Carlos Miranda, [place] for [employer] relating to her question.

## 2020-02-02 NOTE — Progress Notes (Signed)
I agree with the above plan 

## 2020-03-03 ENCOUNTER — Telehealth: Payer: Self-pay | Admitting: Family

## 2020-03-03 NOTE — Telephone Encounter (Signed)
This is fine, I have only seen patient once.

## 2020-03-03 NOTE — Telephone Encounter (Signed)
Pts dad called to schedule pt an appt for physical. Requested he see Dr Warrick Parisian. Explained to dad that Alyse Low is pt's PCP so that's who he needs to be scheduled with. Dad requesting that Dr Dettinger be listed as pt's PCP.

## 2020-03-03 NOTE — Telephone Encounter (Signed)
Please review. Patient wants to switch to you as PCP

## 2020-03-03 NOTE — Telephone Encounter (Signed)
I am okay with the transfer if Carlos Miranda is okay with it.

## 2020-03-03 NOTE — Telephone Encounter (Signed)
An appt has been made with Dr. Warrick Parisian 03/31/2020.

## 2020-03-31 ENCOUNTER — Encounter: Payer: BLUE CROSS/BLUE SHIELD | Admitting: Family

## 2020-03-31 ENCOUNTER — Other Ambulatory Visit: Payer: Self-pay

## 2020-03-31 ENCOUNTER — Encounter: Payer: Self-pay | Admitting: Family Medicine

## 2020-03-31 ENCOUNTER — Ambulatory Visit (INDEPENDENT_AMBULATORY_CARE_PROVIDER_SITE_OTHER): Payer: BLUE CROSS/BLUE SHIELD | Admitting: Family Medicine

## 2020-03-31 VITALS — BP 125/77 | HR 80 | Temp 98.0°F | Ht 68.0 in | Wt 241.0 lb

## 2020-03-31 DIAGNOSIS — G40909 Epilepsy, unspecified, not intractable, without status epilepticus: Secondary | ICD-10-CM

## 2020-03-31 DIAGNOSIS — E785 Hyperlipidemia, unspecified: Secondary | ICD-10-CM

## 2020-03-31 DIAGNOSIS — I10 Essential (primary) hypertension: Secondary | ICD-10-CM | POA: Diagnosis not present

## 2020-03-31 DIAGNOSIS — Z0001 Encounter for general adult medical examination with abnormal findings: Secondary | ICD-10-CM | POA: Diagnosis not present

## 2020-03-31 DIAGNOSIS — Z0000 Encounter for general adult medical examination without abnormal findings: Secondary | ICD-10-CM

## 2020-03-31 DIAGNOSIS — Z1211 Encounter for screening for malignant neoplasm of colon: Secondary | ICD-10-CM

## 2020-03-31 MED ORDER — LISINOPRIL 10 MG PO TABS: 10.0000 mg | ORAL_TABLET | Freq: Every day | ORAL | 3 refills | Status: DC

## 2020-03-31 MED ORDER — PRAVASTATIN SODIUM 40 MG PO TABS: 40.0000 mg | ORAL_TABLET | Freq: Every day | ORAL | 3 refills | Status: DC
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
## 2020-04-01 ENCOUNTER — Ambulatory Visit: Payer: BLUE CROSS/BLUE SHIELD | Admitting: Adult Health

## 2020-04-01 LAB — CBC WITH DIFFERENTIAL/PLATELET
Basophils Absolute: 0.1 10*3/uL (ref 0.0–0.2)
Basos: 1 %
EOS (ABSOLUTE): 0.4 10*3/uL (ref 0.0–0.4)
Eos: 8 %
Hematocrit: 40.4 % (ref 37.5–51.0)
Hemoglobin: 13 g/dL (ref 13.0–17.7)
Immature Grans (Abs): 0 10*3/uL (ref 0.0–0.1)
Immature Granulocytes: 0 %
Lymphocytes Absolute: 1.4 10*3/uL (ref 0.7–3.1)
Lymphs: 27 %
MCH: 28.4 pg (ref 26.6–33.0)
MCHC: 32.2 g/dL (ref 31.5–35.7)
MCV: 88 fL (ref 79–97)
Monocytes Absolute: 0.4 10*3/uL (ref 0.1–0.9)
Monocytes: 8 %
Neutrophils Absolute: 2.9 10*3/uL (ref 1.4–7.0)
Neutrophils: 56 %
Platelets: 288 10*3/uL (ref 150–450)
RBC: 4.58 x10E6/uL (ref 4.14–5.80)
RDW: 13.5 % (ref 11.6–15.4)
WBC: 5.3 10*3/uL (ref 3.4–10.8)

## 2020-04-01 LAB — CMP14+EGFR
ALT: 13 IU/L (ref 0–44)
AST: 17 IU/L (ref 0–40)
Albumin/Globulin Ratio: 1.8 (ref 1.2–2.2)
Albumin: 4.3 g/dL (ref 3.8–4.9)
Alkaline Phosphatase: 98 IU/L (ref 48–121)
BUN/Creatinine Ratio: 12 (ref 9–20)
BUN: 11 mg/dL (ref 6–24)
Bilirubin Total: 0.6 mg/dL (ref 0.0–1.2)
CO2: 24 mmol/L (ref 20–29)
Calcium: 9.2 mg/dL (ref 8.7–10.2)
Chloride: 103 mmol/L (ref 96–106)
Creatinine, Ser: 0.9 mg/dL (ref 0.76–1.27)
GFR calc Af Amer: 108 mL/min/{1.73_m2} (ref >59)
GFR calc non Af Amer: 94 mL/min/{1.73_m2} (ref >59)
Globulin, Total: 2.4 g/dL (ref 1.5–4.5)
Glucose: 89 mg/dL (ref 65–99)
Potassium: 4.6 mmol/L (ref 3.5–5.2)
Sodium: 139 mmol/L (ref 134–144)
Total Protein: 6.7 g/dL (ref 6.0–8.5)

## 2020-04-01 LAB — LIPID PANEL
Chol/HDL Ratio: 3.6 ratio (ref 0.0–5.0)
Cholesterol, Total: 140 mg/dL (ref 100–199)
HDL: 39 mg/dL — ABNORMAL LOW (ref >39)
LDL Chol Calc (NIH): 82 mg/dL (ref 0–99)
Triglycerides: 104 mg/dL (ref 0–149)
VLDL Cholesterol Cal: 19 mg/dL (ref 5–40)

## 2020-04-01 LAB — TSH: TSH: 1.99 u[IU]/mL (ref 0.450–4.500)

## 2020-04-07 ENCOUNTER — Encounter: Payer: Self-pay | Admitting: Neurology

## 2020-04-07 ENCOUNTER — Encounter: Payer: Self-pay | Admitting: Gastroenterology

## 2020-04-19 ENCOUNTER — Other Ambulatory Visit: Payer: Self-pay

## 2020-04-19 ENCOUNTER — Encounter: Payer: Self-pay | Admitting: Adult Health

## 2020-04-19 ENCOUNTER — Ambulatory Visit (INDEPENDENT_AMBULATORY_CARE_PROVIDER_SITE_OTHER): Payer: BLUE CROSS/BLUE SHIELD | Admitting: Adult Health

## 2020-04-19 ENCOUNTER — Telehealth: Payer: Self-pay

## 2020-04-19 VITALS — BP 131/83 | HR 98 | Ht 68.0 in | Wt 242.0 lb

## 2020-04-19 DIAGNOSIS — R569 Unspecified convulsions: Secondary | ICD-10-CM | POA: Diagnosis not present

## 2020-04-19 MED ORDER — LEVETIRACETAM ER 500 MG PO TB24: 1000.0000 mg | ORAL_TABLET | Freq: Every day | ORAL | 3 refills | Status: DC

## 2020-04-19 MED ORDER — LAMOTRIGINE 25 MG PO TABS: ORAL_TABLET | ORAL | 3 refills | Status: DC

## 2020-04-19 NOTE — Telephone Encounter (Signed)
Called and LM for pt to disregard previous message about needing to reschedule his procedure. The situation has been resolved. (The other pt in the 7-7 spot at 1:30pm had to be postponed due to results of stress test).

## 2020-04-19 NOTE — Progress Notes (Signed)
I agree with the above plan 
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
## 2020-04-19 NOTE — Telephone Encounter (Signed)
-----   Message from Larina Bras, [place] sent at 04/16/2020  2:25 PM EDT ----- Regarding: FW: [facility] overbooked in the [place] on 7-7 at 1:30pm  ----- Message ----- From: Marlon Pel, RN Sent: 04/16/2020  10:11 AM EDT To: Roetta Sessions, CMA Subject: RE: Havery Moros overbooked in the [place] on 7-7 #  Jan I left him a message to call back to reschedule.  He will need to move to another day,  No available slots on 7/7 ----- Message ----- From: Roetta Sessions, CMA Sent: 04/15/2020   5:45 PM EDT To: Marlon Pel, RN Subject: [facility] overbooked in the [place] on 7-7 at 1#  Sheri - [facility] noticed he is overbooked in the [place] at 1:30pm on Wednesday 7-7. He has a colon and an EGD both scheduled at 1:30pm. He asked me to see if one could move to 4:00pm.    I called the EGD, Elsie Stain, and it is NOT convenient for him at all! - long story.  If you have time could you call the colon scheduled for 1:30pm - Deedra Ehrich, to see if he would come at 3:00pm for a 4pm procedure. If you don't have time then I can try to call next week. Just let me know! :-)  Thanks! Jan

## 2020-04-19 NOTE — Patient Instructions (Addendum)
Your Plan:  Continue keppra XR 1000mg  nightly - refill provided   Start lamictal in addition to keppra due to continued seizures We will slowing increase dosage of lamictal to limit possible side effects  Week 1 and 2: 25mg  nightly Week 3 and 4: 25mg  twice daily Week 5: 50mg  twice daily  If you have additional seizures once you reach the 50mg  twice daily dosing, please call office for further dosage adjustments as usual maintenance dose is between 225mg /day to 375mg /day but you may do well on lower dosage   Please do not hesitate office if you have any difficulty tolerating or any medication related concerns  We will continue both Lamictal and keppra to ensure adequate management of your seizures prior to decreasing keppra with hopeful discontinuation     Follow up will be determined after evaluation by Dr. Delice Lesch as their office may take over further management       Thank you for coming to see [place] at [facility]. I hope we have been able to provide you high quality care today.  You may receive a patient satisfaction survey over the next few weeks. We would appreciate your feedback and comments so that we may continue to improve ourselves and the health of our patients.    Lamotrigine tablets What is this medicine? LAMOTRIGINE (la MOE Hendricks Limes) is used to control seizures in adults and children with epilepsy and Lennox-Gastaut syndrome. It is also used in adults to treat bipolar disorder. This medicine may be used for other purposes; ask your health care provider or pharmacist if you have questions. COMMON BRAND NAME(S): Lamictal, Subvenite What should I tell my health care provider before I take this medicine? They need to know if you have any of these conditions:  aseptic meningitis during prior use of lamotrigine  depression  folate deficiency  kidney disease  liver disease  suicidal thoughts, plans, or attempt; a previous suicide attempt by you  or a family member  an unusual or allergic reaction to lamotrigine or other seizure medications, other medicines, foods, dyes, or preservatives  pregnant or trying to get pregnant  breast-feeding How should I use this medicine? Take this medicine by mouth with a glass of water. Follow the directions on the prescription label. Do not chew these tablets. If this medicine upsets your stomach, take it with food or milk. Take your doses at regular intervals. Do not take your medicine more often than directed. A special MedGuide will be given to you by the pharmacist with each new prescription and refill. Be sure to read this information carefully each time. Talk to your pediatrician regarding the use of this medicine in children. While this drug may be prescribed for children as young as 2 years for selected conditions, precautions do apply. Overdosage: If you think you have taken too much of this medicine contact a poison control center or emergency room at once. NOTE: This medicine is only for you. Do not share this medicine with others. What if I miss a dose? If you miss a dose, take it as soon as you can. If it is almost time for your next dose, take only that dose. Do not take double or extra doses. What may interact with this medicine?  atazanavir  carbamazepine  male hormones, including contraceptive or birth control pills  lopinavir  methotrexate  phenobarbital  phenytoin  primidone  pyrimethamine  rifampin  ritonavir  trimethoprim  valproic acid This list may not describe all possible  interactions. Give your health care provider a list of all the medicines, herbs, non-prescription drugs, or dietary supplements you use. Also tell them if you smoke, drink alcohol, or use illegal drugs. Some items may interact with your medicine. What should I watch for while using this medicine? Visit your doctor or health care provider for regular checks on your progress. If you take  this medicine for seizures, wear a Medic Alert bracelet or necklace. Carry an identification card with information about your condition, medicines, and doctor or health care provider. It is important to take this medicine exactly as directed. When first starting treatment, your dose will need to be adjusted slowly. It may take weeks or months before your dose is stable. You should contact your doctor or health care provider if your seizures get worse or if you have any new types of seizures. Do not stop taking this medicine unless instructed by your doctor or health care provider. Stopping your medicine suddenly can increase your seizures or their severity. This medicine may cause serious skin reactions. They can happen weeks to months after starting the medicine. Contact your health care provider right away if you notice fevers or flu-like symptoms with a rash. The rash may be red or purple and then turn into blisters or peeling of the skin. Or, you might notice a red rash with swelling of the face, lips or lymph nodes in your neck or under your arms. You may get drowsy, dizzy, or have blurred vision. Do not drive, use machinery, or do anything that needs mental alertness until you know how this medicine affects you. To reduce dizzy or fainting spells, do not sit or stand up quickly, especially if you are an older patient. Alcohol can increase drowsiness and dizziness. Avoid alcoholic drinks. If you are taking this medicine for bipolar disorder, it is important to report any changes in your mood to your doctor or health care provider. If your condition gets worse, you get mentally depressed, feel very hyperactive or manic, have difficulty sleeping, or have thoughts of hurting yourself or committing suicide, you need to get help from your health care provider right away. If you are a caregiver for someone taking this medicine for bipolar disorder, you should also report these behavioral changes right away. The  use of this medicine may increase the chance of suicidal thoughts or actions. Pay special attention to how you are responding while on this medicine. Your mouth may get dry. Chewing sugarless gum or sucking hard candy, and drinking plenty of water may help. Contact your doctor if the problem does not go away or is severe. Women who become pregnant while using this medicine may enroll in the [place] Pregnancy Registry by calling [phone]. This registry collects information about the safety of antiepileptic drug use during pregnancy. This medicine may cause a decrease in folic acid. You should make sure that you get enough folic acid while you are taking this medicine. Discuss the foods you eat and the vitamins you take with your health care provider. What side effects may I notice from receiving this medicine? Side effects that you should report to your doctor or health care professional as soon as possible:  allergic reactions like skin rash, itching or hives, swelling of the face, lips, or tongue  changes in vision  depressed mood  elevated mood, decreased need for sleep, racing thoughts, impulsive behavior  loss of balance or coordination  mouth sores  rash, fever, and swollen  lymph nodes  redness, blistering, peeling or loosening of the skin, including inside the mouth  right upper belly pain  seizures  severe muscle pain  signs and symptoms of aseptic meningitis such as stiff neck and sensitivity to light, headache, drowsiness, fever, nausea, vomiting, rash  signs of infection - fever or chills, cough, sore throat, pain or difficulty passing urine  suicidal thoughts or other mood changes  swollen lymph nodes  trouble walking  unusual bruising or bleeding  unusually weak or tired  yellowing of the eyes or skin Side effects that usually do not require medical attention (report to your doctor or health care professional if they continue or  are bothersome):  diarrhea  dizziness  dry mouth  stuffy nose  tiredness  tremors  trouble sleeping This list may not describe all possible side effects. Call your doctor for medical advice about side effects. You may report side effects to FDA at 1-800-FDA-1088. Where should I keep my medicine? Keep out of reach of children. Store at room temperature between 15 and 30 degrees C (59 and 86 degrees F). Throw away any unused medicine after the expiration date. NOTE: This sheet is a summary. It may not cover all possible information. If you have questions about this medicine, talk to your doctor, pharmacist, or health care provider.  2020 Elsevier/Gold Standard (2019-01-17 15:03:40)

## 2020-04-20 ENCOUNTER — Telehealth: Payer: Self-pay | Admitting: *Deleted
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
## 2020-04-21 ENCOUNTER — Encounter: Payer: Self-pay | Admitting: Physician Assistant

## 2020-04-21 NOTE — Telephone Encounter (Signed)
Pt scheduled for ov with Amy Esterwood on 7/29.

## 2020-05-05 ENCOUNTER — Encounter: Payer: BLUE CROSS/BLUE SHIELD | Admitting: Gastroenterology

## 2020-05-17 ENCOUNTER — Other Ambulatory Visit: Payer: Self-pay | Admitting: *Deleted

## 2020-05-17 DIAGNOSIS — I10 Essential (primary) hypertension: Secondary | ICD-10-CM

## 2020-05-17 MED ORDER — LISINOPRIL 10 MG PO TABS: 10.0000 mg | ORAL_TABLET | Freq: Every day | ORAL | 3 refills | Status: DC

## 2020-05-27 ENCOUNTER — Ambulatory Visit (INDEPENDENT_AMBULATORY_CARE_PROVIDER_SITE_OTHER): Payer: BLUE CROSS/BLUE SHIELD | Admitting: Physician Assistant

## 2020-05-27 ENCOUNTER — Encounter: Payer: Self-pay | Admitting: Physician Assistant

## 2020-05-27 VITALS — BP 132/78 | HR 88 | Ht 68.0 in | Wt 242.2 lb

## 2020-05-27 DIAGNOSIS — Z01818 Encounter for other preprocedural examination: Secondary | ICD-10-CM | POA: Diagnosis not present

## 2020-05-27 DIAGNOSIS — G40909 Epilepsy, unspecified, not intractable, without status epilepticus: Secondary | ICD-10-CM | POA: Diagnosis not present

## 2020-05-27 DIAGNOSIS — Z1211 Encounter for screening for malignant neoplasm of colon: Secondary | ICD-10-CM | POA: Diagnosis not present

## 2020-05-27 MED ORDER — SUTAB 1479-225-188 MG PO TABS
1.0000 | ORAL_TABLET | ORAL | 0 refills | Status: DC
Start: 2020-05-27 — End: 2020-07-29
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
## 2020-05-27 NOTE — Patient Instructions (Signed)
If you are age 59 or older, your body mass index should be between 23-30. Your Body mass index is 36.83 kg/m. If this is out of the aforementioned range listed, please consider follow up with your Primary Care Provider.  If you are age 40 or younger, your body mass index should be between 19-25. Your Body mass index is 36.83 kg/m. If this is out of the aformentioned range listed, please consider follow up with your Primary Care Provider.   You have been scheduled for a colonoscopy. Please follow written instructions given to you at your visit today.  Please pick up your prep supplies at the pharmacy within the next 1-3 days. If you use inhalers (even only as needed), please bring them with you on the day of your procedure.  Follow up pending the results of your colonoscopy.

## 2020-05-30 NOTE — Progress Notes (Signed)
Attending Physician's Attestation   I have reviewed the chart.   I agree with the Advanced Practitioner's note, impression, and recommendations with any updates as below. Agree with need for colon cancer screening.  However, optimization of his seizure medications is most ideal in a 61-month follow-up colonoscopy should be reasonable as long as he is doing well without seizure for few months.   Justice Britain, MD [facility] Advanced [facility]oscopy Office # [phone]

## 2020-06-30 ENCOUNTER — Other Ambulatory Visit: Payer: Self-pay | Admitting: *Deleted

## 2020-06-30 DIAGNOSIS — E785 Hyperlipidemia, unspecified: Secondary | ICD-10-CM

## 2020-07-02 ENCOUNTER — Other Ambulatory Visit: Payer: Self-pay

## 2020-07-02 DIAGNOSIS — E785 Hyperlipidemia, unspecified: Secondary | ICD-10-CM

## 2020-07-02 MED ORDER — PRAVASTATIN SODIUM 40 MG PO TABS: 40.0000 mg | ORAL_TABLET | Freq: Every day | ORAL | 1 refills | Status: DC

## 2020-07-19 ENCOUNTER — Other Ambulatory Visit: Payer: Self-pay

## 2020-07-19 ENCOUNTER — Ambulatory Visit (INDEPENDENT_AMBULATORY_CARE_PROVIDER_SITE_OTHER): Payer: BLUE CROSS/BLUE SHIELD | Admitting: Neurology

## 2020-07-19 ENCOUNTER — Encounter: Payer: Self-pay | Admitting: Neurology

## 2020-07-19 VITALS — BP 120/73 | HR 94 | Ht 68.0 in | Wt 237.0 lb

## 2020-07-19 DIAGNOSIS — R400 Somnolence: Secondary | ICD-10-CM

## 2020-07-19 DIAGNOSIS — G40009 Localization-related (focal) (partial) idiopathic epilepsy and epileptic syndromes with seizures of localized onset, not intractable, without status epilepticus: Secondary | ICD-10-CM | POA: Diagnosis not present

## 2020-07-19 MED ORDER — LAMOTRIGINE ER 200 MG PO TB24: ORAL_TABLET | ORAL | 11 refills | Status: DC

## 2020-07-19 MED ORDER — LEVETIRACETAM ER 500 MG PO TB24
1000.0000 mg | ORAL_TABLET | Freq: Every day | ORAL | 3 refills | Status: DC
Start: 2020-07-19 — End: 2021-01-26
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
## 2020-07-21 ENCOUNTER — Telehealth: Payer: Self-pay | Admitting: *Deleted

## 2020-07-21 ENCOUNTER — Encounter: Payer: Self-pay | Admitting: Gastroenterology

## 2020-07-21 NOTE — Telephone Encounter (Signed)
LMOM to call back so we can schedule before his next appointment with Dr. Delice Lesch. Let him know these appointments fill up fast so call back as soon as he is able with his calendar handy. Also let him know we schedule these on Monday mornings to Wednesday mornings.

## 2020-07-28 ENCOUNTER — Telehealth: Payer: Self-pay | Admitting: *Deleted

## 2020-07-28 NOTE — Telephone Encounter (Signed)
LMOM to call us back to schedule his 48 hr ambulatory EEG.

## 2020-07-29 ENCOUNTER — Telehealth: Payer: Self-pay

## 2020-07-29 ENCOUNTER — Other Ambulatory Visit: Payer: Self-pay

## 2020-07-29 ENCOUNTER — Ambulatory Visit (AMBULATORY_SURGERY_CENTER): Payer: BLUE CROSS/BLUE SHIELD | Admitting: Gastroenterology

## 2020-07-29 ENCOUNTER — Encounter: Payer: Self-pay | Admitting: Gastroenterology

## 2020-07-29 ENCOUNTER — Other Ambulatory Visit (INDEPENDENT_AMBULATORY_CARE_PROVIDER_SITE_OTHER): Payer: BLUE CROSS/BLUE SHIELD

## 2020-07-29 VITALS — BP 109/72 | HR 71 | Temp 99.3°F | Resp 16 | Ht 68.0 in | Wt 237.0 lb

## 2020-07-29 DIAGNOSIS — C182 Malignant neoplasm of ascending colon: Secondary | ICD-10-CM

## 2020-07-29 DIAGNOSIS — D125 Benign neoplasm of sigmoid colon: Secondary | ICD-10-CM

## 2020-07-29 DIAGNOSIS — K6389 Other specified diseases of intestine: Secondary | ICD-10-CM

## 2020-07-29 DIAGNOSIS — Z1211 Encounter for screening for malignant neoplasm of colon: Secondary | ICD-10-CM

## 2020-07-29 DIAGNOSIS — D122 Benign neoplasm of ascending colon: Secondary | ICD-10-CM | POA: Diagnosis not present

## 2020-07-29 DIAGNOSIS — D127 Benign neoplasm of rectosigmoid junction: Secondary | ICD-10-CM

## 2020-07-29 DIAGNOSIS — G40909 Epilepsy, unspecified, not intractable, without status epilepticus: Secondary | ICD-10-CM

## 2020-07-29 LAB — COMPREHENSIVE METABOLIC PANEL
ALT: 13 U/L (ref 0–53)
AST: 15 U/L (ref 0–37)
Albumin: 4.2 g/dL (ref 3.5–5.2)
Alkaline Phosphatase: 85 U/L (ref 39–117)
BUN: 13 mg/dL (ref 6–23)
CO2: 25 mEq/L (ref 19–32)
Calcium: 8.7 mg/dL (ref 8.4–10.5)
Chloride: 106 mEq/L (ref 96–112)
Creatinine, Ser: 1.11 mg/dL (ref 0.40–1.50)
GFR: 67.77 mL/min (ref >60.00)
Glucose, Bld: 94 mg/dL (ref 70–99)
Potassium: 3.9 mEq/L (ref 3.5–5.1)
Sodium: 138 mEq/L (ref 135–145)
Total Bilirubin: 0.5 mg/dL (ref 0.2–1.2)
Total Protein: 7 g/dL (ref 6.0–8.3)

## 2020-07-29 LAB — CBC
HCT: 33.4 % — ABNORMAL LOW (ref 39.0–52.0)
Hemoglobin: 10.2 g/dL — ABNORMAL LOW (ref 13.0–17.0)
MCHC: 30.6 g/dL (ref 30.0–36.0)
MCV: 75.6 fl — ABNORMAL LOW (ref 78.0–100.0)
Platelets: 355 10*3/uL (ref 150.0–400.0)
RBC: 4.42 Mil/uL (ref 4.22–5.81)
RDW: 16.2 % — ABNORMAL HIGH (ref 11.5–15.5)
WBC: 5 10*3/uL (ref 4.0–10.5)

## 2020-07-29 LAB — PROTIME-INR
INR: 1.2 ratio — ABNORMAL HIGH (ref 0.8–1.0)
Prothrombin Time: 13.3 s — ABNORMAL HIGH (ref 9.6–13.1)

## 2020-07-29 MED ORDER — SODIUM CHLORIDE 0.9 % IV SOLN: 500.0000 mL | Freq: Once | INTRAVENOUS | Status: DC

## 2020-07-29 NOTE — Telephone Encounter (Signed)
Received call today from [facility] requesting orders for CEA, CBC, CMET and PT/INR be placed per Dr. Rush Landmark (refer to colonoscopy report for DOS 07/29/20).

## 2020-07-29 NOTE — Progress Notes (Signed)
Called to room to assist during endoscopic procedure.  Patient ID and intended procedure confirmed with present staff. Received instructions for my participation in the procedure from the performing physician.  

## 2020-07-29 NOTE — Progress Notes (Signed)
WS IV. MO vitals.

## 2020-07-29 NOTE — Progress Notes (Signed)
Report given to PACU, vss 

## 2020-07-29 NOTE — Patient Instructions (Signed)
Await pathology results from Dr. Everardo Pacific High fiber diet encouraged    YOU HAD AN ENDOSCOPIC PROCEDURE TODAY AT [place]:   Refer to the procedure report that was given to you for any specific questions about what was found during the examination.  If the procedure report does not answer your questions, please call your gastroenterologist to clarify.  If you requested that your care partner not be given the details of your procedure findings, then the procedure report has been included in a sealed envelope for you to review at your convenience later.  YOU SHOULD EXPECT: Some feelings of bloating in the abdomen. Passage of more gas than usual.  Walking can help get rid of the air that was put into your GI tract during the procedure and reduce the bloating. If you had a lower endoscopy (such as a colonoscopy or flexible sigmoidoscopy) you may notice spotting of blood in your stool or on the toilet paper. If you underwent a bowel prep for your procedure, you may not have a normal bowel movement for a few days.  Please Note:  You might notice some irritation and congestion in your nose or some drainage.  This is from the oxygen used during your procedure.  There is no need for concern and it should clear up in a day or so.  SYMPTOMS TO REPORT IMMEDIATELY:   Following lower endoscopy (colonoscopy or flexible sigmoidoscopy):  Excessive amounts of blood in the stool  Significant tenderness or worsening of abdominal pains  Swelling of the abdomen that is new, acute  Fever of 100F or higher  For urgent or emergent issues, a gastroenterologist can be reached at any hour by calling [phone]. Do not use MyChart messaging for urgent concerns.    DIET:  We do recommend a small meal at first, but then you may proceed to your regular diet.  Drink plenty of fluids but you should avoid alcoholic beverages for 24 hours.  ACTIVITY:  You should plan to take it easy for the rest of  today and you should NOT DRIVE or use heavy machinery until tomorrow (because of the sedation medicines used during the test).    FOLLOW UP: Our staff will call the number listed on your records 48-72 hours following your procedure to check on you and address any questions or concerns that you may have regarding the information given to you following your procedure. If we do not reach you, we will leave a message.  We will attempt to reach you two times.  During this call, we will ask if you have developed any symptoms of COVID 19. If you develop any symptoms (ie: fever, flu-like symptoms, shortness of breath, cough etc.) before then, please call [phone].  If you test positive for Covid 19 in the 2 weeks post procedure, please call and report this information to [place].    If any biopsies were taken you will be contacted by phone or by letter within the next 1-3 weeks.  Please call us at [phone] if you have not heard about the biopsies in 3 weeks.    SIGNATURES/CONFIDENTIALITY: You and/or your care partner have signed paperwork which will be entered into your electronic medical record.  These signatures attest to the fact that that the information above on your After Visit Summary has been reviewed and is understood.  Full responsibility of the confidentiality of this discharge information lies with you and/or your care-partner.

## 2020-07-29 NOTE — Op Note (Signed)
[facility] Patient Name: Carlos Miranda Procedure Date: 07/29/2020 12:13 PM MRN: [id number] Endoscopist: Justice Britain , MD Age: 59 Referring MD:  Date of Birth: 1961-06-25 Gender: Male Account #: [id number] Procedure:                Colonoscopy Indications:              Screening for colorectal malignant neoplasm Medicines:                Monitored Anesthesia Care Procedure:                Pre-Anesthesia Assessment:                           - Prior to the procedure, a History and Physical                            was performed, and patient medications and                            allergies were reviewed. The patient's tolerance of                            previous anesthesia was also reviewed. The risks                            and benefits of the procedure and the sedation                            options and risks were discussed with the patient.                            All questions were answered, and informed consent                            was obtained. Prior Anticoagulants: The patient has                            taken no previous anticoagulant or antiplatelet                            agents. ASA Grade Assessment: II - A patient with                            mild systemic disease. After reviewing the risks                            and benefits, the patient was deemed in                            satisfactory condition to undergo the procedure.                           After obtaining informed consent, the colonoscope  was passed under direct vision. Throughout the                            procedure, the patient's blood pressure, pulse, and                            oxygen saturations were monitored continuously. The                            Colonoscope was introduced through the anus and                            advanced to the the cecum, identified by                            appendiceal orifice and  ileocecal valve. The                            colonoscopy was performed without difficulty. The                            patient tolerated the procedure. The quality of the                            bowel preparation was adequate. The ileocecal                            valve, appendiceal orifice, and rectum were                            photographed. Scope In: 12:17:40 PM Scope Out: 12:42:18 PM Scope Withdrawal Time: 0 hours 21 minutes 12 seconds  Total Procedure Duration: 0 hours 24 minutes 38 seconds  Findings:                 The digital rectal exam findings include                            hemorrhoids. Pertinent negatives include no                            palpable rectal lesions.                           A frond-like/villous, fungating, infiltrative and                            ulcerated non-obstructing large mass was found in                            the mid ascending colon. The mass was partially                            circumferential (involving one-third of the lumen  circumference). The mass measured at least five cm                            in length. Oozing was present. Biopsies were taken                            with a cold forceps for histology. Area distal to                            the mass was tattooed with an injection of Spot                            (carbon black).                           A 18 mm polyp was found in the recto-sigmoid colon.                            The polyp was pedunculated and its head was                            somewhat concerning in appearance endoscopically.                            Preparations were made for mucosal resection. NBI                            and White-light imaging was done to demarcate the                            borders of the lesion. Saline was injected to raise                            the lesion to allow maximum base removal. Snare                             mucosal resection was performed. Resection and                            retrieval were complete. Area was tattooed with an                            injection of Spot (carbon black) adjacent to the                            resection site.                           Three sessile polyps were found in the                            recto-sigmoid colon, sigmoid colon and ascending  colon. The polyps were 2 to 4 mm in size. These                            polyps were removed with a cold snare. Resection                            and retrieval were complete.                           Multiple small-mouthed diverticula were found in                            the recto-sigmoid colon, sigmoid colon and                            descending colon.                           Normal mucosa was found in the entire colon                            otherwise.                           Non-bleeding non-thrombosed internal hemorrhoids                            were found during retroflexion, during perianal                            exam and during digital exam. The hemorrhoids were                            Grade II (internal hemorrhoids that prolapse but                            reduce spontaneously). Complications:            No immediate complications. Estimated Blood Loss:     Estimated blood loss was minimal. Impression:               - Hemorrhoids found on digital rectal exam.                           - Rule out malignancy, tumor in the mid ascending                            colon. Biopsied. Tattooed.                           - One 18 mm polyp at the recto-sigmoid colon,                            removed with mucosal resection. Resected and  retrieved. Tattooed.                           - Three 2 to 4 mm polyps at the recto-sigmoid                            colon, in the sigmoid colon and in the ascending                             colon, removed with a cold snare. Resected and                            retrieved.                           - Diverticulosis in the recto-sigmoid colon, in the                            sigmoid colon and in the descending colon.                           - Normal mucosa in the entire examined colon                            otherwise.                           - Non-bleeding non-thrombosed internal hemorrhoids. Recommendation:           - The patient will be observed post-procedure,                            until all discharge criteria are met.                           - Discharge patient to home.                           - Patient has a contact number available for                            emergencies. The signs and symptoms of potential                            delayed complications were discussed with the                            patient. Return to normal activities tomorrow.                            Written discharge instructions were provided to the                            patient.                           -  High fiber diet.                           - Continue present medications.                           - Await pathology results.                           - Repeat colonoscopy in 1 year for surveillance.                           - Proceed with obtaining labs today -                            CBC/CMP/INR/CEA.                           - Proceed with scheduling CT-Chest/Abdomen/Pelvis                            with IV/PO contrast for further evaluation and                            staging of likely malignancy.                           - Referrals to Oncology/Surgery will be placed                            after pathology has finalized.                           - The findings and recommendations were discussed                            with the patient.                           - The findings and recommendations were discussed                             with the patient's family. Justice Britain, MD 07/29/2020 12:59:36 PM

## 2020-07-30 ENCOUNTER — Other Ambulatory Visit: Payer: Self-pay

## 2020-07-30 DIAGNOSIS — D509 Iron deficiency anemia, unspecified: Secondary | ICD-10-CM

## 2020-07-30 LAB — CEA: CEA: 2 ng/mL

## 2020-08-02 ENCOUNTER — Telehealth: Payer: Self-pay | Admitting: Gastroenterology

## 2020-08-02 ENCOUNTER — Telehealth: Payer: Self-pay | Admitting: *Deleted

## 2020-08-02 ENCOUNTER — Other Ambulatory Visit: Payer: Self-pay

## 2020-08-02 DIAGNOSIS — K6389 Other specified diseases of intestine: Secondary | ICD-10-CM

## 2020-08-02 NOTE — Telephone Encounter (Signed)
  Follow up Call-  Call back number 07/29/2020  Post procedure Call Back phone  # [phone]  Permission to leave phone message Yes  Some recent data might be hidden     Patient questions:  Do you have a fever, pain , or abdominal swelling? No. Pain Score  0 *  Have you tolerated food without any problems? Yes.    Have you been able to return to your normal activities? Yes.    Do you have any questions about your discharge instructions: Diet   No. Medications  No. Follow up visit  No.  Do you have questions or concerns about your Care? No.  Actions: * If pain score is 4 or above: No action needed, pain <4.  1. Have you developed a fever since your procedure? no  2.   Have you had an respiratory symptoms (SOB or cough) since your procedure? no  3.   Have you tested positive for COVID 19 since your procedure no  4.   Have you had any family members/close contacts diagnosed with the COVID 19 since your procedure?  no   If yes to any of these questions please route to Joylene John, RN and Joella Prince, RN

## 2020-08-02 NOTE — Telephone Encounter (Signed)
The patient has been notified of this information and all questions answered. The pt will come in this week for labs      Carlos Miranda, Please let the patient know that I have reviewed his recent labs obtained after his colonoscopy. His pathology still pending as of this time but we will certainly let him know once the results come back. He does have evidence of new anemia. I would try to add on iron/TIBC/ferritin/B12/folate to his labs if possible if not he can come in next week to get those done and see if he needs to be started on iron repletion. Thanks. GM

## 2020-08-02 NOTE — Telephone Encounter (Signed)
Pt is requesting a call back from a nurse to discuss the lab orders Dr Richardean Sale requested.

## 2020-08-02 NOTE — Telephone Encounter (Signed)
The pt has been scheduled for 08/10/20 at 4 pm at [facility] radiology.  NPO 4 hours prior and pt to pick up contrast at least 2 days prior.  I spoke with the pt and he request that instructions should be sent to My Chart.  All information sent as requested.

## 2020-08-04 ENCOUNTER — Telehealth: Payer: Self-pay | Admitting: Gastroenterology

## 2020-08-04 NOTE — Telephone Encounter (Signed)
Received call from Pathology. Colon Mass biopsies consistent with Adenocarcinoma. This will be signed out today or tomorrow.  I tried calling patient home and mobile numbers and left number for callback.  When I was done with the patient's procedure I had high concern for malignancy and that is why we were moving forward with getting his labs and his CT scans set up.  I will try and call the patient on Thursday when I return to the office.  In interim if he calls the office, RN Gerarda Fraction, if you could please let the patient know his results and I will be happy to call and talk with him further thereafter.  He needs to have his CT scans completed.  Thanks to all.  GM

## 2020-08-05 ENCOUNTER — Other Ambulatory Visit: Payer: Self-pay

## 2020-08-05 ENCOUNTER — Encounter: Payer: Self-pay | Admitting: Gastroenterology

## 2020-08-05 ENCOUNTER — Other Ambulatory Visit (INDEPENDENT_AMBULATORY_CARE_PROVIDER_SITE_OTHER): Payer: BLUE CROSS/BLUE SHIELD

## 2020-08-05 DIAGNOSIS — D509 Iron deficiency anemia, unspecified: Secondary | ICD-10-CM

## 2020-08-05 DIAGNOSIS — C189 Malignant neoplasm of colon, unspecified: Secondary | ICD-10-CM

## 2020-08-05 LAB — IBC + FERRITIN
Ferritin: 3.1 ng/mL — ABNORMAL LOW (ref 22.0–322.0)
Iron: 15 ug/dL — ABNORMAL LOW (ref 42–165)
Saturation Ratios: 3.1 % — ABNORMAL LOW (ref 20.0–50.0)
Transferrin: 345 mg/dL (ref 212.0–360.0)

## 2020-08-05 LAB — FOLATE: Folate: 15.3 ng/mL (ref >5.9)

## 2020-08-05 LAB — VITAMIN B12: Vitamin B-12: 224 pg/mL (ref 211–911)

## 2020-08-05 NOTE — Telephone Encounter (Signed)
I was able to reach the patient today and go over the results of his pathology. As was expected, confirmation of the right-sided adenocarcinoma was present.  The rest of the colon polyps were precancerous but removed. Patient is coming in for labs later today to check for anemia studies in the setting of his new anemia which I suspect is iron deficiency. Unfortunately, the patient was just terminated from his job. He is going to have insurance for at least the next 30 days and is hopeful to get [employer] but is going to have some financial issues moving forward so we should try and get things moving as quickly as possible. He is scheduled for his CT scans next week which is good.  Patty or covering RN please go ahead and place referral to oncology and surgery. RN Ouida Sills, if you can work on trying to reach out to the patient and see how we may be able to help him since finances are going to be significant and I would hate to have this patient be lost or not be able to get the care he needs since he just got terminated and is going to be losing insurance soon. Appreciate everyone's help. I will be in touch with the patient once the labs return in the likelihood of initiating oral iron. I have spoken with the patient's dad for whom I was given permission by the patient to tell any of his medical history as his underlying seizure disorder has prevented him from being able to be as functional as he would normally want to be.  Justice Britain, MD [facility] Office # [phone]

## 2020-08-05 NOTE — Telephone Encounter (Signed)
Referral entered in epic for oncology, referral faxed to [facility].

## 2020-08-06 NOTE — Progress Notes (Signed)
Let a voice message for patient to call me back on my direct line regarding resources to address his financial needs regarding his treatment for newly diagnosed colon cancer (per Dr. Rush Landmark at [facility]).

## 2020-08-09 ENCOUNTER — Telehealth: Payer: Self-pay | Admitting: Gastroenterology

## 2020-08-09 ENCOUNTER — Other Ambulatory Visit: Payer: Self-pay

## 2020-08-09 DIAGNOSIS — D509 Iron deficiency anemia, unspecified: Secondary | ICD-10-CM

## 2020-08-09 DIAGNOSIS — K6389 Other specified diseases of intestine: Secondary | ICD-10-CM

## 2020-08-09 NOTE — Telephone Encounter (Signed)
Patient is returning your call in lab results

## 2020-08-09 NOTE — Telephone Encounter (Signed)
  The patient has been notified of this information and all questions answered. The pt will begin iron and repeat labs.  Entered   Mansouraty, Telford Nab., MD  Timothy Lasso, RN Joby Richart, Please let the patient know that his anemia is iron deficiency as expected in the setting of his newly diagnosed colon cancer. Needs to be initiated on oral iron supplementation ferrous gluconate 324 mg once daily. Plan for repeat iron studies and CBC in 6 weeks. If he continues to have iron deficiency, he will need IV iron infusions. Thanks. GM

## 2020-08-10 ENCOUNTER — Ambulatory Visit (HOSPITAL_COMMUNITY)
Admission: RE | Admit: 2020-08-10 | Discharge: 2020-08-10 | Disposition: A | Discharge status: Home / Self Care | Payer: BLUE CROSS/BLUE SHIELD | Source: Ambulatory Visit | Attending: Gastroenterology | Admitting: Gastroenterology

## 2020-08-10 ENCOUNTER — Other Ambulatory Visit: Payer: Self-pay

## 2020-08-10 DIAGNOSIS — C182 Malignant neoplasm of ascending colon: Secondary | ICD-10-CM | POA: Diagnosis not present

## 2020-08-10 DIAGNOSIS — K6389 Other specified diseases of intestine: Secondary | ICD-10-CM | POA: Diagnosis not present

## 2020-08-10 DIAGNOSIS — K5790 Diverticulosis of intestine, part unspecified, without perforation or abscess without bleeding: Secondary | ICD-10-CM | POA: Diagnosis not present

## 2020-08-10 DIAGNOSIS — R918 Other nonspecific abnormal finding of lung field: Secondary | ICD-10-CM | POA: Diagnosis not present

## 2020-08-10 DIAGNOSIS — R590 Localized enlarged lymph nodes: Secondary | ICD-10-CM | POA: Diagnosis not present

## 2020-08-10 MED ORDER — IOHEXOL 300 MG/ML  SOLN
100.0000 mL | Freq: Once | INTRAMUSCULAR | Status: AC | PRN
  Administered 2020-08-10: 100 mL via INTRAVENOUS

## 2020-08-12 NOTE — Telephone Encounter (Signed)
Carlos Miranda, I have released a message to the patient via MyChart to explain the CT scan results. If you could reach out to [facility] surgery and see if there is any availability sooner with one of the colorectal surgeons that would be great since his insurance is ending at the end of the month? Thank you. GM

## 2020-08-16 ENCOUNTER — Other Ambulatory Visit: Payer: BLUE CROSS/BLUE SHIELD

## 2020-08-17 ENCOUNTER — Other Ambulatory Visit: Payer: Self-pay

## 2020-08-17 ENCOUNTER — Ambulatory Visit (INDEPENDENT_AMBULATORY_CARE_PROVIDER_SITE_OTHER): Payer: BLUE CROSS/BLUE SHIELD | Admitting: Neurology

## 2020-08-17 DIAGNOSIS — G40009 Localization-related (focal) (partial) idiopathic epilepsy and epileptic syndromes with seizures of localized onset, not intractable, without status epilepticus: Secondary | ICD-10-CM

## 2020-08-17 DIAGNOSIS — R400 Somnolence: Secondary | ICD-10-CM

## 2020-08-29 ENCOUNTER — Encounter (HOSPITAL_COMMUNITY): Payer: Self-pay

## 2020-08-29 ENCOUNTER — Emergency Department (HOSPITAL_COMMUNITY): Payer: BLUE CROSS/BLUE SHIELD

## 2020-08-29 ENCOUNTER — Other Ambulatory Visit: Payer: Self-pay

## 2020-08-29 ENCOUNTER — Emergency Department (HOSPITAL_COMMUNITY)
Admission: EM | Admit: 2020-08-29 | Discharge: 2020-08-29 | Disposition: A | Discharge status: Home / Self Care | Payer: BLUE CROSS/BLUE SHIELD | Attending: Emergency Medicine | Admitting: Emergency Medicine

## 2020-08-29 DIAGNOSIS — K6389 Other specified diseases of intestine: Secondary | ICD-10-CM | POA: Diagnosis not present

## 2020-08-29 DIAGNOSIS — Z85038 Personal history of other malignant neoplasm of large intestine: Secondary | ICD-10-CM | POA: Insufficient documentation

## 2020-08-29 DIAGNOSIS — K7689 Other specified diseases of liver: Secondary | ICD-10-CM | POA: Diagnosis not present

## 2020-08-29 DIAGNOSIS — R109 Unspecified abdominal pain: Secondary | ICD-10-CM | POA: Insufficient documentation

## 2020-08-29 DIAGNOSIS — M545 Low back pain, unspecified: Secondary | ICD-10-CM | POA: Diagnosis not present

## 2020-08-29 DIAGNOSIS — M549 Dorsalgia, unspecified: Secondary | ICD-10-CM | POA: Diagnosis not present

## 2020-08-29 DIAGNOSIS — M5459 Other low back pain: Secondary | ICD-10-CM | POA: Diagnosis not present

## 2020-08-29 DIAGNOSIS — Z79899 Other long term (current) drug therapy: Secondary | ICD-10-CM | POA: Diagnosis not present

## 2020-08-29 DIAGNOSIS — C189 Malignant neoplasm of colon, unspecified: Secondary | ICD-10-CM | POA: Diagnosis not present

## 2020-08-29 DIAGNOSIS — I10 Essential (primary) hypertension: Secondary | ICD-10-CM | POA: Diagnosis not present

## 2020-08-29 HISTORY — DX: Malignant (primary) neoplasm, unspecified: C80.1

## 2020-08-29 LAB — COMPREHENSIVE METABOLIC PANEL
ALT: 16 U/L (ref 0–44)
AST: 17 U/L (ref 15–41)
Albumin: 4.4 g/dL (ref 3.5–5.0)
Alkaline Phosphatase: 85 U/L (ref 38–126)
Anion gap: 9 (ref 5–15)
BUN: 16 mg/dL (ref 6–20)
CO2: 23 mmol/L (ref 22–32)
Calcium: 8.9 mg/dL (ref 8.9–10.3)
Chloride: 101 mmol/L (ref 98–111)
Creatinine, Ser: 0.9 mg/dL (ref 0.61–1.24)
GFR, Estimated: >60 mL/min (ref >60)
Glucose, Bld: 104 mg/dL — ABNORMAL HIGH (ref 70–99)
Potassium: 3.9 mmol/L (ref 3.5–5.1)
Sodium: 133 mmol/L — ABNORMAL LOW (ref 135–145)
Total Bilirubin: 0.5 mg/dL (ref 0.3–1.2)
Total Protein: 8 g/dL (ref 6.5–8.1)

## 2020-08-29 LAB — CBC WITH DIFFERENTIAL/PLATELET
Abs Immature Granulocytes: 0.01 10*3/uL (ref 0.00–0.07)
Basophils Absolute: 0.1 10*3/uL (ref 0.0–0.1)
Basophils Relative: 1 %
Eosinophils Absolute: 0.3 10*3/uL (ref 0.0–0.5)
Eosinophils Relative: 4 %
HCT: 39.7 % (ref 39.0–52.0)
Hemoglobin: 11.4 g/dL — ABNORMAL LOW (ref 13.0–17.0)
Immature Granulocytes: 0 %
Lymphocytes Relative: 23 %
Lymphs Abs: 1.5 10*3/uL (ref 0.7–4.0)
MCH: 23.1 pg — ABNORMAL LOW (ref 26.0–34.0)
MCHC: 28.7 g/dL — ABNORMAL LOW (ref 30.0–36.0)
MCV: 80.5 fL (ref 80.0–100.0)
Monocytes Absolute: 0.5 10*3/uL (ref 0.1–1.0)
Monocytes Relative: 8 %
Neutro Abs: 4 10*3/uL (ref 1.7–7.7)
Neutrophils Relative %: 64 %
Platelets: 378 10*3/uL (ref 150–400)
RBC: 4.93 MIL/uL (ref 4.22–5.81)
RDW: 19.8 % — ABNORMAL HIGH (ref 11.5–15.5)
WBC: 6.3 10*3/uL (ref 4.0–10.5)
nRBC: 0 % (ref 0.0–0.2)

## 2020-08-29 LAB — URINALYSIS, ROUTINE W REFLEX MICROSCOPIC
Bilirubin Urine: NEGATIVE
Glucose, UA: NEGATIVE mg/dL
Hgb urine dipstick: NEGATIVE
Ketones, ur: NEGATIVE mg/dL
Leukocytes,Ua: NEGATIVE
Nitrite: NEGATIVE
Protein, ur: 30 mg/dL — AB
Specific Gravity, Urine: 1.031 — ABNORMAL HIGH (ref 1.005–1.030)
pH: 5 (ref 5.0–8.0)

## 2020-08-29 MED ORDER — OXYCODONE-ACETAMINOPHEN 5-325 MG PO TABS
1.0000 | ORAL_TABLET | Freq: Once | ORAL | Status: AC
  Administered 2020-08-29: 1 via ORAL
  Filled 2020-08-29: qty 1

## 2020-08-29 MED ORDER — METHOCARBAMOL 500 MG PO TABS: 500.0000 mg | ORAL_TABLET | Freq: Two times a day (BID) | ORAL | 0 refills | Status: DC

## 2020-08-29 MED ORDER — KETOROLAC TROMETHAMINE 30 MG/ML IJ SOLN
15.0000 mg | Freq: Once | INTRAMUSCULAR | Status: AC
  Administered 2020-08-29: 15 mg via INTRAVENOUS
  Filled 2020-08-29: qty 1
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
## 2020-08-29 NOTE — ED Triage Notes (Signed)
Pt presents to ED with complaints of lower right back pain since 1900 last night. Pt denies urinary symptoms and fever.

## 2020-08-29 NOTE — Discharge Instructions (Addendum)
You were seen here today for Back Pain: Low back pain is discomfort in the lower back that may be due to injuries to muscles and ligaments around the spine. Occasionally, it may be caused by a problem to a part of the spine called a disc. Your back pain should be treated with medicines listed below as well as back exercises and this back pain should get better over the next 2 weeks. Most patients get completely well in 4 weeks. It is important to know however, if you develop severe or worsening pain, low back pain with fever, numbness, weakness or inability to walk or urinate, you should return to the ER immediately.  Please follow up with your doctor this week for a recheck if still having symptoms.  HOME INSTRUCTIONS Self - care:  The application of heat can help soothe the pain.  Maintaining your daily activities, including walking (this is encouraged), as it will help you get better faster than just staying in bed. Do not life, push, pull anything more than 10 pounds for the next week. I am attaching back exercises that you can do at home to help facilitate your recovery.   Medications are also useful to help with pain control.    Acetaminophen.  This medication is generally safe, and found over the counter. Take as directed for your age. You should not take more than 8 of the extra strength (500mg ) pills a day (max dose is 4000mg  total OVER one day)  Non steroidal anti inflammatory: This includes medications including Ibuprofen, naproxen and Mobic; These medications help both pain and swelling and are very useful in treating back pain.  They should be taken with food, as they can cause stomach upset, and more seriously, stomach bleeding. Do not combine the medications.  Lidocaine Patch: Salon Pas lidocaine patches (blue and silver box) can be purchased over the counter and worn for 12 hours for local pain relief   Muscle relaxants:  These medications can help with muscle tightness that is a cause  of lower back pain.  Most of these medications can cause drowsiness, and it is not safe to drive or use dangerous machinery while taking them. They are primarily helpful when taken at night before sleep.   You will need to follow up with your primary healthcare provider  in 1-2 weeks for reassessment and persistent symptoms.  Be aware that if you develop new symptoms, such as a fever, leg weakness, difficulty with or loss of control of your urine or bowels, abdominal pain, or more severe pain, you will need to seek medical attention and/or return to the Emergency department. Additional Information:  Your vital signs today were: BP (!) 141/90   Pulse 78   Temp 98 F (36.7 C) (Oral)   Resp (!) 21   Ht 5\' 8"  (1.727 m)   Wt 108.9 kg   SpO2 99%   BMI 36.49 kg/m  If your blood pressure (BP) was elevated above 135/85 this visit, please have this repeated by your doctor within one month. ---------------

## 2020-08-30 LAB — URINE CULTURE: Culture: <10000 — AB

## 2020-08-30 NOTE — Procedures (Signed)
ELECTROENCEPHALOGRAM REPORT  Dates of Recording: 08/17/2020 11:22AM to 08/19/2020 11:37AM  Patient's Name: Carlos Miranda MRN: [id number] Date of Birth: 1961/01/19  Referring Provider: Dr. Ellouise Newer  Procedure: 48-hour ambulatory video EEG  History: This is a 59 year old man with a history of staring with automatisms, loss of consciousness. EEG for classification.  Medications:  Keppra Lamictal Pravastatin Lisinopril  Technical Summary: This is a 48-hour multichannel digital video EEG recording measured by the international 10-20 system with electrodes applied with paste and impedances below 5000 ohms performed as portable with EKG monitoring.  The digital EEG was referentially recorded, reformatted, and digitally filtered in a variety of bipolar and referential montages for optimal display.    DESCRIPTION OF RECORDING: During maximal wakefulness, the background activity consisted of a symmetric 11 Hz posterior dominant rhythm which was reactive to eye opening. The record was symmetric. There were occasional left frontotemporal epileptiform discharges seen.   During the recording, the patient progresses through wakefulness, drowsiness, and Stage 2 sleep.  Again, occasional left frontotemporal epileptiform discharges were seen in sleep.   Events: Diary not completed. No push button events.  There were no electrographic seizures seen.  EKG lead was unremarkable.  IMPRESSION: This 48-hour ambulatory video EEG study is abnormal due to occasional left frontotemporal epileptiform discharges.  CLINICAL CORRELATION of the above findings indicates a tendency for seizures to arise from the left frontotemporal region. There were no clinical or subclinical seizures seen. If further clinical questions remain, inpatient video EEG monitoring may be helpful.   Ellouise Newer, M.D.

## 2020-08-31 NOTE — Telephone Encounter (Signed)
No sorry I was trying to send the [facility] appt date to the pt.  You can ignore the message.

## 2020-08-31 NOTE — Telephone Encounter (Signed)
It seems that the patient's [place] surgery visit did not occur for some reason. I will have my team work on trying to find out what has happened. Not ordering a PET/CT at this time as that is usually deferred to oncologist and surgeons depending on how they see/interpret the CT imaging.  Justice Britain, MD [facility] # [phone]

## 2020-09-03 ENCOUNTER — Telehealth: Payer: Self-pay | Admitting: Neurology

## 2020-09-03 NOTE — Telephone Encounter (Signed)
Discussed 48-hour EEG showing left anterior temporal epileptiform discharges. He has not had any seizures that he is aware of, taking Lamotrigine ER 200mg  daily and Levetiracetam ER 1000mg  daily. Continue current medications for now. He provides additional information that he will be seeing surgeon for colon cancer. F/u as scheduled in Dec.

## 2020-09-06 ENCOUNTER — Ambulatory Visit: Payer: Self-pay | Admitting: General Surgery
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
## 2020-09-26 DIAGNOSIS — B029 Zoster without complications: Secondary | ICD-10-CM | POA: Diagnosis not present

## 2020-09-27 ENCOUNTER — Other Ambulatory Visit: Payer: Self-pay | Admitting: Family Medicine

## 2020-09-28 NOTE — Telephone Encounter (Signed)
Patient reached out and hadn't heard about colon surgery being scheduled. I replied to [employer]. I'll send message to Dr. Marcello Moores to see how her staff can work with patient to get his interventions completed.  Justice Britain, MD [facility] # [phone]

## 2020-10-05 DIAGNOSIS — B029 Zoster without complications: Secondary | ICD-10-CM | POA: Diagnosis not present

## 2020-10-06 ENCOUNTER — Encounter: Payer: Self-pay | Admitting: Family Medicine

## 2020-10-27 ENCOUNTER — Other Ambulatory Visit: Payer: Self-pay

## 2020-10-27 ENCOUNTER — Encounter: Payer: Self-pay | Admitting: Neurology

## 2020-10-27 ENCOUNTER — Ambulatory Visit (INDEPENDENT_AMBULATORY_CARE_PROVIDER_SITE_OTHER): Payer: BLUE CROSS/BLUE SHIELD | Admitting: Neurology

## 2020-10-27 VITALS — BP 151/91 | HR 91 | Resp 20 | Wt 233.0 lb

## 2020-10-27 DIAGNOSIS — G40009 Localization-related (focal) (partial) idiopathic epilepsy and epileptic syndromes with seizures of localized onset, not intractable, without status epilepticus: Secondary | ICD-10-CM | POA: Diagnosis not present

## 2020-10-27 MED ORDER — LAMOTRIGINE ER 300 MG PO TB24: ORAL_TABLET | ORAL | 11 refills | Status: DC
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
## 2020-10-28 NOTE — Patient Instructions (Addendum)
DUE TO COVID-19 ONLY ONE VISITOR IS ALLOWED TO COME WITH YOU AND STAY IN THE WAITING ROOM ONLY DURING PRE OP AND PROCEDURE DAY OF SURGERY. THE 1 VISITOR  MAY VISIT WITH YOU AFTER SURGERY IN YOUR PRIVATE ROOM DURING VISITING HOURS ONLY!  YOU NEED TO HAVE A COVID 19 TEST ON__1/10_____ @_2 :55 pm______, THIS TEST MUST BE DONE BEFORE SURGERY,  COVID TESTING SITE [place] [place] [address], IT IS ON THE RIGHT GOING OUT [address] APPROXIMATELY  2 MINUTES PAST [employer] ON THE RIGHT. ONCE YOUR COVID TEST IS COMPLETED,  PLEASE BEGIN THE QUARANTINE INSTRUCTIONS AS OUTLINED IN YOUR HANDOUT.                Carlos Miranda    Your procedure is scheduled on: 11/10/20   Report to [facility] Main  Entrance   Report to admitting at 11:00 AM     Call this number if you have problems the morning of surgery [phone]   Follow all instructions from Dr. Manon Hilding office for bowel prep.  Drink plenty of fluids the day before surgery to prevent dehydration.  DRINK 2 PRESURGERY ENSURE DRINKS THE NIGHT BEFORE SURGERY AT 10:00 PM    NO SOLIDS AFTER MIDNIGHT THE DAY PRIOR TO THE SURGERY   NOTHING BY MOUTH EXCEPT CLEAR LIQUIDS UNTIL : 10:00 AM   PLEASE FINISH PRESURGERY ENSURE DRINK PER SURGEON BY 10:00 AM    . BRUSH YOUR TEETH MORNING OF SURGERY AND RINSE YOUR MOUTH OUT, NO CHEWING GUM CANDY OR MINTS.     Take these medicines the morning of surgery with A SIP OF WATER: None                                 You may not have any metal on your body including              piercings  Do not wear jewelry,  lotions, powders or deodorant               Men may shave face and neck.   Do not bring valuables to the hospital. [place].  Contacts, dentures or bridgework may not be worn into surgery.                  [place] - Preparing for Surgery  Before surgery, you can play an important role.   Because skin  is not sterile, your skin needs to be as free of germs as possible.   You can reduce the number of germs on your skin by washing with CHG (chlorahexidine gluconate) soap before surgery .  CHG is an antiseptic cleaner which kills germs and bonds with the skin to continue killing germs even after washing. Please DO NOT use if you have an allergy to CHG or antibacterial soaps.   If your skin becomes reddened/irritated stop using the CHG and inform your nurse when you arrive at Short Stay. You may shave your face/neck.  Please follow these instructions carefully:  1.  Shower with CHG Soap the night before surgery and the  morning of Surgery.  2.  If you choose to wash your hair, wash your hair first as usual with your  normal  shampoo.  3.  After you shampoo, rinse your hair  and body thoroughly to remove the  shampoo.                                        4.  Use CHG as you would any other liquid soap.  You can apply chg directly  to the skin and wash                       Gently with a scrungie or clean washcloth.  5.  Apply the CHG Soap to your body ONLY FROM THE NECK DOWN.   Do not use on face/ open                           Wound or open sores. Avoid contact with eyes, ears mouth and genitals (private parts).                       Wash face,  Genitals (private parts) with your normal soap.             6.  Wash thoroughly, paying special attention to the area where your surgery  will be performed.  7.  Thoroughly rinse your body with warm water from the neck down.  8.  DO NOT shower/wash with your normal soap after using and rinsing off  the CHG Soap.             9.  Pat yourself dry with a clean towel.            10.  Wear clean pajamas.            11.  Place clean sheets on your bed the night of your first shower and do not  sleep with pets. Day of Surgery : Do not apply any lotions/deodorants the morning of surgery.  Please wear clean clothes to the hospital/surgery center.  FAILURE TO  FOLLOW THESE INSTRUCTIONS MAY RESULT IN THE CANCELLATION OF YOUR SURGERY PATIENT SIGNATURE_________________________________  NURSE SIGNATURE__________________________________  ________________________________________________________________________   Carlos Miranda  An incentive spirometer is a tool that can help keep your lungs clear and active. This tool measures how well you are filling your lungs with each breath. Taking long deep breaths may help reverse or decrease the chance of developing breathing (pulmonary) problems (especially infection) following:  A long period of time when you are unable to move or be active. BEFORE THE PROCEDURE   If the spirometer includes an indicator to show your best effort, your nurse or respiratory therapist will set it to a desired goal.  If possible, sit up straight or lean slightly forward. Try not to slouch.  Hold the incentive spirometer in an upright position. INSTRUCTIONS FOR USE  1. Sit on the edge of your bed if possible, or sit up as far as you can in bed or on a chair. 2. Hold the incentive spirometer in an upright position. 3. Breathe out normally. 4. Place the mouthpiece in your mouth and seal your lips tightly around it. 5. Breathe in slowly and as deeply as possible, raising the piston or the ball toward the top of the column. 6. Hold your breath for 3-5 seconds or for as long as possible. Allow the piston or ball to fall to the bottom of the column. 7. Remove the mouthpiece from your mouth and breathe out normally.  8. Rest for a few seconds and repeat Steps 1 through 7 at least 10 times every 1-2 hours when you are awake. Take your time and take a few normal breaths between deep breaths. 9. The spirometer may include an indicator to show your best effort. Use the indicator as a goal to work toward during each repetition. 10. After each set of 10 deep breaths, practice coughing to be sure your lungs are clear. If you have an  incision (the cut made at the time of surgery), support your incision when coughing by placing a pillow or rolled up towels firmly against it. Once you are able to get out of bed, walk around indoors and cough well. You may stop using the incentive spirometer when instructed by your caregiver.  RISKS AND COMPLICATIONS  Take your time so you do not get dizzy or light-headed.  If you are in pain, you may need to take or ask for pain medication before doing incentive spirometry. It is harder to take a deep breath if you are having pain. AFTER USE  Rest and breathe slowly and easily.  It can be helpful to keep track of a log of your progress. Your caregiver can provide you with a simple table to help with this. If you are using the spirometer at home, follow these instructions: SEEK MEDICAL CARE IF:   You are having difficultly using the spirometer.  You have trouble using the spirometer as often as instructed.  Your pain medication is not giving enough relief while using the spirometer.  You develop fever of 100.5 F (38.1 C) or higher. SEEK IMMEDIATE MEDICAL CARE IF:   You cough up bloody sputum that had not been present before.  You develop fever of 102 F (38.9 C) or greater.  You develop worsening pain at or near the incision site. MAKE SURE YOU:   Understand these instructions.  Will watch your condition.  Will get help right away if you are not doing well or get worse. Document Released: 02/26/2007 Document Revised: 01/08/2012 Document Reviewed: 04/29/2007 [facility] Patient Information 2014 [place], [place].   ________________________________________________________________________

## 2020-11-01 ENCOUNTER — Encounter (HOSPITAL_COMMUNITY)
Admission: RE | Admit: 2020-11-01 | Discharge: 2020-11-01 | Disposition: A | Discharge status: Home / Self Care | Payer: BLUE CROSS/BLUE SHIELD | Source: Ambulatory Visit | Attending: General Surgery | Admitting: General Surgery

## 2020-11-01 ENCOUNTER — Other Ambulatory Visit: Payer: Self-pay

## 2020-11-01 ENCOUNTER — Encounter (HOSPITAL_COMMUNITY): Payer: Self-pay

## 2020-11-01 DIAGNOSIS — Z01818 Encounter for other preprocedural examination: Secondary | ICD-10-CM | POA: Diagnosis not present

## 2020-11-01 LAB — CBC
HCT: 45 % (ref 39.0–52.0)
Hemoglobin: 13.7 g/dL (ref 13.0–17.0)
MCH: 26.7 pg (ref 26.0–34.0)
MCHC: 30.4 g/dL (ref 30.0–36.0)
MCV: 87.5 fL (ref 80.0–100.0)
Platelets: 270 10*3/uL (ref 150–400)
RBC: 5.14 MIL/uL (ref 4.22–5.81)
RDW: 20.7 % — ABNORMAL HIGH (ref 11.5–15.5)
WBC: 4.9 10*3/uL (ref 4.0–10.5)
nRBC: 0 % (ref 0.0–0.2)
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
## 2020-11-08 ENCOUNTER — Other Ambulatory Visit (HOSPITAL_COMMUNITY)
Admission: RE | Admit: 2020-11-08 | Discharge: 2020-11-08 | Disposition: A | Discharge status: Home / Self Care | Payer: BLUE CROSS/BLUE SHIELD | Source: Ambulatory Visit | Attending: General Surgery | Admitting: General Surgery

## 2020-11-08 DIAGNOSIS — Z01812 Encounter for preprocedural laboratory examination: Secondary | ICD-10-CM | POA: Insufficient documentation

## 2020-11-08 DIAGNOSIS — Z20822 Contact with and (suspected) exposure to covid-19: Secondary | ICD-10-CM | POA: Diagnosis not present

## 2020-11-08 DIAGNOSIS — Z79899 Other long term (current) drug therapy: Secondary | ICD-10-CM | POA: Diagnosis not present

## 2020-11-08 DIAGNOSIS — K660 Peritoneal adhesions (postprocedural) (postinfection): Secondary | ICD-10-CM | POA: Diagnosis not present

## 2020-11-08 DIAGNOSIS — I10 Essential (primary) hypertension: Secondary | ICD-10-CM | POA: Diagnosis not present

## 2020-11-08 DIAGNOSIS — Z803 Family history of malignant neoplasm of breast: Secondary | ICD-10-CM | POA: Diagnosis not present

## 2020-11-08 DIAGNOSIS — R339 Retention of urine, unspecified: Secondary | ICD-10-CM | POA: Diagnosis not present

## 2020-11-08 DIAGNOSIS — Z880 Allergy status to penicillin: Secondary | ICD-10-CM | POA: Diagnosis not present

## 2020-11-08 DIAGNOSIS — Z5331 Laparoscopic surgical procedure converted to open procedure: Secondary | ICD-10-CM | POA: Diagnosis not present

## 2020-11-08 DIAGNOSIS — G4730 Sleep apnea, unspecified: Secondary | ICD-10-CM | POA: Diagnosis not present

## 2020-11-08 DIAGNOSIS — Z882 Allergy status to sulfonamides status: Secondary | ICD-10-CM | POA: Diagnosis not present

## 2020-11-08 DIAGNOSIS — C182 Malignant neoplasm of ascending colon: Secondary | ICD-10-CM | POA: Diagnosis not present

## 2020-11-08 DIAGNOSIS — Z8261 Family history of arthritis: Secondary | ICD-10-CM | POA: Diagnosis not present

## 2020-11-08 DIAGNOSIS — C772 Secondary and unspecified malignant neoplasm of intra-abdominal lymph nodes: Secondary | ICD-10-CM | POA: Diagnosis not present

## 2020-11-08 DIAGNOSIS — G40802 Other epilepsy, not intractable, without status epilepticus: Secondary | ICD-10-CM | POA: Diagnosis not present

## 2020-11-08 DIAGNOSIS — Z888 Allergy status to other drugs, medicaments and biological substances status: Secondary | ICD-10-CM | POA: Diagnosis not present

## 2020-11-08 LAB — SARS CORONAVIRUS 2 (TAT 6-24 HRS): SARS Coronavirus 2: NEGATIVE

## 2020-11-09 MED ORDER — BUPIVACAINE LIPOSOME 1.3 % IJ SUSP
20.0000 mL | Freq: Once | INTRAMUSCULAR | Status: DC
  Filled 2020-11-09: qty 20

## 2020-11-09 NOTE — Anesthesia Preprocedure Evaluation (Addendum)
Anesthesia Evaluation  Patient identified by MRN, date of birth, ID band Patient awake    Reviewed: Allergy & Precautions, NPO status , Patient's Chart, lab work & pertinent test results  Airway Mallampati: II  TM Distance: >3 FB Neck ROM: Full    Dental no notable dental hx. (+) Teeth Intact, Dental Advisory Given   Pulmonary neg pulmonary ROS, sleep apnea ,    Pulmonary exam normal breath sounds clear to auscultation       Cardiovascular Exercise Tolerance: Good hypertension, Pt. on medications Normal cardiovascular exam Rhythm:Regular Rate:Normal  8/20 Echo The left ventricle has normal systolic function with an ejection fraction of 60-65%. The cavity size was normal. There is mildly increased left ventricular wall thickness. Left ventricular diastolic Doppler parameters are consistent with impaired relaxation  11/01/20 EKG SR R86 w NSST   Neuro/Psych Seizures -,  Epilepsy w absance sz 3 x a month staring    GI/Hepatic Neg liver ROS, Colon CA   Endo/Other  negative endocrine ROS  Renal/GU negative Renal ROS     Musculoskeletal negative musculoskeletal ROS (+)   Abdominal (+) + obese,   Peds  Hematology negative hematology ROS (+) Hgb 13.3 Plt 270 T&S available   Anesthesia Other Findings All: PCN, Stroptomycin  Reproductive/Obstetrics                           Anesthesia Physical Anesthesia Plan  ASA: III  Anesthesia Plan: General   Post-op Pain Management:    Induction: Intravenous  PONV Risk Score and Plan: 3 and Treatment may vary due to age or medical condition, Midazolam and Ondansetron  Airway Management Planned: Oral ETT  Additional Equipment: None  Intra-op Plan:   Post-operative Plan: Extubation in OR  Informed Consent: I have reviewed the patients History and Physical, chart, labs and discussed the procedure including the risks, benefits and alternatives for  the proposed anesthesia with the patient or authorized representative who has indicated his/her understanding and acceptance.     Dental advisory given  Plan Discussed with: CRNA and Anesthesiologist  Anesthesia Plan Comments: (GA w Lido infusion + Ketamine)       Anesthesia Quick Evaluation

## 2020-11-10 ENCOUNTER — Other Ambulatory Visit: Payer: Self-pay

## 2020-11-10 ENCOUNTER — Encounter (HOSPITAL_COMMUNITY): Payer: Self-pay | Admitting: General Surgery

## 2020-11-10 ENCOUNTER — Inpatient Hospital Stay (HOSPITAL_COMMUNITY): Payer: BLUE CROSS/BLUE SHIELD | Admitting: Anesthesiology

## 2020-11-10 ENCOUNTER — Encounter (HOSPITAL_COMMUNITY)
Admission: RE | Disposition: A | Discharge status: Home / Self Care | Payer: Self-pay | Source: Home / Self Care | Attending: General Surgery

## 2020-11-10 ENCOUNTER — Inpatient Hospital Stay (HOSPITAL_COMMUNITY)
Admission: RE | Admit: 2020-11-10 | Discharge: 2020-11-16 | DRG: 330 | Disposition: A | Discharge status: Home / Self Care | Payer: BLUE CROSS/BLUE SHIELD | Attending: General Surgery | Admitting: General Surgery

## 2020-11-10 DIAGNOSIS — G4730 Sleep apnea, unspecified: Secondary | ICD-10-CM | POA: Diagnosis present

## 2020-11-10 DIAGNOSIS — I10 Essential (primary) hypertension: Secondary | ICD-10-CM | POA: Diagnosis present

## 2020-11-10 DIAGNOSIS — Z882 Allergy status to sulfonamides status: Secondary | ICD-10-CM

## 2020-11-10 DIAGNOSIS — Z880 Allergy status to penicillin: Secondary | ICD-10-CM

## 2020-11-10 DIAGNOSIS — Z20822 Contact with and (suspected) exposure to covid-19: Secondary | ICD-10-CM | POA: Diagnosis present

## 2020-11-10 DIAGNOSIS — K660 Peritoneal adhesions (postprocedural) (postinfection): Secondary | ICD-10-CM | POA: Diagnosis present

## 2020-11-10 DIAGNOSIS — Z79899 Other long term (current) drug therapy: Secondary | ICD-10-CM

## 2020-11-10 DIAGNOSIS — Z888 Allergy status to other drugs, medicaments and biological substances status: Secondary | ICD-10-CM

## 2020-11-10 DIAGNOSIS — G40802 Other epilepsy, not intractable, without status epilepticus: Secondary | ICD-10-CM | POA: Diagnosis present

## 2020-11-10 DIAGNOSIS — R339 Retention of urine, unspecified: Secondary | ICD-10-CM | POA: Diagnosis not present

## 2020-11-10 DIAGNOSIS — Z5331 Laparoscopic surgical procedure converted to open procedure: Secondary | ICD-10-CM | POA: Diagnosis not present

## 2020-11-10 DIAGNOSIS — Z803 Family history of malignant neoplasm of breast: Secondary | ICD-10-CM

## 2020-11-10 DIAGNOSIS — Z8261 Family history of arthritis: Secondary | ICD-10-CM

## 2020-11-10 DIAGNOSIS — C182 Malignant neoplasm of ascending colon: Secondary | ICD-10-CM | POA: Diagnosis present

## 2020-11-10 HISTORY — PX: LAPAROSCOPIC PARTIAL COLECTOMY: SHX5907

## 2020-11-10 LAB — TYPE AND SCREEN
ABO/RH(D): O POS
Antibody Screen: NEGATIVE

## 2020-11-10 LAB — ABO/RH: ABO/RH(D): O POS

## 2020-11-10 SURGERY — LAPAROSCOPIC PARTIAL COLECTOMY
Anesthesia: General | Site: Abdomen

## 2020-11-10 MED ORDER — ORAL CARE MOUTH RINSE: 15.0000 mL | Freq: Once | OROMUCOSAL | Status: AC

## 2020-11-10 MED ORDER — LACTATED RINGERS IR SOLN
Status: DC | PRN
  Administered 2020-11-10: 1000 mL

## 2020-11-10 MED ORDER — SUGAMMADEX SODIUM 200 MG/2ML IV SOLN
INTRAVENOUS | Status: DC | PRN
  Administered 2020-11-10: 200 mg via INTRAVENOUS

## 2020-11-10 MED ORDER — GABAPENTIN 300 MG PO CAPS
300.0000 mg | ORAL_CAPSULE | ORAL | Status: AC
  Administered 2020-11-10: 300 mg via ORAL
  Filled 2020-11-10: qty 1

## 2020-11-10 MED ORDER — PROPOFOL 10 MG/ML IV BOLUS
INTRAVENOUS | Status: DC | PRN
  Administered 2020-11-10: 140 mg via INTRAVENOUS

## 2020-11-10 MED ORDER — MEPERIDINE HCL 50 MG/ML IJ SOLN: 6.2500 mg | INTRAMUSCULAR | Status: DC | PRN

## 2020-11-10 MED ORDER — LAMOTRIGINE 100 MG PO TABS: 300.0000 mg | ORAL_TABLET | Freq: Every day | ORAL | Status: DC

## 2020-11-10 MED ORDER — ONDANSETRON HCL 4 MG/2ML IJ SOLN
INTRAMUSCULAR | Status: AC
  Filled 2020-11-10: qty 2

## 2020-11-10 MED ORDER — LAMOTRIGINE 25 MG PO TABS
150.0000 mg | ORAL_TABLET | Freq: Two times a day (BID) | ORAL | Status: DC
  Filled 2020-11-10: qty 2

## 2020-11-10 MED ORDER — FENTANYL CITRATE (PF) 100 MCG/2ML IJ SOLN
INTRAMUSCULAR | Status: DC | PRN
  Administered 2020-11-10: 50 ug via INTRAVENOUS
  Administered 2020-11-10: 100 ug via INTRAVENOUS
  Administered 2020-11-10 (×2): 50 ug via INTRAVENOUS

## 2020-11-10 MED ORDER — ONDANSETRON HCL 4 MG/2ML IJ SOLN
INTRAMUSCULAR | Status: DC | PRN
  Administered 2020-11-10: 4 mg via INTRAVENOUS

## 2020-11-10 MED ORDER — SIMETHICONE 80 MG PO CHEW
40.0000 mg | CHEWABLE_TABLET | Freq: Four times a day (QID) | ORAL | Status: DC | PRN
  Administered 2020-11-10: 40 mg via ORAL
  Filled 2020-11-10: qty 1

## 2020-11-10 MED ORDER — BUPIVACAINE LIPOSOME 1.3 % IJ SUSP
INTRAMUSCULAR | Status: DC | PRN
  Administered 2020-11-10: 20 mL

## 2020-11-10 MED ORDER — KETAMINE HCL 10 MG/ML IJ SOLN
INTRAMUSCULAR | Status: DC | PRN
  Administered 2020-11-10: 35 mg via INTRAVENOUS

## 2020-11-10 MED ORDER — PROPOFOL 10 MG/ML IV BOLUS
INTRAVENOUS | Status: AC
  Filled 2020-11-10: qty 20

## 2020-11-10 MED ORDER — LIDOCAINE HCL (PF) 2 % IJ SOLN
INTRAMUSCULAR | Status: AC
  Filled 2020-11-10: qty 5

## 2020-11-10 MED ORDER — DIPHENHYDRAMINE HCL 50 MG/ML IJ SOLN: 12.5000 mg | Freq: Four times a day (QID) | INTRAMUSCULAR | Status: DC | PRN

## 2020-11-10 MED ORDER — LAMOTRIGINE 100 MG PO TABS
100.0000 mg | ORAL_TABLET | Freq: Two times a day (BID) | ORAL | Status: DC
  Administered 2020-11-10 – 2020-11-15 (×10): 100 mg
  Filled 2020-11-10 (×11): qty 1

## 2020-11-10 MED ORDER — EPHEDRINE SULFATE-NACL 50-0.9 MG/10ML-% IV SOSY
PREFILLED_SYRINGE | INTRAVENOUS | Status: DC | PRN
  Administered 2020-11-10: 10 mg via INTRAVENOUS

## 2020-11-10 MED ORDER — ALVIMOPAN 12 MG PO CAPS
12.0000 mg | ORAL_CAPSULE | ORAL | Status: AC
  Administered 2020-11-10: 12 mg via ORAL
  Filled 2020-11-10: qty 1

## 2020-11-10 MED ORDER — AMISULPRIDE (ANTIEMETIC) 5 MG/2ML IV SOLN: 10.0000 mg | Freq: Once | INTRAVENOUS | Status: DC | PRN

## 2020-11-10 MED ORDER — ALUM & MAG HYDROXIDE-SIMETH 200-200-20 MG/5ML PO SUSP: 30.0000 mL | Freq: Four times a day (QID) | ORAL | Status: DC | PRN

## 2020-11-10 MED ORDER — ENSURE PRE-SURGERY PO LIQD: 296.0000 mL | Freq: Once | ORAL | Status: DC

## 2020-11-10 MED ORDER — MIDAZOLAM HCL 5 MG/5ML IJ SOLN
INTRAMUSCULAR | Status: DC | PRN
  Administered 2020-11-10: 2 mg via INTRAVENOUS

## 2020-11-10 MED ORDER — ALBUMIN HUMAN 5 % IV SOLN
INTRAVENOUS | Status: AC
  Filled 2020-11-10: qty 250

## 2020-11-10 MED ORDER — OXYCODONE HCL 5 MG/5ML PO SOLN: 5.0000 mg | Freq: Once | ORAL | Status: DC | PRN

## 2020-11-10 MED ORDER — ALBUMIN HUMAN 5 % IV SOLN: INTRAVENOUS | Status: DC | PRN

## 2020-11-10 MED ORDER — DIPHENHYDRAMINE HCL 12.5 MG/5ML PO ELIX: 12.5000 mg | ORAL_SOLUTION | Freq: Four times a day (QID) | ORAL | Status: DC | PRN

## 2020-11-10 MED ORDER — SACCHAROMYCES BOULARDII 250 MG PO CAPS
250.0000 mg | ORAL_CAPSULE | Freq: Two times a day (BID) | ORAL | Status: DC
  Administered 2020-11-11 – 2020-11-16 (×10): 250 mg via ORAL
  Filled 2020-11-10 (×12): qty 1

## 2020-11-10 MED ORDER — HYDROMORPHONE HCL 1 MG/ML IJ SOLN
0.5000 mg | INTRAMUSCULAR | Status: DC | PRN
  Administered 2020-11-10: 1 mg via INTRAVENOUS
  Administered 2020-11-11: 0.5 mg via INTRAVENOUS
  Administered 2020-11-11: 1 mg via INTRAVENOUS
  Filled 2020-11-10 (×3): qty 1

## 2020-11-10 MED ORDER — PHENYLEPHRINE 40 MCG/ML (10ML) SYRINGE FOR IV PUSH (FOR BLOOD PRESSURE SUPPORT)
PREFILLED_SYRINGE | INTRAVENOUS | Status: DC | PRN
  Administered 2020-11-10 (×4): 80 ug via INTRAVENOUS

## 2020-11-10 MED ORDER — LIDOCAINE 20MG/ML (2%) 15 ML SYRINGE OPTIME: INTRAMUSCULAR | Status: DC | PRN

## 2020-11-10 MED ORDER — BUPIVACAINE-EPINEPHRINE (PF) 0.25% -1:200000 IJ SOLN
INTRAMUSCULAR | Status: AC
  Filled 2020-11-10: qty 30

## 2020-11-10 MED ORDER — GABAPENTIN 300 MG PO CAPS
300.0000 mg | ORAL_CAPSULE | Freq: Three times a day (TID) | ORAL | Status: DC
  Administered 2020-11-11 – 2020-11-16 (×14): 300 mg via ORAL
  Filled 2020-11-10 (×16): qty 1

## 2020-11-10 MED ORDER — ACETAMINOPHEN 500 MG PO TABS
1000.0000 mg | ORAL_TABLET | ORAL | Status: AC
  Administered 2020-11-10: 1000 mg via ORAL
  Filled 2020-11-10: qty 2

## 2020-11-10 MED ORDER — ALVIMOPAN 12 MG PO CAPS
12.0000 mg | ORAL_CAPSULE | Freq: Two times a day (BID) | ORAL | Status: DC
  Administered 2020-11-11 – 2020-11-15 (×9): 12 mg via ORAL
  Filled 2020-11-10 (×9): qty 1

## 2020-11-10 MED ORDER — LIDOCAINE 20MG/ML (2%) 15 ML SYRINGE OPTIME
INTRAMUSCULAR | Status: DC | PRN
  Administered 2020-11-10: 1.5 mg/kg/h via INTRAVENOUS

## 2020-11-10 MED ORDER — ENSURE PRE-SURGERY PO LIQD: 592.0000 mL | Freq: Once | ORAL | Status: DC

## 2020-11-10 MED ORDER — LAMOTRIGINE ER 200 MG PO TB24: 200.0000 mg | ORAL_TABLET | Freq: Every day | ORAL | Status: DC

## 2020-11-10 MED ORDER — MIDAZOLAM HCL 2 MG/2ML IJ SOLN
INTRAMUSCULAR | Status: AC
  Filled 2020-11-10: qty 2

## 2020-11-10 MED ORDER — 0.9 % SODIUM CHLORIDE (POUR BTL) OPTIME
TOPICAL | Status: DC | PRN
  Administered 2020-11-10 (×2): 1000 mL
  Administered 2020-11-10: 2000 mL

## 2020-11-10 MED ORDER — KCL IN DEXTROSE-NACL 20-5-0.45 MEQ/L-%-% IV SOLN
INTRAVENOUS | Status: DC
  Filled 2020-11-10 (×11): qty 1000

## 2020-11-10 MED ORDER — ROCURONIUM BROMIDE 10 MG/ML (PF) SYRINGE
PREFILLED_SYRINGE | INTRAVENOUS | Status: AC
  Filled 2020-11-10: qty 10

## 2020-11-10 MED ORDER — LACTATED RINGERS IV SOLN: INTRAVENOUS | Status: DC

## 2020-11-10 MED ORDER — ENOXAPARIN SODIUM 40 MG/0.4ML ~~LOC~~ SOLN
40.0000 mg | SUBCUTANEOUS | Status: DC
  Administered 2020-11-11 – 2020-11-15 (×5): 40 mg via SUBCUTANEOUS
  Filled 2020-11-10 (×6): qty 0.4

## 2020-11-10 MED ORDER — LEVETIRACETAM 100 MG/ML PO SOLN
500.0000 mg | Freq: Two times a day (BID) | ORAL | Status: AC
  Administered 2020-11-10 – 2020-11-15 (×11): 500 mg
  Filled 2020-11-10 (×13): qty 5

## 2020-11-10 MED ORDER — SODIUM CHLORIDE 0.9 % IV SOLN
2.0000 g | INTRAVENOUS | Status: AC
  Administered 2020-11-10: 2 g via INTRAVENOUS
  Filled 2020-11-10: qty 2

## 2020-11-10 MED ORDER — ONDANSETRON HCL 4 MG/2ML IJ SOLN: 4.0000 mg | Freq: Once | INTRAMUSCULAR | Status: DC | PRN

## 2020-11-10 MED ORDER — HYDROMORPHONE HCL 1 MG/ML IJ SOLN: 0.2500 mg | INTRAMUSCULAR | Status: DC | PRN

## 2020-11-10 MED ORDER — METHOCARBAMOL 500 MG PO TABS
500.0000 mg | ORAL_TABLET | Freq: Two times a day (BID) | ORAL | Status: DC
  Administered 2020-11-11 – 2020-11-16 (×11): 500 mg via ORAL
  Filled 2020-11-10 (×13): qty 1

## 2020-11-10 MED ORDER — LIDOCAINE 2% (20 MG/ML) 5 ML SYRINGE
INTRAMUSCULAR | Status: DC | PRN
  Administered 2020-11-10: 60 mg via INTRAVENOUS

## 2020-11-10 MED ORDER — ONDANSETRON HCL 4 MG PO TABS: 4.0000 mg | ORAL_TABLET | Freq: Four times a day (QID) | ORAL | Status: DC | PRN

## 2020-11-10 MED ORDER — ROCURONIUM BROMIDE 10 MG/ML (PF) SYRINGE
PREFILLED_SYRINGE | INTRAVENOUS | Status: DC | PRN
  Administered 2020-11-10 (×3): 10 mg via INTRAVENOUS
  Administered 2020-11-10: 60 mg via INTRAVENOUS

## 2020-11-10 MED ORDER — DEXAMETHASONE SODIUM PHOSPHATE 10 MG/ML IJ SOLN
INTRAMUSCULAR | Status: AC
  Filled 2020-11-10: qty 1

## 2020-11-10 MED ORDER — ALBUMIN HUMAN 5 % IV SOLN
12.5000 g | Freq: Once | INTRAVENOUS | Status: AC
  Administered 2020-11-10: 12.5 g via INTRAVENOUS

## 2020-11-10 MED ORDER — CHLORHEXIDINE GLUCONATE 0.12 % MT SOLN
15.0000 mL | Freq: Once | OROMUCOSAL | Status: AC
  Administered 2020-11-10: 15 mL via OROMUCOSAL

## 2020-11-10 MED ORDER — BUPIVACAINE-EPINEPHRINE 0.25% -1:200000 IJ SOLN
INTRAMUSCULAR | Status: DC | PRN
  Administered 2020-11-10: 30 mL

## 2020-11-10 MED ORDER — ONDANSETRON HCL 4 MG/2ML IJ SOLN: 4.0000 mg | Freq: Four times a day (QID) | INTRAMUSCULAR | Status: DC | PRN

## 2020-11-10 MED ORDER — LEVETIRACETAM ER 500 MG PO TB24
1000.0000 mg | ORAL_TABLET | Freq: Every day | ORAL | Status: DC
  Filled 2020-11-10: qty 2

## 2020-11-10 MED ORDER — FENTANYL CITRATE (PF) 250 MCG/5ML IJ SOLN
INTRAMUSCULAR | Status: AC
  Filled 2020-11-10: qty 5

## 2020-11-10 MED ORDER — KETOROLAC TROMETHAMINE 30 MG/ML IJ SOLN: 30.0000 mg | Freq: Once | INTRAMUSCULAR | Status: DC | PRN

## 2020-11-10 MED ORDER — OXYCODONE HCL 5 MG PO TABS: 5.0000 mg | ORAL_TABLET | Freq: Once | ORAL | Status: DC | PRN

## 2020-11-10 MED ORDER — DEXAMETHASONE SODIUM PHOSPHATE 10 MG/ML IJ SOLN
INTRAMUSCULAR | Status: DC | PRN
  Administered 2020-11-10: 10 mg via INTRAVENOUS

## 2020-11-10 SURGICAL SUPPLY — 64 items
APPLIER CLIP 5 13 M/L LIGAMAX5 (MISCELLANEOUS)
BLADE EXTENDED COATED 6.5IN (ELECTRODE) IMPLANT
CABLE HIGH FREQUENCY MONO STRZ (ELECTRODE) IMPLANT
CELLS DAT CNTRL 66122 CELL SVR (MISCELLANEOUS) IMPLANT
CHLORAPREP W/TINT 26 (MISCELLANEOUS) ×2 IMPLANT
CLIP APPLIE 5 13 M/L LIGAMAX5 (MISCELLANEOUS) IMPLANT
COVER WAND RF STERILE (DRAPES) ×2 IMPLANT
DECANTER SPIKE VIAL GLASS SM (MISCELLANEOUS) ×2 IMPLANT
DERMABOND ADVANCED (GAUZE/BANDAGES/DRESSINGS) ×1
DERMABOND ADVANCED .7 DNX12 (GAUZE/BANDAGES/DRESSINGS) ×1 IMPLANT
DRAIN CHANNEL 19F RND (DRAIN) IMPLANT
DRAPE LAPAROSCOPIC ABDOMINAL (DRAPES) ×2 IMPLANT
DRSG OPSITE POSTOP 4X10 (GAUZE/BANDAGES/DRESSINGS) ×2 IMPLANT
DRSG OPSITE POSTOP 4X6 (GAUZE/BANDAGES/DRESSINGS) IMPLANT
DRSG OPSITE POSTOP 4X8 (GAUZE/BANDAGES/DRESSINGS) IMPLANT
ELECT PENCIL ROCKER SW 15FT (MISCELLANEOUS) ×2 IMPLANT
ELECT REM PT RETURN 15FT ADLT (MISCELLANEOUS) ×2 IMPLANT
EVACUATOR SILICONE 100CC (DRAIN) IMPLANT
GAUZE SPONGE 4X4 12PLY STRL (GAUZE/BANDAGES/DRESSINGS) IMPLANT
GLOVE SURG ENC MOIS LTX SZ6.5 (GLOVE) ×4 IMPLANT
GLOVE SURG UNDER POLY LF SZ7 (GLOVE) ×4 IMPLANT
GOWN STRL REUS W/TWL XL LVL3 (GOWN DISPOSABLE) ×12 IMPLANT
GRASPER ENDOPATH ANVIL 10MM (MISCELLANEOUS) IMPLANT
HOLDER FOLEY CATH W/STRAP (MISCELLANEOUS) ×2 IMPLANT
IRRIG SUCT STRYKERFLOW 2 WTIP (MISCELLANEOUS) ×2
IRRIGATION SUCT STRKRFLW 2 WTP (MISCELLANEOUS) ×1 IMPLANT
KIT TURNOVER KIT A (KITS) ×2 IMPLANT
PACK COLON (CUSTOM PROCEDURE TRAY) ×2 IMPLANT
PAD POSITIONING PINK XL (MISCELLANEOUS) ×2 IMPLANT
PORT LAP GEL ALEXIS MED 5-9CM (MISCELLANEOUS) ×2 IMPLANT
PROTECTOR NERVE ULNAR (MISCELLANEOUS) ×2 IMPLANT
RELOAD PROXIMATE 75MM BLUE (ENDOMECHANICALS) ×4 IMPLANT
RETRACTOR WND ALEXIS 25 LRG (MISCELLANEOUS) ×1 IMPLANT
RTRCTR WOUND ALEXIS 18CM MED (MISCELLANEOUS)
RTRCTR WOUND ALEXIS 25CM LRG (MISCELLANEOUS) ×2
SCISSORS LAP 5X35 DISP (ENDOMECHANICALS) ×2 IMPLANT
SEALER TISSUE G2 STRG ARTC 35C (ENDOMECHANICALS) ×2 IMPLANT
SET TUBE SMOKE EVAC HIGH FLOW (TUBING) ×2 IMPLANT
SLEEVE XCEL OPT CAN 5 100 (ENDOMECHANICALS) ×4 IMPLANT
SPONGE DRAIN TRACH 4X4 STRL 2S (GAUZE/BANDAGES/DRESSINGS) IMPLANT
SPONGE LAP 18X18 RF (DISPOSABLE) ×8 IMPLANT
STAPLER GUN LINEAR PROX 60 (STAPLE) ×2 IMPLANT
STAPLER PROXIMATE 75MM BLUE (STAPLE) ×2 IMPLANT
SURGILUBE 2OZ TUBE FLIPTOP (MISCELLANEOUS) ×2 IMPLANT
SUT ETHILON 2 0 PS N (SUTURE) IMPLANT
SUT NOVA NAB DX-16 0-1 5-0 T12 (SUTURE) ×2 IMPLANT
SUT PDS AB 1 CT1 27 (SUTURE) ×4 IMPLANT
SUT PROLENE 2 0 KS (SUTURE) IMPLANT
SUT SILK 2 0 (SUTURE) ×1
SUT SILK 2 0 SH CR/8 (SUTURE) ×2 IMPLANT
SUT SILK 2-0 18XBRD TIE 12 (SUTURE) ×1 IMPLANT
SUT SILK 3 0 (SUTURE) ×1
SUT SILK 3 0 SH CR/8 (SUTURE) ×4 IMPLANT
SUT SILK 3-0 18XBRD TIE 12 (SUTURE) ×1 IMPLANT
SUT VIC AB 2-0 SH 18 (SUTURE) IMPLANT
SUT VIC AB 4-0 PS2 27 (SUTURE) ×6 IMPLANT
SUT VICRYL 0 UR6 27IN ABS (SUTURE) ×2 IMPLANT
SYS LAPSCP GELPORT 120MM (MISCELLANEOUS)
SYSTEM LAPSCP GELPORT 120MM (MISCELLANEOUS) IMPLANT
TOWEL OR NON WOVEN STRL DISP B (DISPOSABLE) ×2 IMPLANT
TRAY FOLEY MTR SLVR 16FR STAT (SET/KITS/TRAYS/PACK) ×2 IMPLANT
TROCAR BLADELESS OPT 5 100 (ENDOMECHANICALS) ×2 IMPLANT
TROCAR XCEL BLUNT TIP 100MML (ENDOMECHANICALS) ×2 IMPLANT
TUBING CONNECTING 10 (TUBING) ×4 IMPLANT

## 2020-11-10 NOTE — Anesthesia Postprocedure Evaluation (Signed)
Anesthesia Post Note  Patient: Carlos Miranda  Procedure(s) Performed: LAPAROSCOPIC CONVERTED TO OPEN RIGHT COLECTOMY, LAPAROCOPIC LYSIS OF ADHESIONS (N/A Abdomen)     Patient location during evaluation: PACU Anesthesia Type: General Level of consciousness: awake and alert Pain management: pain level controlled Vital Signs Assessment: post-procedure vital signs reviewed and stable Respiratory status: spontaneous breathing, nonlabored ventilation, respiratory function stable and patient connected to nasal cannula oxygen Cardiovascular status: blood pressure returned to baseline and stable Postop Assessment: no apparent nausea or vomiting Anesthetic complications: no   No complications documented.  Last Vitals:  Vitals:   11/10/20 1745 11/10/20 1800  BP: 115/75 116/76  Pulse: 91 89  Resp: 16 15  Temp:    SpO2: 98% 98%    Last Pain:  Vitals:   11/10/20 1800  TempSrc:   PainSc: [place]

## 2020-11-10 NOTE — Anesthesia Procedure Notes (Signed)
Procedure Name: Intubation Date/Time: 11/10/2020 2:08 PM Performed by: Brittiny Levitz D, CRNA Pre-anesthesia Checklist: Patient identified, Emergency Drugs available, Suction available and Patient being monitored Patient Re-evaluated:Patient Re-evaluated prior to induction Oxygen Delivery Method: Circle system utilized Preoxygenation: Pre-oxygenation with 100% oxygen Induction Type: IV induction Ventilation: Mask ventilation without difficulty Laryngoscope Size: Mac and 4 Grade View: Grade I Tube type: Oral Number of attempts: 1 Airway Equipment and Method: Stylet Placement Confirmation: ETT inserted through vocal cords under direct vision,  positive ETCO2 and breath sounds checked- equal and bilateral Secured at: 7.5 cm Tube secured with: Tape Dental Injury: Teeth and Oropharynx as per pre-operative assessment

## 2020-11-10 NOTE — Transfer of Care (Signed)
Immediate Anesthesia Transfer of Care Note  Patient: Carlos Miranda  Procedure(s) Performed: LAPAROSCOPIC CONVERTED TO OPEN RIGHT COLECTOMY, LAPAROCOPIC LYSIS OF ADHESIONS (N/A Abdomen)  Patient Location: PACU  Anesthesia Type:General  Level of Consciousness: awake, alert  and oriented  Airway & Oxygen Therapy: Patient Spontanous Breathing and Patient connected to face mask oxygen  Post-op Assessment: Report given to RN and Post -op Vital signs reviewed and stable  Post vital signs: Reviewed and stable  Last Vitals:  Vitals Value Taken Time  BP 123/80 11/10/20 1702  Temp    Pulse 88 11/10/20 1705  Resp 15 11/10/20 1705  SpO2 98 % 11/10/20 1705  Vitals shown include unvalidated device data.  Last Pain:  Vitals:   11/10/20 1141  TempSrc: Oral         Complications: No complications documented.

## 2020-11-10 NOTE — Op Note (Signed)
11/10/2020  4:44 PM  PATIENT:  Carlos Miranda  60 y.o. male  Patient Care Team: Dettinger, Fransisca Kaufmann, MD as PCP - General (Family Medicine) Cameron Sprang, MD as Consulting Physician (Neurology)  PRE-OPERATIVE DIAGNOSIS:  COLON CANCER  POST-OPERATIVE DIAGNOSIS:  COLON CANCER  PROCEDURE:  LAPAROSCOPIC PARTIAL COLECTOMY, CONVERTED TO OPEN LYSIS OF ADHESIONS   Surgeon(s): Leighton Ruff, MD Carlena Hurl, PA-C  ASSISTANT: Carlena Hurl, PA   ANESTHESIA:   local and general  EBL: 137ml  Total I/O In: 1500 [I.V.:1000; IV Piggyback:500] Out: 125 [Urine:50; Blood:75]  Delay start of Pharmacological VTE agent (>24hrs) due to surgical blood loss or risk of bleeding:  no  DRAINS: none   SPECIMEN:  Source of Specimen:  R colon  DISPOSITION OF SPECIMEN:  PATHOLOGY  COUNTS:  YES  PLAN OF CARE: Admit to inpatient   PATIENT DISPOSITION:  PACU - hemodynamically stable.  INDICATION:    60 y.o. M with ascending colon cancer.  I recommended segmental resection:  The anatomy & physiology of the digestive tract was discussed.  The pathophysiology was discussed.  Natural history risks without surgery was discussed.   I worked to give an overview of the disease and the frequent need to have multispecialty involvement.  I feel the risks of no intervention will lead to serious problems that outweigh the operative risks; therefore, I recommended a partial colectomy to remove the pathology.  Laparoscopic & open techniques were discussed.   Risks such as bleeding, infection, abscess, leak, reoperation, possible ostomy, hernia, heart attack, death, and other risks were discussed.  I noted a good likelihood this will help address the problem.   Goals of post-operative recovery were discussed as well.    The patient expressed understanding & wished to proceed with surgery.  OR FINDINGS:   Patient had mass in proximal ascending colon  No obvious metastatic disease on visceral parietal peritoneum  or liver.   DESCRIPTION:  The patient was identified & brought into the operating room. The patient was positioned supine with arms tucked. SCDs were active during the entire case. The patient underwent general anesthesia without any difficulty. A foley catheter was inserted under sterile conditions. The abdomen was prepped and draped in a sterile fashion. A Surgical Timeout confirmed our plan.  I made an incision around the umbilical fold. I dissected down through the subcutaneous tissues using cautery.  The fascia was divided with cautery.  Blunt dissection was used to obtain peritoneal entry.  There were dense adhesions to the abdominal wall containing omentum and small bowel.  These were taken down using blunt dissection and sharp Metzenbaum scissors.  Once I was able to clear all the way around an incision, the alexis wound protector was placed and the cap was placed over this.  The abdomen was insufflated to ~15 mmHg.  There were adhesions throughout the abdomen.  They were mostly centered in the right upper and right lower quadrants.  I was able to clear enough of the left lower quadrant to place a 5 mm trocar.  Camera inspection revealed no injury.  I then took down enough adhesions to place another 5 mm trocar in the left upper quadrant.  I was able to use these ports to begin to clear the adhesions to the midline wound.  Over the next 30 to 40 minutes I carefully dissected the small bowel from the abdominal wall and divided this with scissors.  Once I was able to take down all the adhesions to  the abdominal wall, I evaluated the entire abdomen laparoscopically.  The liver appeared to be without metastatic disease.  There were dense adhesions to the abdominal wall at the level of the colon tumor.  These were taken down using the laparoscopic Enseal device.  I took down a layer of peritoneum with the colon attached.  This freed the colon for retraction.  I then identified the appendix and began to  mobilize off of the pelvic sidewall.  After this I began to mobilize laterally down the white line of [place] and then took down the hepatic flexure using the Enseal device. I was unable to mobilize the omentum off of the right transverse colon.  I could not safely identify the ileocolic artery and the duodenum.  At this point I decided to open the abdomen.  The [place] wound protector was removed.  A larger wound protector was placed after the incision was enlarged to below the umbilicus.  I then began to mobilize the terminal ileum out of the pelvis using blunt dissection and electrocautery.  I was careful not to involve any retroperitoneal structures.  The entire colon was then flipped medially and mobilized off of the rest of retroperitoneal structures until I could visualize the lateral edge of the duodenum underneath.  I gently freed the duodenal attachments.  The small bowel was inspected.  1 area appeared thin and 3, 3-0 silk sutures were placed in the serosa.  The remaining small bowel was ran from the terminal ileum to ligament of Treitz and no other areas of abnormalities were noted.  Additional adhesions were lysed during this time.  The small bowel was then placed back into the abdomen and I mobilized the transverse colon as much as possible.  The omentum was cleared away from this.  The terminal ileum was transected using a GIA blue load stapler. The remaining mesentery was divided using the Enseal device. I identified a portion of the transverse colon just distal to the hepatic flexure. This was transected using another blue load GIA stapler.  An anastomosis was created between the terminal ileum and the transverse colon. This was done using a GIA blue load stapler.  The common enterotomy channel was closed using a TA 60 blue load stapler. Hemostasis was good at the staple line.Several 3-0 silk sutures were used to imbricate the edge of the anastomosis. An anti-tension suture was placed in the crotch of  the anastomosis. This was then placed back into the abdomen. The abdomen was then irrigated with several liters of warm normal saline.  An NG tube was placed.  This was identified in the abdomen and the tip was noted in the body of the stomach.  This was secured by anesthesia.  The omentum was then brought down over the anastomosis. The [place] wound protector was removed, and we switched to clean instruments, gowns and drapes.  The fascia was then closed using 2, #1 PDS running sutures.  The subcutaneous tissue of the incision was closed using a running 2-0 Vicryl suture. The skin was then closed using 4-0 Vicryl sutures. Dermabond was placed on the port sites and a sterile dressing was placed over the abdominal incision. All counts were correct per operating room staff. The patient was then awakened from anesthesia and sent to the post anesthesia care unit in stable condition.
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
## 2020-11-11 ENCOUNTER — Other Ambulatory Visit: Payer: Self-pay

## 2020-11-11 ENCOUNTER — Encounter (HOSPITAL_COMMUNITY): Payer: Self-pay | Admitting: General Surgery

## 2020-11-11 LAB — CBC
HCT: 44.1 % (ref 39.0–52.0)
Hemoglobin: 13.9 g/dL (ref 13.0–17.0)
MCH: 27.7 pg (ref 26.0–34.0)
MCHC: 31.5 g/dL (ref 30.0–36.0)
MCV: 88 fL (ref 80.0–100.0)
Platelets: 226 10*3/uL (ref 150–400)
RBC: 5.01 MIL/uL (ref 4.22–5.81)
RDW: 18.7 % — ABNORMAL HIGH (ref 11.5–15.5)
WBC: 11.3 10*3/uL — ABNORMAL HIGH (ref 4.0–10.5)
nRBC: 0 % (ref 0.0–0.2)

## 2020-11-11 LAB — BASIC METABOLIC PANEL
Anion gap: 10 (ref 5–15)
BUN: 13 mg/dL (ref 6–20)
CO2: 20 mmol/L — ABNORMAL LOW (ref 22–32)
Calcium: 8.9 mg/dL (ref 8.9–10.3)
Chloride: 105 mmol/L (ref 98–111)
Creatinine, Ser: 1.3 mg/dL — ABNORMAL HIGH (ref 0.61–1.24)
GFR, Estimated: >60 mL/min (ref >60)
Glucose, Bld: 162 mg/dL — ABNORMAL HIGH (ref 70–99)
Potassium: 4.8 mmol/L (ref 3.5–5.1)
Sodium: 135 mmol/L (ref 135–145)

## 2020-11-11 MED ORDER — METOPROLOL TARTRATE 5 MG/5ML IV SOLN
5.0000 mg | INTRAVENOUS | Status: DC | PRN
  Administered 2020-11-11: 5 mg via INTRAVENOUS
  Filled 2020-11-11: qty 5

## 2020-11-11 MED ORDER — LISINOPRIL 10 MG PO TABS
10.0000 mg | ORAL_TABLET | Freq: Every day | ORAL | Status: DC
  Administered 2020-11-11 – 2020-11-16 (×5): 10 mg via ORAL
  Filled 2020-11-11 (×6): qty 1

## 2020-11-11 MED ORDER — CHLORHEXIDINE GLUCONATE CLOTH 2 % EX PADS
6.0000 | MEDICATED_PAD | Freq: Every day | CUTANEOUS | Status: DC
  Administered 2020-11-11 – 2020-11-13 (×3): 6 via TOPICAL

## 2020-11-11 NOTE — Plan of Care (Signed)

## 2020-11-11 NOTE — Progress Notes (Signed)
1 Day Post-Op  Subjective: Feels sore, bloated  Objective: Vital signs in last 24 hours: Temp:  [96.7 F (35.9 C)-98.8 F (37.1 C)] 98.8 F (37.1 C) (01/13 0542) Pulse Rate:  [87-110] 110 (01/13 0542) Resp:  [12-20] 19 (01/13 0542) BP: (94-125)/(75-96) 121/85 (01/13 0542) SpO2:  [89 %-99 %] 91 % (01/13 0740) Weight:  [161 kg] 103 kg (01/13 0740)   Intake/Output from previous day: 01/12 0701 - 01/13 0700 In: 3092.2 [I.V.:2242.2; NG/GT:100; IV Piggyback:750] Out: 1475 [Urine:1400; Blood:75] Intake/Output this shift: Total I/O In: -  Out: 250 [Emesis/NG output:250]   General appearance: alert and cooperative GI: normal findings: distended  Incision: no significant drainage  Lab Results:  Recent Labs    11/11/20 0559  WBC 11.3*  HGB 13.9  HCT 44.1  PLT 226   BMET Recent Labs    11/11/20 0559  NA 135  K 4.8  CL 105  CO2 20*  GLUCOSE 162*  BUN 13  CREATININE 1.30*  CALCIUM 8.9   PT/INR No results for input(s): LABPROT, INR in the last 72 hours. ABG No results for input(s): PHART, HCO3 in the last 72 hours.  Invalid input(s): PCO2, PO2  MEDS, Scheduled . alvimopan  12 mg Oral BID  . Chlorhexidine Gluconate Cloth  6 each Topical Daily  . enoxaparin (LOVENOX) injection  40 mg Subcutaneous Q24H  . gabapentin  300 mg Oral TID  . lamoTRIgine  100 mg Per Tube BID  . levETIRAcetam  500 mg Per Tube BID  . methocarbamol  500 mg Oral BID  . saccharomyces boulardii  250 mg Oral BID    Studies/Results: No results found.  Assessment: s/p Procedure(s): LAPAROSCOPIC CONVERTED TO OPEN RIGHT COLECTOMY, LAPAROCOPIC LYSIS OF ADHESIONS Patient Active Problem List   Diagnosis Date Noted  . Colon cancer, ascending ([place]) 11/10/2020  . Seizure disorder ([place]) 05/27/2020  . Syncope 06/16/2019  . Morbid obesity ([place]) 04/02/2019  . HTN (hypertension) 06/03/2015  . HLD (hyperlipidemia) 06/03/2015  . Arthropod bite 06/03/2015    Expected post   Plan: Cont NG   Critical meds converted to NG availability per pharmacy Ambulate in hall Await return of bowel function Cont IVF's   LOS: 1 day     .Rosario Adie, MD [facility], [place]    11/11/2020 8:20 AM

## 2020-11-11 NOTE — Progress Notes (Signed)
Pt unable to tolerate PO meds with NG tube in place. Georgette Dover, MD and Pharmacy notified. Orders changed.

## 2020-11-12 LAB — BASIC METABOLIC PANEL
Anion gap: 11 (ref 5–15)
BUN: 13 mg/dL (ref 6–20)
CO2: 23 mmol/L (ref 22–32)
Calcium: 8.7 mg/dL — ABNORMAL LOW (ref 8.9–10.3)
Chloride: 103 mmol/L (ref 98–111)
Creatinine, Ser: 1.08 mg/dL (ref 0.61–1.24)
GFR, Estimated: >60 mL/min (ref >60)
Glucose, Bld: 116 mg/dL — ABNORMAL HIGH (ref 70–99)
Potassium: 4.4 mmol/L (ref 3.5–5.1)
Sodium: 137 mmol/L (ref 135–145)

## 2020-11-12 LAB — CBC
HCT: 44.6 % (ref 39.0–52.0)
Hemoglobin: 13.7 g/dL (ref 13.0–17.0)
MCH: 28 pg (ref 26.0–34.0)
MCHC: 30.7 g/dL (ref 30.0–36.0)
MCV: 91.2 fL (ref 80.0–100.0)
Platelets: 206 10*3/uL (ref 150–400)
RBC: 4.89 MIL/uL (ref 4.22–5.81)
RDW: 19.4 % — ABNORMAL HIGH (ref 11.5–15.5)
WBC: 10.3 10*3/uL (ref 4.0–10.5)
nRBC: 0 % (ref 0.0–0.2)

## 2020-11-12 MED ORDER — MENTHOL 3 MG MT LOZG
1.0000 | LOZENGE | OROMUCOSAL | Status: DC | PRN
  Administered 2020-11-12: 3 mg via ORAL
  Filled 2020-11-12: qty 9

## 2020-11-12 NOTE — Progress Notes (Signed)
2 Days Post-Op  Subjective: Feels less sore, has been up to chair  Objective: Vital signs in last 24 hours: Temp:  [98.1 F (36.7 C)-98.5 F (36.9 C)] 98.2 F (36.8 C) (01/14 0509) Pulse Rate:  [104-110] 107 (01/14 0509) Resp:  [16-18] 18 (01/14 0509) BP: (113-141)/(55-99) 113/55 (01/14 0509) SpO2:  [92 %-94 %] 94 % (01/14 0542)   Intake/Output from previous day: 01/13 0701 - 01/14 0700 In: 3242.3 [P.O.:180; I.V.:2962.3; NG/GT:100] Out: 4250 [Urine:3050; Emesis/NG output:1200] Intake/Output this shift: Total I/O In: 0  Out: 400 [Emesis/NG output:400]   General appearance: alert and cooperative GI: normal findings: less distended, soft  Incision: no significant drainage  Lab Results:  Recent Labs    11/11/20 0559 11/12/20 0530  WBC 11.3* 10.3  HGB 13.9 13.7  HCT 44.1 44.6  PLT 226 206   BMET Recent Labs    11/11/20 0559 11/12/20 0530  NA 135 137  K 4.8 4.4  CL 105 103  CO2 20* 23  GLUCOSE 162* 116*  BUN 13 13  CREATININE 1.30* 1.08  CALCIUM 8.9 8.7*   PT/INR No results for input(s): LABPROT, INR in the last 72 hours. ABG No results for input(s): PHART, HCO3 in the last 72 hours.  Invalid input(s): PCO2, PO2  MEDS, Scheduled . alvimopan  12 mg Oral BID  . Chlorhexidine Gluconate Cloth  6 each Topical Daily  . enoxaparin (LOVENOX) injection  40 mg Subcutaneous Q24H  . gabapentin  300 mg Oral TID  . lamoTRIgine  100 mg Per Tube BID  . levETIRAcetam  500 mg Per Tube BID  . lisinopril  10 mg Oral Daily  . methocarbamol  500 mg Oral BID  . saccharomyces boulardii  250 mg Oral BID    Studies/Results: No results found.  Assessment: s/p Procedure(s): LAPAROSCOPIC CONVERTED TO OPEN RIGHT COLECTOMY, LAPAROCOPIC LYSIS OF ADHESIONS Patient Active Problem List   Diagnosis Date Noted  . Colon cancer, ascending ([place]) 11/10/2020  . Seizure disorder ([place]) 05/27/2020  . Syncope 06/16/2019  . Morbid obesity ([place]) 04/02/2019  . HTN (hypertension)  06/03/2015  . HLD (hyperlipidemia) 06/03/2015  . Arthropod bite 06/03/2015    Expected post   Plan: Cont NG  Critical meds converted to NG availability per pharmacy Ambulate in hall Await return of bowel function Cont IVF's, decrease rate   LOS: 2 days     .Rosario Adie, MD [facility], [place]    11/12/2020 10:26 AM

## 2020-11-12 NOTE — Progress Notes (Signed)
Patient has not voided since catheter was removed.  Bladder scan showed 495 ml in bladder.  There is no order for in/out catheter.  Sent message to Dr. Harlow Asa.

## 2020-11-13 LAB — CBC
HCT: 38.9 % — ABNORMAL LOW (ref 39.0–52.0)
Hemoglobin: 12.1 g/dL — ABNORMAL LOW (ref 13.0–17.0)
MCH: 27.8 pg (ref 26.0–34.0)
MCHC: 31.1 g/dL (ref 30.0–36.0)
MCV: 89.2 fL (ref 80.0–100.0)
Platelets: 226 10*3/uL (ref 150–400)
RBC: 4.36 MIL/uL (ref 4.22–5.81)
RDW: 18.9 % — ABNORMAL HIGH (ref 11.5–15.5)
WBC: 8.9 10*3/uL (ref 4.0–10.5)
nRBC: 0 % (ref 0.0–0.2)

## 2020-11-13 LAB — BASIC METABOLIC PANEL
Anion gap: 8 (ref 5–15)
BUN: 14 mg/dL (ref 6–20)
CO2: 24 mmol/L (ref 22–32)
Calcium: 8.5 mg/dL — ABNORMAL LOW (ref 8.9–10.3)
Chloride: 104 mmol/L (ref 98–111)
Creatinine, Ser: 0.84 mg/dL (ref 0.61–1.24)
GFR, Estimated: >60 mL/min (ref >60)
Glucose, Bld: 121 mg/dL — ABNORMAL HIGH (ref 70–99)
Potassium: 4.2 mmol/L (ref 3.5–5.1)
Sodium: 136 mmol/L (ref 135–145)

## 2020-11-13 MED ORDER — SODIUM CHLORIDE 0.9 % IV BOLUS
1000.0000 mL | Freq: Once | INTRAVENOUS | Status: AC
  Administered 2020-11-13: 1000 mL via INTRAVENOUS

## 2020-11-13 MED ORDER — ACETAMINOPHEN 10 MG/ML IV SOLN
1000.0000 mg | Freq: Four times a day (QID) | INTRAVENOUS | Status: AC | PRN
  Administered 2020-11-13 (×2): 1000 mg via INTRAVENOUS
  Filled 2020-11-13 (×3): qty 100

## 2020-11-13 MED ORDER — PHENOL 1.4 % MT LIQD
1.0000 | OROMUCOSAL | Status: DC | PRN
  Administered 2020-11-13: 1 via OROMUCOSAL
  Filled 2020-11-13: qty 177

## 2020-11-13 NOTE — Progress Notes (Signed)
Assessment & Plan: POD#3 s/p Procedure(s): LAPAROSCOPIC CONVERTED TO OPEN RIGHT COLECTOMY, LAPAROCOPIC LYSIS OF ADHESIONS     Patient Active Problem List   Diagnosis Date Noted  . Colon cancer, ascending ([place]) 11/10/2020  . Seizure disorder ([place]) 05/27/2020  . Syncope 06/16/2019  . Morbid obesity ([place]) 04/02/2019  . HTN (hypertension) 06/03/2015  . HLD (hyperlipidemia) 06/03/2015  . Arthropod bite 06/03/2015    Plan: Cont NG  Critical meds converted to NG availability per pharmacy Ambulate in hall Await return of bowel function Foley placed for urinary retention - will remove 1-2 days  Will give IV bolus and increase IV rate this AM.        Armandina Gemma, MD       [facility]       Office: [phone]   Chief Complaint: Colon cancer  Subjective: Patient in bed, doesn't feel well this AM.  Unable to void - Foley placed.  NG in place.  No flatus or BM yet.  Objective: Vital signs in last 24 hours: Temp:  [98.4 F (36.9 C)-99.5 F (37.5 C)] 98.4 F (36.9 C) (01/15 0628) Pulse Rate:  [104-110] 104 (01/15 0628) Resp:  [18] 18 (01/15 0628) BP: (108-117)/(63-74) 108/63 (01/15 0628) SpO2:  [92 %-97 %] 96 % (01/15 0628) Last BM Date: 11/10/20  Intake/Output from previous day: 01/14 0701 - 01/15 0700 In: 2090.9 [P.O.:60; I.V.:1970.9; NG/GT:60] Out: 2300 [Urine:1500; Emesis/NG output:800] Intake/Output this shift: No intake/output data recorded.  Physical Exam: HEENT - sclerae clear, mucous membranes moist Neck - soft Abdomen - soft, obese, mild distension; quiet; wound dry and intact Ext - no edema, non-tender Neuro - alert & oriented, no focal deficits  Lab Results:  Recent Labs    11/12/20 0530 11/13/20 0621  WBC 10.3 8.9  HGB 13.7 12.1*  HCT 44.6 38.9*  PLT 206 226   BMET Recent Labs    11/12/20 0530 11/13/20 0621  NA 137 136  K 4.4 4.2  CL 103 104  CO2 23 24  GLUCOSE 116* 121*  BUN 13 14  CREATININE 1.08 0.84   CALCIUM 8.7* 8.5*   PT/INR No results for input(s): LABPROT, INR in the last 72 hours. Comprehensive Metabolic Panel:    Component Value Date/Time   NA 136 11/13/2020 0621   NA 137 11/12/2020 0530   NA 139 03/31/2020 1534   NA 138 06/16/2019 1230   K 4.2 11/13/2020 0621   K 4.4 11/12/2020 0530   CL 104 11/13/2020 0621   CL 103 11/12/2020 0530   CO2 24 11/13/2020 0621   CO2 23 11/12/2020 0530   BUN 14 11/13/2020 0621   BUN 13 11/12/2020 0530   BUN 11 03/31/2020 1534   BUN 10 06/16/2019 1230   CREATININE 0.84 11/13/2020 0621   CREATININE 1.08 11/12/2020 0530   GLUCOSE 121 (H) 11/13/2020 0621   GLUCOSE 116 (H) 11/12/2020 0530   CALCIUM 8.5 (L) 11/13/2020 0621   CALCIUM 8.7 (L) 11/12/2020 0530   AST 17 08/29/2020 1452   AST 15 07/29/2020 1334   ALT 16 08/29/2020 1452   ALT 13 07/29/2020 1334   ALKPHOS 85 08/29/2020 1452   ALKPHOS 85 07/29/2020 1334   BILITOT 0.5 08/29/2020 1452   BILITOT 0.5 07/29/2020 1334   BILITOT 0.6 03/31/2020 1534   BILITOT 0.3 06/16/2019 1230   PROT 8.0 08/29/2020 1452   PROT 7.0 07/29/2020 1334   PROT 6.7 03/31/2020 1534   PROT 6.8 06/16/2019 1230  ALBUMIN 4.4 08/29/2020 1452   ALBUMIN 4.2 07/29/2020 1334   ALBUMIN 4.3 03/31/2020 1534   ALBUMIN 4.4 06/16/2019 1230    Studies/Results: No results found.    Armandina Gemma 11/13/2020  Patient ID: Carlos Miranda, male   DOB: 1961/07/21, 60 y.o.   MRN: [id number]

## 2020-11-14 NOTE — Progress Notes (Signed)
Assessment & Plan: POD#4 s/p Procedure(s): LAPAROSCOPIC CONVERTED TO OPEN RIGHT COLECTOMY, LAPAROCOPIC LYSIS OF ADHESIONS     Patient Active Problem List   Diagnosis Date Noted  . Colon cancer, ascending ([place]) 11/10/2020  . Seizure disorder ([place]) 05/27/2020  . Syncope 06/16/2019  . Morbid obesity ([place]) 04/02/2019  . HTN (hypertension) 06/03/2015  . HLD (hyperlipidemia) 06/03/2015  . Arthropod bite 06/03/2015    Plan: NG- clamp NG this AM and allow sips clear liquids from pantry Critical meds converted to NG availability per pharmacy Ambulate in hall with assistance Discontinue Foley - monitor voiding Continue IVF for hydration until po intake improves        Armandina Gemma, MD       [facility]       Office: [phone]   Chief Complaint: Colon cancer  Subjective: Patient in bed, has not been up much.  Denies flatus or BM.  NG with biliious.  Objective: Vital signs in last 24 hours: Temp:  [98.2 F (36.8 C)-98.5 F (36.9 C)] 98.5 F (36.9 C) (01/16 0554) Pulse Rate:  [87-103] 87 (01/16 0554) Resp:  [16-18] 16 (01/16 0554) BP: (124-134)/(74-91) 134/91 (01/16 0554) SpO2:  [92 %-94 %] 92 % (01/16 0554) Last BM Date: 11/10/20  Intake/Output from previous day: 01/15 0701 - 01/16 0700 In: 2774.9 [P.O.:120; I.V.:2434.9; NG/GT:120; IV Piggyback:100] Out: 2250 [Urine:1450; Emesis/NG output:800] Intake/Output this shift: No intake/output data recorded.  Physical Exam: HEENT - sclerae clear, mucous membranes moist Neck - soft Abdomen - soft, protuberant; non-tender; wounds dry and intact; BS present Ext - no edema, non-tender Neuro - alert & oriented, no focal deficits  Lab Results:  Recent Labs    11/12/20 0530 11/13/20 0621  WBC 10.3 8.9  HGB 13.7 12.1*  HCT 44.6 38.9*  PLT 206 226   BMET Recent Labs    11/12/20 0530 11/13/20 0621  NA 137 136  K 4.4 4.2  CL 103 104  CO2 23 24  GLUCOSE 116* 121*  BUN 13 14   CREATININE 1.08 0.84  CALCIUM 8.7* 8.5*   PT/INR No results for input(s): LABPROT, INR in the last 72 hours. Comprehensive Metabolic Panel:    Component Value Date/Time   NA 136 11/13/2020 0621   NA 137 11/12/2020 0530   NA 139 03/31/2020 1534   NA 138 06/16/2019 1230   K 4.2 11/13/2020 0621   K 4.4 11/12/2020 0530   CL 104 11/13/2020 0621   CL 103 11/12/2020 0530   CO2 24 11/13/2020 0621   CO2 23 11/12/2020 0530   BUN 14 11/13/2020 0621   BUN 13 11/12/2020 0530   BUN 11 03/31/2020 1534   BUN 10 06/16/2019 1230   CREATININE 0.84 11/13/2020 0621   CREATININE 1.08 11/12/2020 0530   GLUCOSE 121 (H) 11/13/2020 0621   GLUCOSE 116 (H) 11/12/2020 0530   CALCIUM 8.5 (L) 11/13/2020 0621   CALCIUM 8.7 (L) 11/12/2020 0530   AST 17 08/29/2020 1452   AST 15 07/29/2020 1334   ALT 16 08/29/2020 1452   ALT 13 07/29/2020 1334   ALKPHOS 85 08/29/2020 1452   ALKPHOS 85 07/29/2020 1334   BILITOT 0.5 08/29/2020 1452   BILITOT 0.5 07/29/2020 1334   BILITOT 0.6 03/31/2020 1534   BILITOT 0.3 06/16/2019 1230   PROT 8.0 08/29/2020 1452   PROT 7.0 07/29/2020 1334   PROT 6.7 03/31/2020 1534   PROT 6.8 06/16/2019 1230   ALBUMIN 4.4 08/29/2020 1452   ALBUMIN 4.2  07/29/2020 1334   ALBUMIN 4.3 03/31/2020 1534   ALBUMIN 4.4 06/16/2019 1230    Studies/Results: No results found.    Armandina Gemma 11/14/2020  Patient ID: Carlos Miranda, male   DOB: [birth year], 60 y.o.   MRN: [id number]

## 2020-11-15 MED ORDER — LAMOTRIGINE ER 200 MG PO TB24
200.0000 mg | ORAL_TABLET | Freq: Every day | ORAL | Status: DC
  Administered 2020-11-15: 200 mg via ORAL

## 2020-11-15 MED ORDER — LEVETIRACETAM ER 500 MG PO TB24
1000.0000 mg | ORAL_TABLET | Freq: Every day | ORAL | Status: DC
Start: 2020-11-15 — End: 2020-11-16
  Filled 2020-11-15: qty 2

## 2020-11-15 NOTE — Progress Notes (Signed)
5 Days Post-Op  Subjective: Min pain, NG out and having bm's.  Tolerating sips of clears.    Objective: Vital signs in last 24 hours: Temp:  [98.3 F (36.8 C)-99.1 F (37.3 C)] 98.3 F (36.8 C) (01/17 0533) Pulse Rate:  [96-105] 96 (01/17 0533) Resp:  [17-18] 17 (01/17 0533) BP: (112-122)/(74-78) 118/78 (01/17 0533) SpO2:  [95 %-96 %] 96 % (01/17 0533)   Intake/Output from previous day: 01/16 0701 - 01/17 0700 In: 4243.7 [P.O.:480; I.V.:3063.7; NG/GT:700] Out: 1300 [Urine:1300] Intake/Output this shift: No intake/output data recorded.   General appearance: alert and cooperative GI: normal findings: less distended, soft  Incision: no significant drainage  Lab Results:  Recent Labs    11/13/20 0621  WBC 8.9  HGB 12.1*  HCT 38.9*  PLT 226   BMET Recent Labs    11/13/20 0621  NA 136  K 4.2  CL 104  CO2 24  GLUCOSE 121*  BUN 14  CREATININE 0.84  CALCIUM 8.5*   PT/INR No results for input(s): LABPROT, INR in the last 72 hours. ABG No results for input(s): PHART, HCO3 in the last 72 hours.  Invalid input(s): PCO2, PO2  MEDS, Scheduled . alvimopan  12 mg Oral BID  . Chlorhexidine Gluconate Cloth  6 each Topical Daily  . enoxaparin (LOVENOX) injection  40 mg Subcutaneous Q24H  . gabapentin  300 mg Oral TID  . lamoTRIgine  100 mg Per Tube BID  . levETIRAcetam  500 mg Per Tube BID  . lisinopril  10 mg Oral Daily  . methocarbamol  500 mg Oral BID  . saccharomyces boulardii  250 mg Oral BID    Studies/Results: No results found.  Assessment: s/p Procedure(s): LAPAROSCOPIC CONVERTED TO OPEN RIGHT COLECTOMY, LAPAROCOPIC LYSIS OF ADHESIONS Patient Active Problem List   Diagnosis Date Noted  . Colon cancer, ascending ([place]) 11/10/2020  . Seizure disorder ([place]) 05/27/2020  . Syncope 06/16/2019  . Morbid obesity ([place]) 04/02/2019  . HTN (hypertension) 06/03/2015  . HLD (hyperlipidemia) 06/03/2015  . Arthropod bite 06/03/2015    Expected post    Plan: Advance diet as tolerated Ambulate in hall SL IVF's   LOS: 5 days     .Rosario Adie, MD [facility], [place]    11/15/2020 10:45 AM

## 2020-11-15 NOTE — Progress Notes (Signed)
Went in patient's room. Pts NG tube had slid out a few inches. Pt had a BM and was requesting his NG tube to be pulled since he had BM and is tolerating clear liquids. Removed NG tube per pt request.

## 2020-11-15 NOTE — Progress Notes (Signed)
Pharmacy Brief Note - Alvimopan (Entereg)  The standing order set for alvimopan (Entereg) now includes an automatic order to discontinue the drug after the patient has had a bowel movement. The change was approved by the [place] and the Medical Executive Committee.   This patient has had bowel movements documented by MD. Therefore, alvimopan has been discontinued. If there are questions, please contact the pharmacy at [phone].   Thank you-  Minda Ditto PharmD 11/15/2020, 10:50 AM

## 2020-11-16 NOTE — Discharge Summary (Signed)
Physician Discharge Summary  Patient ID: Carlos Miranda MRN: [id number] DOB/AGE: 02-01-1961 60 y.o.  Admit date: 11/10/2020 Discharge date: 11/16/2020  Admission Diagnoses: Colon cancer  Discharge Diagnoses:  Active Problems:   Colon cancer, ascending [facility])   Discharged Condition: good  Hospital Course: Patient was admitted to the hospital after colon surgery.  An NG was placed due to significant lysis of adhesions.  Over the next few days we awaited return of his bowel function.  When this occurred, the NG tube was removed and his diet was advanced.  He was discharged home on postop day 6 in stable condition.  Consults: None  Significant Diagnostic Studies: labs: cbc, bmet  Treatments: IV hydration, analgesia: acetaminophen and surgery: lap converted to open R colectomy  Discharge Exam: Blood pressure 119/72, pulse 85, temperature 98 F (36.7 C), temperature source Oral, resp. rate 18, height 5\' 8"  (1.727 m), weight 103 kg, SpO2 95 %. General appearance: alert and cooperative GI: normal findings: soft, non-tender Skin: inc: c/d/i  Disposition: Discharge disposition: 01-Home or Self Care        Allergies as of 11/16/2020      Reactions   Penicillins Swelling   Has patient had a PCN reaction causing    Furosemide Other (See Comments)   Advised to avoid this medication due to condition from childhood (Meninigitis)   Streptomycin Other (See Comments)   Avoid streptomycin, neomycin, and kanamycin due to deafness in one ear from meninigitis   Sulfa Antibiotics Other (See Comments)   Unknown reaction      Medication List    TAKE these medications   Arthritis Pain Relieving 0.075 % topical cream Generic drug: capsicum Apply 1 application topically daily as needed for pain.   ferrous sulfate 325 (65 FE) MG tablet Take 325 mg by mouth daily.   gabapentin 300 MG capsule Commonly known as: NEURONTIN Take 300 mg by mouth 3 (three) times daily.   LamoTRIgine 300 MG  Tb24 24 hour tablet Take 1 tablet every night   levETIRAcetam 500 MG 24 hr tablet Commonly known as: Keppra XR Take 2 tablets (1,000 mg total) by mouth at bedtime.   lisinopril 10 MG tablet Commonly known as: ZESTRIL Take 1 tablet (10 mg total) by mouth daily.   methocarbamol 500 MG tablet Commonly known as: ROBAXIN Take 1 tablet (500 mg total) by mouth 2 (two) times daily.   pravastatin 40 MG tablet Commonly known as: PRAVACHOL Take 1 tablet (40 mg total) by mouth daily.   pseudoephedrine 120 MG 12 hr tablet Commonly known as: SUDAFED Take 120 mg by mouth every 12 (twelve) hours as needed for congestion.       Follow-up Information    Leighton Ruff, MD. Schedule an appointment as soon as possible for a visit in 2 week(s).   Specialty: General Surgery Contact information: [address] [address] [phone]               Signed: Rosario Adie 3/50/0938, 10:40 AM

## 2020-11-16 NOTE — Discharge Instructions (Signed)

## 2020-11-17 LAB — SURGICAL PATHOLOGY

## 2020-11-18 DIAGNOSIS — Z0271 Encounter for disability determination: Secondary | ICD-10-CM

## 2020-11-24 ENCOUNTER — Other Ambulatory Visit: Payer: Self-pay

## 2020-11-24 NOTE — Progress Notes (Signed)
The proposed treatment discussed in conference is for discussion purposes only and is not a binding recommendation.  The patients have not been physically examined, or presented with their treatment options.  Therefore, final treatment plans cannot be decided.   

## 2020-12-14 ENCOUNTER — Other Ambulatory Visit: Payer: Self-pay

## 2020-12-14 ENCOUNTER — Inpatient Hospital Stay (HOSPITAL_COMMUNITY): Payer: BLUE CROSS/BLUE SHIELD | Attending: Hematology | Admitting: Hematology

## 2020-12-14 ENCOUNTER — Encounter (HOSPITAL_COMMUNITY): Payer: Self-pay

## 2020-12-14 ENCOUNTER — Inpatient Hospital Stay (HOSPITAL_COMMUNITY): Payer: BLUE CROSS/BLUE SHIELD

## 2020-12-14 VITALS — BP 136/93 | HR 94 | Temp 97.2°F | Resp 20 | Ht 68.0 in | Wt 221.8 lb

## 2020-12-14 DIAGNOSIS — Z832 Family history of diseases of the blood and blood-forming organs and certain disorders involving the immune mechanism: Secondary | ICD-10-CM | POA: Insufficient documentation

## 2020-12-14 DIAGNOSIS — R569 Unspecified convulsions: Secondary | ICD-10-CM | POA: Diagnosis not present

## 2020-12-14 DIAGNOSIS — Z882 Allergy status to sulfonamides status: Secondary | ICD-10-CM | POA: Diagnosis not present

## 2020-12-14 DIAGNOSIS — C182 Malignant neoplasm of ascending colon: Secondary | ICD-10-CM | POA: Diagnosis not present

## 2020-12-14 DIAGNOSIS — Z8249 Family history of ischemic heart disease and other diseases of the circulatory system: Secondary | ICD-10-CM | POA: Diagnosis not present

## 2020-12-14 DIAGNOSIS — Z809 Family history of malignant neoplasm, unspecified: Secondary | ICD-10-CM | POA: Diagnosis not present

## 2020-12-14 DIAGNOSIS — R599 Enlarged lymph nodes, unspecified: Secondary | ICD-10-CM

## 2020-12-14 DIAGNOSIS — Z8269 Family history of other diseases of the musculoskeletal system and connective tissue: Secondary | ICD-10-CM | POA: Insufficient documentation

## 2020-12-14 DIAGNOSIS — Z8261 Family history of arthritis: Secondary | ICD-10-CM | POA: Diagnosis not present

## 2020-12-14 DIAGNOSIS — Z880 Allergy status to penicillin: Secondary | ICD-10-CM

## 2020-12-14 DIAGNOSIS — Z888 Allergy status to other drugs, medicaments and biological substances status: Secondary | ICD-10-CM | POA: Diagnosis not present

## 2020-12-14 DIAGNOSIS — Z79899 Other long term (current) drug therapy: Secondary | ICD-10-CM | POA: Diagnosis not present

## 2020-12-14 DIAGNOSIS — R918 Other nonspecific abnormal finding of lung field: Secondary | ICD-10-CM | POA: Diagnosis not present

## 2020-12-14 DIAGNOSIS — E669 Obesity, unspecified: Secondary | ICD-10-CM | POA: Insufficient documentation

## 2020-12-14 DIAGNOSIS — I10 Essential (primary) hypertension: Secondary | ICD-10-CM | POA: Insufficient documentation

## 2020-12-14 DIAGNOSIS — K6389 Other specified diseases of intestine: Secondary | ICD-10-CM

## 2020-12-14 DIAGNOSIS — D509 Iron deficiency anemia, unspecified: Secondary | ICD-10-CM

## 2020-12-14 DIAGNOSIS — Z9049 Acquired absence of other specified parts of digestive tract: Secondary | ICD-10-CM

## 2020-12-14 DIAGNOSIS — E785 Hyperlipidemia, unspecified: Secondary | ICD-10-CM | POA: Diagnosis not present

## 2020-12-14 DIAGNOSIS — Z803 Family history of malignant neoplasm of breast: Secondary | ICD-10-CM | POA: Insufficient documentation

## 2020-12-14 LAB — CBC WITH DIFFERENTIAL/PLATELET
Abs Immature Granulocytes: 0.01 10*3/uL (ref 0.00–0.07)
Basophils Absolute: 0 10*3/uL (ref 0.0–0.1)
Basophils Relative: 1 %
Eosinophils Absolute: 0.4 10*3/uL (ref 0.0–0.5)
Eosinophils Relative: 8 %
HCT: 42.8 % (ref 39.0–52.0)
Hemoglobin: 13.1 g/dL (ref 13.0–17.0)
Immature Granulocytes: 0 %
Lymphocytes Relative: 29 %
Lymphs Abs: 1.4 10*3/uL (ref 0.7–4.0)
MCH: 27.3 pg (ref 26.0–34.0)
MCHC: 30.6 g/dL (ref 30.0–36.0)
MCV: 89.2 fL (ref 80.0–100.0)
Monocytes Absolute: 0.5 10*3/uL (ref 0.1–1.0)
Monocytes Relative: 10 %
Neutro Abs: 2.5 10*3/uL (ref 1.7–7.7)
Neutrophils Relative %: 52 %
Platelets: 304 10*3/uL (ref 150–400)
RBC: 4.8 MIL/uL (ref 4.22–5.81)
RDW: 15.2 % (ref 11.5–15.5)
WBC: 4.8 10*3/uL (ref 4.0–10.5)
nRBC: 0 % (ref 0.0–0.2)

## 2020-12-14 LAB — COMPREHENSIVE METABOLIC PANEL
ALT: 18 U/L (ref 0–44)
AST: 17 U/L (ref 15–41)
Albumin: 3.7 g/dL (ref 3.5–5.0)
Alkaline Phosphatase: 100 U/L (ref 38–126)
Anion gap: 7 (ref 5–15)
BUN: 11 mg/dL (ref 6–20)
CO2: 26 mmol/L (ref 22–32)
Calcium: 8.9 mg/dL (ref 8.9–10.3)
Chloride: 104 mmol/L (ref 98–111)
Creatinine, Ser: 1.12 mg/dL (ref 0.61–1.24)
GFR, Estimated: >60 mL/min (ref >60)
Glucose, Bld: 110 mg/dL — ABNORMAL HIGH (ref 70–99)
Potassium: 3.6 mmol/L (ref 3.5–5.1)
Sodium: 137 mmol/L (ref 135–145)
Total Bilirubin: 0.4 mg/dL (ref 0.3–1.2)
Total Protein: 6.8 g/dL (ref 6.5–8.1)

## 2020-12-14 NOTE — Progress Notes (Signed)
I met with patient and his father today during his initial visit with Dr. Delton Coombes. I introduced myself and explained my role in the patient's care. I provided my contact information and encouraged the patient to call with any questions or concerns.
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
## 2020-12-14 NOTE — Patient Instructions (Signed)
[place] at [facility] Discharge Instructions  You were seen and examined today by Dr. Delton Coombes. Dr. Delton Coombes is a medical oncologist, meaning he specializes in the management of cancer diagnoses with medications. Dr. Delton Coombes discussed your past medical history, family history of cancer, and the events that led to you being here today.  Dr. Delton Coombes reviewed your biopsy results, which revealed colon cancer with lymph node involvement, this automatically means that your cancer is Stage III. Dr. Delton Coombes has recommended a follow-up scan to view your chest, abdomen and pelvis. Dr. Delton Coombes has also recommended additional lab work to check your blood counts, electrolytes, kidney/liver function and a tumor marker specific to colon cancer.   Dr. Delton Coombes discussed treatment. Because there was cancer in the lymph nodes, your cancer is high risk for recurrence. Dr. Delton Coombes has recommended 6 months of chemotherapy. We will discuss definite treatment options following the scans.  We will see you back following the repeat CT scan.   Thank you for choosing [place] at [facility] to provide your oncology and hematology care.  To afford each patient quality time with our provider, please arrive at least 15 minutes before your scheduled appointment time.   If you have a lab appointment with the [facility] please come in thru the Main Entrance and check in at the main information desk.  You need to re-schedule your appointment should you arrive 10 or more minutes late.  We strive to give you quality time with our providers, and arriving late affects you and other patients whose appointments are after yours.  Also, if you no show three or more times for appointments you may be dismissed from the clinic at the providers discretion.     Again, thank you for choosing [facility].  Our hope is that these requests will decrease the  amount of time that you wait before being seen by our physicians.       _____________________________________________________________  Should you have questions after your visit to [facility], please contact our office at [phone] and follow the prompts.  Our office hours are 8:00 a.m. and 4:30 p.m. Monday - Friday.  Please note that voicemails left after 4:00 p.m. may not be returned until the following business day.  We are closed weekends and major holidays.  You do have access to a nurse 24-7, just call the main number to the clinic [phone] and do not press any options, hold on the line and a nurse will answer the phone.    For prescription refill requests, have your pharmacy contact our office and allow 72 hours.    Due to Covid, you will need to wear a mask upon entering the hospital. If you do not have a mask, a mask will be given to you at the Main Entrance upon arrival. For doctor visits, patients may have 1 support person age 60 or older with them. For treatment visits, patients can not have anyone with them due to social distancing guidelines and our immunocompromised population.

## 2020-12-14 NOTE — Progress Notes (Incomplete)
   CC: ***  HPI:  Mr.Shrenik Mila Palmer is a 60 y.o. male with history as below presenting for ***. Please refer to problem based charting for further details of assessment and plan of current problem and chronic medical conditions.  Cirrhosis with ascites Follows with GI, last seen on 05/14/20. Last RUQ [place] on 11/03/20 demonstrating no focal hepatic lesions, continued cirrhosis. Last labs in July 2020 with AST 46, ALT 26, T bili 1.6 alk pho 149.  -Lasix $Remov'20mg'tBsXWT$  -spironolactone $RemoveBeforeDE'50mg'UhJbrLdmAoKjZeh$  -lactulose $RemoveBef'10mg'qKVKsIyiuf$  BID -protonix $RemoveBefor'40mg'sSOwUIVquafT$  -check CBC, CMP  Duodenal ulcer, aortic ectasia ***new bleeding?  Preventive -COVID -flu  Tachycardia  ***refill meds  Past Medical History:  Diagnosis Date  . AKI (acute kidney injury) ([place]) 01/18/2018  . Cirrhosis ([place]) 2019  . Cirrhosis of liver ([place])   . Hepatic encephalopathy ([place]) 01/18/2018  . History of kidney stones    small stones seen    Review of Systems:   ROS ***  Physical Exam: There were no vitals filed for this visit. Constitutional: no acute distress Head: atraumatic ENT: external ears normal Cardiovascular: regular rate and rhythm, normal heart sounds Pulmonary: effort normal, normal breath sounds bilaterally Abdominal: flat, nontender, no rebound tenderness, bowel sounds normal Musculoskeletal: *** Skin: warm and dry Neurological: alert, no focal deficit Psychiatric: normal mood and affect  Assessment & Plan:   See Encounters Tab for problem based charting.  Patient {GC/GE:[id number]::"discussed with","seen with"} Dr. {NAMES:[id number]::"Butcher","Guilloud","Hoffman","Mullen","Narendra","Raines","Vincent"}

## 2020-12-15 LAB — CEA: CEA: 1 ng/mL (ref 0.0–4.7)

## 2020-12-21 ENCOUNTER — Encounter: Payer: Self-pay | Admitting: General Surgery

## 2020-12-21 ENCOUNTER — Encounter (HOSPITAL_COMMUNITY): Payer: Self-pay

## 2020-12-21 ENCOUNTER — Ambulatory Visit (INDEPENDENT_AMBULATORY_CARE_PROVIDER_SITE_OTHER): Payer: BLUE CROSS/BLUE SHIELD | Admitting: General Surgery

## 2020-12-21 ENCOUNTER — Other Ambulatory Visit: Payer: Self-pay

## 2020-12-21 VITALS — BP 144/86 | HR 94 | Temp 98.1°F | Resp 14 | Ht 68.0 in | Wt 224.0 lb

## 2020-12-21 DIAGNOSIS — C182 Malignant neoplasm of ascending colon: Secondary | ICD-10-CM | POA: Diagnosis not present
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
## 2020-12-21 NOTE — Patient Instructions (Signed)
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
## 2020-12-22 ENCOUNTER — Ambulatory Visit (HOSPITAL_COMMUNITY): Payer: BLUE CROSS/BLUE SHIELD

## 2020-12-24 ENCOUNTER — Encounter (HOSPITAL_COMMUNITY)
Admission: RE | Admit: 2020-12-24 | Discharge: 2020-12-24 | Disposition: A | Discharge status: Home / Self Care | Payer: BLUE CROSS/BLUE SHIELD | Source: Ambulatory Visit | Attending: General Surgery | Admitting: General Surgery

## 2020-12-24 ENCOUNTER — Other Ambulatory Visit: Payer: Self-pay

## 2020-12-24 DIAGNOSIS — Z01818 Encounter for other preprocedural examination: Secondary | ICD-10-CM | POA: Diagnosis not present

## 2020-12-24 NOTE — Patient Instructions (Signed)
Carlos Miranda  12/24/2020     @PREFPERIOPPHARMACY @   Your procedure is scheduled on 12/29/2020.  Report to [facility] at 9:00 A.M.  Call this number if you have problems the morning of surgery:  [phone]   Remember:  Do not eat or drink after midnight.    Take these medicines the morning of surgery with A SIP OF WATER :none   Do not wear jewelry, make-up or nail polish.  Do not wear lotions, powders, or perfumes, or deodorant.  Do not shave 48 hours prior to surgery.  Men may shave face and neck.  Do not bring valuables to the hospital.  [facility] is not responsible for any belongings or valuables.  Please use the Hibiclens wash in the shower the night before surgery and the am of surgery. Rinse well and dry off with a towel.   Contacts, dentures or bridgework may not be worn into surgery.  Leave your suitcase in the car.  After surgery it may be brought to your room.  For patients admitted to the hospital, discharge time will be determined by your treatment team.  Patients discharged the day of surgery will not be allowed to drive home.   Name and phone number of your driver:   family Special instructions:  n/a  Please read over the following fact sheets that you were given. Care and Recovery After Surgery  Implanted Port Insertion Implanted port insertion is a procedure to put in a port and catheter. The port is a device with an injectable disk that can be accessed by your health care provider. The port is connected to a vein in the chest or neck by a small flexible tube (catheter). There are different types of ports. The implanted port may be used as a long-term IV access for:  Medicines, such as chemotherapy.  Fluids.  Liquid nutrition, such as total parenteral nutrition (TPN). When you have a port, your health care provider can choose to use the port instead of veins in your arms for these procedures. Tell a health care provider about:  Any allergies you  have.  All medicines you are taking, especially blood thinners, as well as any vitamins, herbs, eye drops, creams, over-the-counter medicines, and steroids.  Any problems you or family members have had with anesthetic medicines.  Any blood disorders you have.  Any surgeries you have had.  Any medical conditions you have or have had, including diabetes or kidney problems.  Whether you are pregnant or may be pregnant. What are the risks? Generally, this is a safe procedure. However, problems may occur, including:  Allergic reactions to medicines or dyes.  Damage to other structures or organs.  Infection.  Damage to the blood vessel, bruising, or bleeding at the puncture site.  Blood clot.  Breakdown of the skin over the port.  A collection of air in the chest that can cause one of the lungs to collapse (pneumothorax). This is rare. What happens before the procedure? Staying hydrated Follow instructions from your health care provider about hydration, which may include:  Up to 2 hours before the procedure - you may continue to drink clear liquids, such as water, clear fruit juice, black coffee, and plain tea.   Eating and drinking restrictions Follow instructions from your health care provider about eating and drinking, which may include:  8 hours before the procedure - stop eating heavy meals or foods, such as meat, fried foods, or fatty foods.  6 hours  before the procedure - stop eating light meals or foods, such as toast or cereal.  6 hours before the procedure - stop drinking milk or drinks that contain milk.  2 hours before the procedure - stop drinking clear liquids. Medicines Ask your health care provider about:  Changing or stopping your regular medicines. This is especially important if you are taking diabetes medicines or blood thinners.  Taking medicines such as aspirin and ibuprofen. These medicines can thin your blood. Do not take these medicines unless your  health care provider tells you to take them.  Taking over-the-counter medicines, vitamins, herbs, and supplements. General instructions  Plan to have someone take you home from the hospital or clinic.  If you will be going home right after the procedure, plan to have someone with you for 24 hours.  You may have blood tests.  Do not use any products that contain nicotine or tobacco for at least 4-6 weeks before the procedure. These products include cigarettes, e-cigarettes, and chewing tobacco. If you need help quitting, ask your health care provider.  Ask your health care provider what steps will be taken to help prevent infection. These may include: ? Removing hair at the surgery site. ? Washing skin with a germ-killing soap. ? Taking antibiotic medicine. What happens during the procedure?  An IV will be inserted into one of your veins.  You will be given one or more of the following: ? A medicine to help you relax (sedative). ? A medicine to numb the area (local anesthetic).  Two small incisions will be made to insert the port. ? One smaller incision will be made in your neck to get access to the vein where the catheter will lie. ? The other incision will be made in the upper chest. This is where the port will lie.  The procedure may be done using continuous X-ray (fluoroscopy) or other imaging tools for guidance.  The port and catheter will be placed. There may be a small, raised area where the port is.  The port will be flushed with a salt solution (saline), and blood will be drawn to make sure that it is working correctly.  The incisions will be closed.  Bandages (dressings) may be placed over the incisions. The procedure may vary among health care providers and hospitals.   What happens after the procedure?  Your blood pressure, heart rate, breathing rate, and blood oxygen level will be monitored until you leave the hospital or clinic.  Do not drive for 24 hours if  you were given a sedative during your procedure.  You will be given a manufacturer's information card for the type of port that you have. Keep this with you.  Your port will need to be flushed and checked as told by your health care provider, usually every few weeks.  A chest X-ray will be done to: ? Check the placement of the port. ? Make sure there is no injury to your lung. Summary  Implanted port insertion is a procedure to put in a port and catheter.  The implanted port is used as a long-term IV access.  The port will need to be flushed and checked as told by your health care provider, usually every few weeks.  Keep your manufacturer's information card with you at all times. This information is not intended to replace advice given to you by your health care provider. Make sure you discuss any questions you have with your health care provider. Document Revised:  11/27/2019 Document Reviewed: 05/14/2018 Elsevier Patient Education  [place].

## 2020-12-27 ENCOUNTER — Ambulatory Visit (HOSPITAL_COMMUNITY): Payer: BLUE CROSS/BLUE SHIELD | Admitting: Hematology

## 2020-12-28 ENCOUNTER — Ambulatory Visit (HOSPITAL_COMMUNITY): Payer: BLUE CROSS/BLUE SHIELD | Admitting: Hematology

## 2020-12-28 ENCOUNTER — Other Ambulatory Visit (HOSPITAL_COMMUNITY)
Admission: RE | Admit: 2020-12-28 | Discharge: 2020-12-28 | Disposition: A | Discharge status: Home / Self Care | Payer: BLUE CROSS/BLUE SHIELD | Source: Ambulatory Visit | Attending: General Surgery | Admitting: General Surgery

## 2020-12-28 ENCOUNTER — Other Ambulatory Visit: Payer: Self-pay

## 2020-12-28 DIAGNOSIS — Z882 Allergy status to sulfonamides status: Secondary | ICD-10-CM | POA: Diagnosis not present

## 2020-12-28 DIAGNOSIS — Z20822 Contact with and (suspected) exposure to covid-19: Secondary | ICD-10-CM | POA: Insufficient documentation

## 2020-12-28 DIAGNOSIS — Z01812 Encounter for preprocedural laboratory examination: Secondary | ICD-10-CM | POA: Insufficient documentation

## 2020-12-28 DIAGNOSIS — Z79899 Other long term (current) drug therapy: Secondary | ICD-10-CM | POA: Diagnosis not present

## 2020-12-28 DIAGNOSIS — C189 Malignant neoplasm of colon, unspecified: Secondary | ICD-10-CM | POA: Diagnosis not present

## 2020-12-28 DIAGNOSIS — Z881 Allergy status to other antibiotic agents status: Secondary | ICD-10-CM | POA: Diagnosis not present

## 2020-12-28 DIAGNOSIS — C779 Secondary and unspecified malignant neoplasm of lymph node, unspecified: Secondary | ICD-10-CM | POA: Diagnosis not present

## 2020-12-28 DIAGNOSIS — Z880 Allergy status to penicillin: Secondary | ICD-10-CM | POA: Diagnosis not present

## 2020-12-29 ENCOUNTER — Ambulatory Visit (HOSPITAL_COMMUNITY): Payer: BLUE CROSS/BLUE SHIELD

## 2020-12-29 ENCOUNTER — Ambulatory Visit (HOSPITAL_COMMUNITY): Payer: BLUE CROSS/BLUE SHIELD | Admitting: Anesthesiology

## 2020-12-29 ENCOUNTER — Other Ambulatory Visit: Payer: Self-pay

## 2020-12-29 ENCOUNTER — Ambulatory Visit (HOSPITAL_COMMUNITY)
Admission: RE | Admit: 2020-12-29 | Discharge: 2020-12-29 | Disposition: A | Discharge status: Home / Self Care | Payer: BLUE CROSS/BLUE SHIELD | Attending: General Surgery | Admitting: General Surgery

## 2020-12-29 ENCOUNTER — Encounter (HOSPITAL_COMMUNITY)
Admission: RE | Disposition: A | Discharge status: Home / Self Care | Payer: Self-pay | Source: Home / Self Care | Attending: General Surgery

## 2020-12-29 DIAGNOSIS — Z79899 Other long term (current) drug therapy: Secondary | ICD-10-CM | POA: Insufficient documentation

## 2020-12-29 DIAGNOSIS — Z880 Allergy status to penicillin: Secondary | ICD-10-CM | POA: Insufficient documentation

## 2020-12-29 DIAGNOSIS — C182 Malignant neoplasm of ascending colon: Secondary | ICD-10-CM

## 2020-12-29 DIAGNOSIS — C779 Secondary and unspecified malignant neoplasm of lymph node, unspecified: Secondary | ICD-10-CM | POA: Insufficient documentation

## 2020-12-29 DIAGNOSIS — Z882 Allergy status to sulfonamides status: Secondary | ICD-10-CM | POA: Insufficient documentation

## 2020-12-29 DIAGNOSIS — J9811 Atelectasis: Secondary | ICD-10-CM | POA: Diagnosis not present

## 2020-12-29 DIAGNOSIS — Z20822 Contact with and (suspected) exposure to covid-19: Secondary | ICD-10-CM | POA: Insufficient documentation

## 2020-12-29 DIAGNOSIS — Z452 Encounter for adjustment and management of vascular access device: Secondary | ICD-10-CM

## 2020-12-29 DIAGNOSIS — C189 Malignant neoplasm of colon, unspecified: Secondary | ICD-10-CM | POA: Diagnosis not present

## 2020-12-29 DIAGNOSIS — Z881 Allergy status to other antibiotic agents status: Secondary | ICD-10-CM | POA: Insufficient documentation

## 2020-12-29 HISTORY — PX: PORTACATH PLACEMENT: SHX2246

## 2020-12-29 LAB — SARS CORONAVIRUS 2 (TAT 6-24 HRS): SARS Coronavirus 2: NEGATIVE

## 2020-12-29 SURGERY — INSERTION, TUNNELED CENTRAL VENOUS DEVICE, WITH PORT
Anesthesia: General | Site: Chest | Laterality: Left

## 2020-12-29 MED ORDER — HYDROCODONE-ACETAMINOPHEN 5-325 MG PO TABS: 1.0000 | ORAL_TABLET | ORAL | 0 refills | Status: DC | PRN

## 2020-12-29 MED ORDER — ONDANSETRON HCL 4 MG/2ML IJ SOLN
INTRAMUSCULAR | Status: DC | PRN
  Administered 2020-12-29: 4 mg via INTRAVENOUS

## 2020-12-29 MED ORDER — FENTANYL CITRATE (PF) 100 MCG/2ML IJ SOLN
INTRAMUSCULAR | Status: DC | PRN
  Administered 2020-12-29 (×2): 50 ug via INTRAVENOUS

## 2020-12-29 MED ORDER — ONDANSETRON HCL 4 MG/2ML IJ SOLN
INTRAMUSCULAR | Status: AC
  Filled 2020-12-29: qty 2

## 2020-12-29 MED ORDER — VANCOMYCIN HCL IN DEXTROSE 1-5 GM/200ML-% IV SOLN
INTRAVENOUS | Status: AC
  Filled 2020-12-29: qty 200

## 2020-12-29 MED ORDER — VANCOMYCIN HCL IN DEXTROSE 1-5 GM/200ML-% IV SOLN
1000.0000 mg | INTRAVENOUS | Status: AC
  Administered 2020-12-29: 1000 mg via INTRAVENOUS

## 2020-12-29 MED ORDER — HEPARIN SOD (PORK) LOCK FLUSH 100 UNIT/ML IV SOLN
INTRAVENOUS | Status: DC | PRN
  Administered 2020-12-29 (×2): 500 [IU] via INTRAVENOUS

## 2020-12-29 MED ORDER — FENTANYL CITRATE (PF) 100 MCG/2ML IJ SOLN
INTRAMUSCULAR | Status: AC
  Filled 2020-12-29: qty 2

## 2020-12-29 MED ORDER — HEPARIN SOD (PORK) LOCK FLUSH 100 UNIT/ML IV SOLN
INTRAVENOUS | Status: AC
  Filled 2020-12-29: qty 5

## 2020-12-29 MED ORDER — SODIUM CHLORIDE (PF) 0.9 % IJ SOLN
INTRAMUSCULAR | Status: DC | PRN
  Administered 2020-12-29: 10 mL via INTRAVENOUS

## 2020-12-29 MED ORDER — CHLORHEXIDINE GLUCONATE 0.12 % MT SOLN
15.0000 mL | Freq: Once | OROMUCOSAL | Status: AC
  Administered 2020-12-29: 15 mL via OROMUCOSAL

## 2020-12-29 MED ORDER — ORAL CARE MOUTH RINSE: 15.0000 mL | Freq: Once | OROMUCOSAL | Status: AC

## 2020-12-29 MED ORDER — HYDROMORPHONE HCL 1 MG/ML IJ SOLN: 0.2500 mg | INTRAMUSCULAR | Status: DC | PRN

## 2020-12-29 MED ORDER — CHLORHEXIDINE GLUCONATE CLOTH 2 % EX PADS: 6.0000 | MEDICATED_PAD | Freq: Once | CUTANEOUS | Status: DC

## 2020-12-29 MED ORDER — MIDAZOLAM HCL 2 MG/2ML IJ SOLN
INTRAMUSCULAR | Status: AC
  Filled 2020-12-29: qty 2

## 2020-12-29 MED ORDER — LIDOCAINE HCL (PF) 1 % IJ SOLN
INTRAMUSCULAR | Status: AC
  Filled 2020-12-29: qty 30

## 2020-12-29 MED ORDER — MIDAZOLAM HCL 5 MG/5ML IJ SOLN
INTRAMUSCULAR | Status: DC | PRN
  Administered 2020-12-29: 2 mg via INTRAVENOUS

## 2020-12-29 MED ORDER — PROPOFOL 10 MG/ML IV BOLUS
INTRAVENOUS | Status: AC
  Filled 2020-12-29: qty 20

## 2020-12-29 MED ORDER — ONDANSETRON HCL 4 MG/2ML IJ SOLN: 4.0000 mg | Freq: Once | INTRAMUSCULAR | Status: DC | PRN

## 2020-12-29 MED ORDER — LIDOCAINE HCL (PF) 1 % IJ SOLN
INTRAMUSCULAR | Status: DC | PRN
  Administered 2020-12-29: 5 mL

## 2020-12-29 MED ORDER — PROPOFOL 10 MG/ML IV BOLUS
INTRAVENOUS | Status: DC | PRN
  Administered 2020-12-29: 160 mg via INTRAVENOUS

## 2020-12-29 MED ORDER — EPHEDRINE SULFATE-NACL 50-0.9 MG/10ML-% IV SOSY
PREFILLED_SYRINGE | INTRAVENOUS | Status: DC | PRN
  Administered 2020-12-29 (×3): 5 mg via INTRAVENOUS

## 2020-12-29 MED ORDER — LIDOCAINE HCL (CARDIAC) PF 100 MG/5ML IV SOSY
PREFILLED_SYRINGE | INTRAVENOUS | Status: DC | PRN
  Administered 2020-12-29: 100 mg via INTRATRACHEAL

## 2020-12-29 MED ORDER — LACTATED RINGERS IV SOLN: INTRAVENOUS | Status: DC

## 2020-12-29 SURGICAL SUPPLY — 36 items
ADH SKN CLS APL DERMABOND .7 (GAUZE/BANDAGES/DRESSINGS) ×1
APL PRP STRL LF ISPRP CHG 10.5 (MISCELLANEOUS) ×1
APPLICATOR CHLORAPREP 10.5 ORG (MISCELLANEOUS) ×2 IMPLANT
APPLIER CLIP 9.375 SM OPEN (CLIP)
APR CLP SM 9.3 20 MLT OPN (CLIP)
BAG DECANTER FOR FLEXI CONT (MISCELLANEOUS) ×2 IMPLANT
CLIP APPLIE 9.375 SM OPEN (CLIP) IMPLANT
CLOTH BEACON ORANGE TIMEOUT ST (SAFETY) ×2 IMPLANT
COVER LIGHT HANDLE STERIS (MISCELLANEOUS) ×4 IMPLANT
COVER WAND RF STERILE (DRAPES) ×2 IMPLANT
DECANTER SPIKE VIAL GLASS SM (MISCELLANEOUS) ×2 IMPLANT
DERMABOND ADVANCED (GAUZE/BANDAGES/DRESSINGS) ×1
DERMABOND ADVANCED .7 DNX12 (GAUZE/BANDAGES/DRESSINGS) ×1 IMPLANT
DRAPE C-ARM FOLDED MOBILE STRL (DRAPES) ×2 IMPLANT
ELECT REM PT RETURN 9FT ADLT (ELECTROSURGICAL) ×2
ELECTRODE REM PT RTRN 9FT ADLT (ELECTROSURGICAL) ×1 IMPLANT
GLOVE SURG ENC MOIS LTX SZ6.5 (GLOVE) ×2 IMPLANT
GLOVE SURG ENC TEXT LTX SZ7 (GLOVE) ×2 IMPLANT
GLOVE SURG UNDER POLY LF SZ6.5 (GLOVE) ×2 IMPLANT
GLOVE SURG UNDER POLY LF SZ7 (GLOVE) ×2 IMPLANT
GOWN STRL REUS W/TWL LRG LVL3 (GOWN DISPOSABLE) ×4 IMPLANT
IV NS 500ML (IV SOLUTION) ×2
IV NS 500ML BAXH (IV SOLUTION) ×1 IMPLANT
KIT PORT POWER 8FR ISP MRI (Port) ×4 IMPLANT
KIT TURNOVER KIT A (KITS) ×2 IMPLANT
MANIFOLD NEPTUNE II (INSTRUMENTS) ×2 IMPLANT
NEEDLE HYPO 25X1 1.5 SAFETY (NEEDLE) ×2 IMPLANT
PACK MINOR (CUSTOM PROCEDURE TRAY) ×2 IMPLANT
PAD ARMBOARD 7.5X6 YLW CONV (MISCELLANEOUS) ×2 IMPLANT
SET BASIN LINEN APH (SET/KITS/TRAYS/PACK) ×2 IMPLANT
SUT MNCRL AB 4-0 PS2 18 (SUTURE) ×2 IMPLANT
SUT PROLENE 2 0 SH 30 (SUTURE) ×2 IMPLANT
SUT VIC AB 3-0 SH 27 (SUTURE) ×2
SUT VIC AB 3-0 SH 27X BRD (SUTURE) ×1 IMPLANT
SYR 10ML LL (SYRINGE) ×4 IMPLANT
SYR CONTROL 10ML LL (SYRINGE) ×2 IMPLANT

## 2020-12-29 NOTE — Transfer of Care (Signed)
Immediate Anesthesia Transfer of Care Note  Patient: Carlos Miranda  Procedure(s) Performed: INSERTION PORT-A-CATH (Left Chest)  Patient Location: PACU  Anesthesia Type:General  Level of Consciousness: awake  Airway & Oxygen Therapy: Patient Spontanous Breathing  Post-op Assessment: Report given to RN and Post -op Vital signs reviewed and stable  Post vital signs: Reviewed and stable  Last Vitals:  Vitals Value Taken Time  BP 117/75 12/29/20 1208  Temp    Pulse 86 12/29/20 1209  Resp 21 12/29/20 1209  SpO2 93 % 12/29/20 1209  Vitals shown include unvalidated device data.  Last Pain:  Vitals:   12/29/20 0945  PainSc: 0-No pain         Complications: No complications documented.

## 2020-12-29 NOTE — Anesthesia Postprocedure Evaluation (Signed)
Anesthesia Post Note  Patient: Carlos Miranda  Procedure(s) Performed: INSERTION PORT-A-CATH (Left Chest)  Patient location during evaluation: PACU Anesthesia Type: General Level of consciousness: awake Pain management: pain level controlled Vital Signs Assessment: post-procedure vital signs reviewed and stable Respiratory status: spontaneous breathing, nonlabored ventilation and respiratory function stable Cardiovascular status: stable Postop Assessment: no apparent nausea or vomiting Anesthetic complications: no   No complications documented.   Last Vitals:  Vitals:   12/29/20 1300 12/29/20 1310  BP: 125/84 (!) 120/94  Pulse: 84 88  Resp: 14 18  Temp:  36.7 C  SpO2: 97% 97%    Last Pain:  Vitals:   12/29/20 1245  PainSc: 0-No pain                 Tereka Thorley Hristova

## 2020-12-29 NOTE — Discharge Instructions (Signed)
Keep area clean and dry. You can take a shower in 24 hours. Do not submerge in water.  Take tylenol and ibuprofen for pain control and Norco for severe pain.   Implanted Port Insertion, Care After This sheet gives you information about how to care for yourself after your procedure. Your health care provider may also give you more specific instructions. If you have problems or questions, contact your health care provider. What can I expect after the procedure? After the procedure, it is common to have:  Discomfort at the port insertion site.  Bruising on the skin over the port. This should improve over 3-4 days. Follow these instructions at home: [facility] care  After your port is placed, you will get a manufacturer's information card. The card has information about your port. Keep this card with you at all times.  Take care of the port as told by your health care provider. Ask your health care provider if you or a family member can get training for taking care of the port at home. A home health care nurse may also take care of the port.  Make sure to remember what type of port you have. Incision care  Follow instructions from your health care provider about how to take care of your port insertion site. Make sure you: ? Wash your hands with soap and water before and after you change your bandage (dressing). If soap and water are not available, use hand sanitizer. ? Change your dressing as told by your health care provider. ? Leave stitches (sutures), skin glue, or adhesive strips in place. These skin closures may need to stay in place for 2 weeks or longer. If adhesive strip edges start to loosen and curl up, you may trim the loose edges. Do not remove adhesive strips completely unless your health care provider tells you to do that.  Check your port insertion site every day for signs of infection. Check for: ? Redness, swelling, or pain. ? Fluid or blood. ? Warmth. ? Pus or a bad smell.       Activity  Return to your normal activities as told by your health care provider. Ask your health care provider what activities are safe for you.  Do not lift anything that is heavier than 10 lb (4.5 kg), or the limit that you are told, until your health care provider says that it is safe. General instructions  Take over-the-counter and prescription medicines only as told by your health care provider.  Do not take baths, swim, or use a hot tub until your health care provider approves. You may shower.  Do not drive for 24 hours if you were given a sedative during your procedure.  Wear a medical alert bracelet in case of an emergency. This will tell any health care providers that you have a port.  Keep all follow-up visits as told by your health care provider. This is important. Contact a health care provider if:  You cannot flush your port with saline as directed, or you cannot draw blood from the port.  You have a fever or chills.  You have redness, swelling, or pain around your port insertion site.  You have fluid or blood coming from your port insertion site.  Your port insertion site feels warm to the touch.  You have pus or a bad smell coming from the port insertion site. Get help right away if:  You have chest pain or shortness of breath.  You have bleeding from  your port that you cannot control. Summary  Take care of the port as told by your health care provider. Keep the manufacturer's information card with you at all times.  Change your dressing as told by your health care provider.  Contact a health care provider if you have a fever or chills or if you have redness, swelling, or pain around your port insertion site.  Keep all follow-up visits as told by your health care provider. This information is not intended to replace advice given to you by your health care provider. Make sure you discuss any questions you have with your health care provider. Document  Revised: 05/14/2018 Document Reviewed: 05/14/2018 Elsevier Patient Education  2021 [place].  Acetaminophen; Hydrocodone tablets or capsules What is this medicine? ACETAMINOPHEN; HYDROCODONE (a set a MEE noe fen; hye droe KOE done) is a pain reliever. It is used to treat moderate to severe pain. This medicine may be used for other purposes; ask your health care provider or pharmacist if you have questions. COMMON BRAND NAME(S): Anexsia, Bancap HC, Ceta-Plus, Co-Gesic, Comfortpak, Dolagesic, [employer], DuoCet, Hydrocet, Hydrogesic, Max[place], Lorcet HD, Lorcet Plus, Lortab, Margesic H, Maxidone, Puako, Polygesic, [place], [place], Cabin crew, Vicodin, Vicodin ES, Vicodin HP, Charlane Ferretti What should I tell my health care provider before I take this medicine? They need to know if you have any of these conditions:  brain tumor  drug abuse or addiction  head injury  heart disease  if you often drink alcohol  kidney disease  liver disease  low adrenal gland function  lung disease, asthma, or breathing problems  seizures  stomach or intestine problems  taken an MAOI like Marplan, Nardil, or Parnate in the last 14 days  an unusual or allergic reaction to acetaminophen, hydrocodone, other medicines, foods, dyes, or preservatives  pregnant or trying to get pregnant  breast-feeding How should I use this medicine? Take this medicine by mouth with a glass of water. Follow the directions on the prescription label. You can take it with or without food. If it upsets your stomach, take it with food. Do not take your medicine more often than directed. A special MedGuide will be given to you by the pharmacist with each prescription and refill. Be sure to read this information carefully each time. Talk to your pediatrician regarding the use of this medicine in children. Special care may be needed. Overdosage: If you think you have taken too much of this medicine contact a poison  control center or emergency room at once. NOTE: This medicine is only for you. Do not share this medicine with others. What if I miss a dose? If you miss a dose, take it as soon as you can. If it is almost time for your next dose, take only that dose. Do not take double or extra doses. What may interact with this medicine? This medicine may interact with the following medications:  alcohol  antiviral medicines for HIV or AIDS  atropine  antihistamines for allergy, cough and cold  certain antibiotics like erythromycin, clarithromycin  certain medicines for anxiety or sleep  certain medicines for bladder problems like oxybutynin, tolterodine  certain medicines for depression like amitriptyline, fluoxetine, sertraline  certain medicines for fungal infections like ketoconazole and itraconazole  certain medicines for Parkinson's disease like benztropine, trihexyphenidyl  certain medicines for seizures like carbamazepine, phenobarbital, phenytoin, primidone  certain medicines for stomach problems like dicyclomine, hyoscyamine  certain medicines for travel sickness like scopolamine  general anesthetics like halothane, isoflurane,  methoxyflurane, propofol  ipratropium  local anesthetics like lidocaine, pramoxine, tetracaine  MAOIs like Carbex, Eldepryl, Marplan, Nardil, and Parnate  medicines that relax muscles for surgery  other medicines with acetaminophen  other narcotic medicines for pain or cough  phenothiazines like chlorpromazine, mesoridazine, prochlorperazine, thioridazine  rifampin This list may not describe all possible interactions. Give your health care provider a list of all the medicines, herbs, non-prescription drugs, or dietary supplements you use. Also tell them if you smoke, drink alcohol, or use illegal drugs. Some items may interact with your medicine. What should I watch for while using this medicine? Tell your health care provider if your pain does  not go away, if it gets worse, or if you have new or a different type of pain. You may develop tolerance to this drug. Tolerance means that you will need a higher dose of the drug for pain relief. Tolerance is normal and is expected if you take this drug for a long time. There are different types of narcotic drugs (opioids) for pain. If you take more than one type at the same time, you may have more side effects. Give your health care provider a list of all drugs you use. He or she will tell you how much drug to take. Do not take more drug than directed. Get emergency help right away if you have problems breathing. Do not suddenly stop taking your drug because you may develop a severe reaction. Your body becomes used to the drug. This does NOT mean you are addicted. Addiction is a behavior related to getting and using a drug for a nonmedical reason. If you have pain, you have a medical reason to take pain drug. Your health care provider will tell you how much drug to take. If your health care provider wants you to stop the drug, the dose will be slowly lowered over time to avoid any side effects. Talk to your health care provider about naloxone and how to get it. Naloxone is an emergency drug used for an opioid overdose. An overdose can happen if you take too much opioid. It can also happen if an opioid is taken with some other drugs or substances, like alcohol. Know the symptoms of an overdose, like trouble breathing, unusually tired or sleepy, or not being able to respond or wake up. Make sure to tell caregivers and close contacts where it is stored. Make sure they know how to use it. After naloxone is given, you must get emergency help right away. Naloxone is a temporary treatment. Repeat doses may be needed. Do not take other drugs that contain acetaminophen with this drug. Many non-prescription drugs contain acetaminophen. Always read labels carefully. If you have questions, ask your health care  provider. If you take too much acetaminophen, get medical help right away. Too much acetaminophen can be very dangerous and cause liver damage. Even if you do not have symptoms, it is important to get help right away. You may get drowsy or dizzy. Do not drive, use machinery, or do anything that needs mental alertness until you know how this drug affects you. Do not stand up or sit up quickly, especially if you are an older patient. This reduces the risk of dizzy or fainting spells. Alcohol may interfere with the effect of this drug. Avoid alcoholic drinks. This drug will cause constipation. If you do not have a bowel movement for 3 days, call your health care provider. Your mouth may get dry. Chewing sugarless gum or  sucking hard candy and drinking plenty of water may help. Contact your health care provider if the problem does not go away or is severe. What side effects may I notice from receiving this medicine? Side effects that you should report to your doctor or health care professional as soon as possible:  allergic reactions like skin rash, itching or hives, swelling of the face, lips, or tongue  breathing problems  confusion  redness, blistering, peeling or loosening of the skin, including inside the mouth  signs and symptoms of low blood pressure like dizziness; feeling faint or lightheaded, falls; unusually weak or tired  trouble passing urine or change in the amount of urine  yellowing of the eyes or skin Side effects that usually do not require medical attention (report to your doctor or health care professional if they continue or are bothersome):  constipation  dry mouth  nausea, vomiting  tiredness This list may not describe all possible side effects. Call your doctor for medical advice about side effects. You may report side effects to FDA at 1-800-FDA-1088. Where should I keep my medicine? Keep out of the reach of children. This medicine can be abused. Keep your  medicine in a safe place to protect it from theft. Do not share this medicine with anyone. Selling or giving away this medicine is dangerous and against the law. Store at room temperature between 15 and 30 degrees C (59 and 86 degrees F). This medicine may cause harm and death if it is taken by other adults, children, or pets. Return medicine that has not been used to an official disposal site. Contact the DEA at 406-305-9657 or your city/county government to find a site. If you cannot return the medicine, flush it down the toilet. Do not use the medicine after the expiration date. NOTE: This sheet is a summary. It may not cover all possible information. If you have questions about this medicine, talk to your doctor, pharmacist, or health care provider.  2021 Elsevier/Gold Standard (2019-05-28 12:25:54)   PATIENT INSTRUCTIONS POST-ANESTHESIA  IMMEDIATELY FOLLOWING SURGERY:  Do not drive or operate machinery for the first twenty four hours after surgery.  Do not make any important decisions for twenty four hours after surgery or while taking narcotic pain medications or sedatives.  If you develop intractable nausea and vomiting or a severe headache please notify your doctor immediately.  FOLLOW-UP:  Please make an appointment with your surgeon as instructed. You do not need to follow up with anesthesia unless specifically instructed to do so.  WOUND CARE INSTRUCTIONS (if applicable):  Keep a dry clean dressing on the anesthesia/puncture wound site if there is drainage.  Once the wound has quit draining you may leave it open to air.  Generally you should leave the bandage intact for twenty four hours unless there is drainage.  If the epidural site drains for more than 36-48 hours please call the anesthesia department.  QUESTIONS?:  Please feel free to call your physician or the hospital operator if you have any questions, and they will be happy to assist you.

## 2020-12-29 NOTE — Anesthesia Preprocedure Evaluation (Addendum)
Anesthesia Evaluation  Patient identified by MRN, date of birth, ID band Patient awake    Reviewed: Allergy & Precautions, NPO status , Patient's Chart, lab work & pertinent test results  History of Anesthesia Complications Negative for: history of anesthetic complications  Airway        Dental  (+) Dental Advisory Given   Pulmonary sleep apnea ,    Pulmonary exam normal breath sounds clear to auscultation       Cardiovascular Exercise Tolerance: Good hypertension, Pt. on medications Normal cardiovascular exam Rhythm:Regular Rate:Normal  1. The left ventricle has normal systolic function with an ejection  fraction of 60-65%. The cavity size was normal. There is mildly increased  left ventricular wall thickness. Left ventricular diastolic Doppler  parameters are consistent with impaired  relaxation.  2. The right ventricle has normal systolic function. The cavity was  normal. There is no increase in right ventricular wall thickness.  3. No evidence of mitral valve stenosis.  4. The aortic valve is tricuspid. No stenosis of the aortic valve.  5. The aorta is normal unless otherwise noted.  6. Pulmonary hypertension is indeterminate, inadequate TR jet.    Neuro/Psych Seizures -, Well Controlled,  negative psych ROS   GI/Hepatic Neg liver ROS, Colon cancer    Endo/Other  negative endocrine ROS  Renal/GU negative Renal ROS     Musculoskeletal negative musculoskeletal ROS (+)   Abdominal   Peds  Hematology negative hematology ROS (+)   Anesthesia Other Findings Colon cancer  Reproductive/Obstetrics negative OB ROS                            Anesthesia Physical Anesthesia Plan  ASA: III  Anesthesia Plan: General   Post-op Pain Management:    Induction: Intravenous  PONV Risk Score and Plan: Ondansetron, Dexamethasone and Midazolam  Airway Management Planned:  LMA  Additional Equipment:   Intra-op Plan:   Post-operative Plan: Extubation in OR  Informed Consent: I have reviewed the patients History and Physical, chart, labs and discussed the procedure including the risks, benefits and alternatives for the proposed anesthesia with the patient or authorized representative who has indicated his/her understanding and acceptance.     Dental advisory given  Plan Discussed with: CRNA and Surgeon  Anesthesia Plan Comments:        Anesthesia Quick Evaluation

## 2020-12-29 NOTE — Op Note (Signed)
Operative Note 12/29/20   Preoperative Diagnosis:  Colon cancer    Postoperative Diagnosis: Same   Procedure(s) Performed: Port-A-Cath placement, left subclavian    Surgeon: Lanell Matar. Constance Haw, MD   Assistants: No qualified resident was available   Anesthesia: General anesthesia with LMA   Anesthesiologist: Denese Killings, MD    Specimens: None   Estimated Blood Loss: Minimal   Fluoroscopy time: 29 seconds   Blood Replacement: None    Complications: None    Operative Findings:  Normal anatomy  Indications: Mr. Cory is a 60 yo with colon cancer who is going to undergo adjuvant therapy. We discussed the risk of port placement and the risk of bleeding, infection, pneumothorax, and injury to the vessels.   Procedure: The patient was brought into the operating room and anesthesia was induced.  One percent lidocaine was used for local anesthesia.   The left chest and neck was prepped and draped in the usual sterile fashion.  Preoperative antibiotics were given.  An incision was made below the left clavicle. A subcutaneous pocket was formed. The needles advanced into the left subclavian vein using the Seldinger technique without difficulty. A guidewire was then advanced into the right atrium under fluoroscopic guidance.  Ectopia was not noted. An introducer and peel-away sheath were placed over the guidewire. The catheter was then inserted through the peel-away sheath and the peel-away sheath was removed.  A spot film was performed to confirm the position. The catheter was then attached to the port and the port placed in subcutaneous pocket. Blood pulled back easily and saline flushed easily. Positioning was confirmed by fluoroscopy but the catheter appeared somewhat deep in the atria so I did not want to leave it that deep. I cut the catheter from the port and replaced the port and locking plastic piece.  After attempting aspiration, I could no longer get blood back and got some air.  I was worried that I had broken the catheter or the connecting plastic piece. I cut the port from the catheter again, and with fluoroscopy reinserted th wire.  I confirmed the wire was in appropriate position and no ectopia was noted.  A new kit was obtained and an introducer and peel-away sheath were placed over the guidewire. The catheter was then inserted through the peel-away sheath and the peel-away sheath was removed.  A spot film was performed to confirm the position. The catheter was then attached to the port and the port placed in subcutaneous pocket. Positioning was confirmed by fluoroscopy. Hemostasis was confirmed, and the port was secured with 2-0 prolene sutures.  Good backflow of blood was noted on aspiration of the port. The port was flushed with heparin flush. Subcutaneous layer was reapproximated using a 3-0 Vicryl interrupted suture. The skin was closed using a 4-0 Vicryl subcuticular suture. Dermabond was applied.    All tape and needle counts were correct at the end of the procedure. The patient was transferred to PACU in stable condition. A chest x-ray will be performed at that time.  Curlene Labrum, MD [facility] [address] [place], [place] [address] [phone] (office)

## 2020-12-29 NOTE — Anesthesia Procedure Notes (Signed)
Procedure Name: LMA Insertion Date/Time: 12/29/2020 11:00 AM Performed by: Hewitt Blade, CRNA Pre-anesthesia Checklist: Patient identified, Emergency Drugs available, Suction available and Patient being monitored Patient Re-evaluated:Patient Re-evaluated prior to induction Oxygen Delivery Method: Circle system utilized Preoxygenation: Pre-oxygenation with 100% oxygen Induction Type: IV induction Ventilation: Mask ventilation without difficulty LMA: LMA inserted LMA Size: 4.0 Number of attempts: 1 Airway Equipment and Method: Oral airway Placement Confirmation: positive ETCO2 and breath sounds checked- equal and bilateral Tube secured with: Tape Dental Injury: Teeth and Oropharynx as per pre-operative assessment

## 2020-12-29 NOTE — Interval H&P Note (Signed)
History and Physical Interval Note:  12/29/2020 10:20 AM  Carlos Miranda  has presented today for surgery, with the diagnosis of Colon Cancer.  The various methods of treatment have been discussed with the patient and family. After consideration of risks, benefits and other options for treatment, the patient has consented to  Procedure(s): INSERTION PORT-A-CATH as a surgical intervention.  The patient's history has been reviewed, patient examined, no change in status, stable for surgery.  I have reviewed the patient's chart and labs.  Questions were answered to the patient's satisfaction.    No changes.   Virl Cagey

## 2020-12-29 NOTE — Progress Notes (Signed)
[facility]  Notified father that port was placed. Will send in pain meds to pharmacy. PRN follow up with me. CXR ordered.  Curlene Labrum, MD [facility] [address] [place], [place] [address] [phone] (office)

## 2020-12-29 NOTE — Progress Notes (Signed)
[facility]  CXR without ptx and good position.  Curlene Labrum, MD [facility] [address] [place], [place] [address] [phone] (office)

## 2020-12-30 ENCOUNTER — Encounter (HOSPITAL_COMMUNITY): Payer: Self-pay | Admitting: General Surgery

## 2021-01-05 ENCOUNTER — Encounter: Payer: Self-pay | Admitting: *Deleted

## 2021-01-06 ENCOUNTER — Other Ambulatory Visit (HOSPITAL_COMMUNITY): Payer: Self-pay

## 2021-01-06 DIAGNOSIS — C182 Malignant neoplasm of ascending colon: Secondary | ICD-10-CM

## 2021-01-06 NOTE — Progress Notes (Signed)
ct 

## 2021-01-11 ENCOUNTER — Ambulatory Visit (HOSPITAL_COMMUNITY): Payer: BLUE CROSS/BLUE SHIELD

## 2021-01-11 ENCOUNTER — Ambulatory Visit
Admission: RE | Admit: 2021-01-11 | Discharge: 2021-01-11 | Disposition: A | Discharge status: Home / Self Care | Payer: BLUE CROSS/BLUE SHIELD | Source: Ambulatory Visit | Attending: Hematology | Admitting: Hematology

## 2021-01-11 DIAGNOSIS — C182 Malignant neoplasm of ascending colon: Secondary | ICD-10-CM

## 2021-01-11 MED ORDER — IOPAMIDOL (ISOVUE-300) INJECTION 61%
100.0000 mL | Freq: Once | INTRAVENOUS | Status: AC | PRN
  Administered 2021-01-11: 100 mL via INTRAVENOUS

## 2021-01-18 ENCOUNTER — Inpatient Hospital Stay (HOSPITAL_COMMUNITY): Payer: BLUE CROSS/BLUE SHIELD | Attending: Hematology | Admitting: Hematology

## 2021-01-18 ENCOUNTER — Other Ambulatory Visit: Payer: Self-pay

## 2021-01-18 VITALS — BP 140/83 | HR 76 | Temp 97.5°F | Resp 20 | Wt 227.8 lb

## 2021-01-18 DIAGNOSIS — Z79899 Other long term (current) drug therapy: Secondary | ICD-10-CM | POA: Diagnosis not present

## 2021-01-18 DIAGNOSIS — Z8261 Family history of arthritis: Secondary | ICD-10-CM | POA: Diagnosis not present

## 2021-01-18 DIAGNOSIS — Z8269 Family history of other diseases of the musculoskeletal system and connective tissue: Secondary | ICD-10-CM | POA: Insufficient documentation

## 2021-01-18 DIAGNOSIS — C182 Malignant neoplasm of ascending colon: Secondary | ICD-10-CM

## 2021-01-18 DIAGNOSIS — I700 Atherosclerosis of aorta: Secondary | ICD-10-CM | POA: Insufficient documentation

## 2021-01-18 DIAGNOSIS — Z8249 Family history of ischemic heart disease and other diseases of the circulatory system: Secondary | ICD-10-CM | POA: Insufficient documentation

## 2021-01-18 DIAGNOSIS — G479 Sleep disorder, unspecified: Secondary | ICD-10-CM | POA: Insufficient documentation

## 2021-01-18 DIAGNOSIS — R519 Headache, unspecified: Secondary | ICD-10-CM | POA: Diagnosis not present

## 2021-01-18 DIAGNOSIS — Z5111 Encounter for antineoplastic chemotherapy: Secondary | ICD-10-CM | POA: Insufficient documentation
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
## 2021-01-18 NOTE — Progress Notes (Signed)
START ON PATHWAY REGIMEN - Colorectal     A cycle is every 14 days:     Oxaliplatin      Leucovorin      Fluorouracil      Fluorouracil   **Always confirm dose/schedule in your pharmacy ordering system**  Patient Characteristics: Postoperative without Neoadjuvant Therapy (Pathologic Staging), Colon, Stage III, High Risk (pT4 or pN2) Tumor Location: Colon Therapeutic Status: Postoperative without Neoadjuvant Therapy (Pathologic Staging) AJCC M Category: cM0 AJCC T Category: pT4a AJCC N Category: pN2b AJCC 8 Stage Grouping: IIIC Intent of Therapy: Curative Intent, Discussed with Patient

## 2021-01-18 NOTE — Patient Instructions (Signed)
[place] at [facility] Discharge Instructions  You were seen today by Dr. Delton Coombes. He went over your recent results and scans. Given that your colon cancer is stage III, you will need to start chemo. The regimen consists of FOLFOX given every 2 weeks through your port, after which you will carry a chemo pump for 2 days. Side effects include nausea, vomiting, diarrhea, numbness, tingling and cold sensitivity. The approximate total duration is 12 cycles (6 months). Your treatments will begin on Tuesday, March 29. Dr. Delton Coombes will see you back in 3 weeks for labs and follow up.   Thank you for choosing [place] at [facility] to provide your oncology and hematology care.  To afford each patient quality time with our provider, please arrive at least 15 minutes before your scheduled appointment time.   If you have a lab appointment with the [facility] please come in thru the Main Entrance and check in at the main information desk  You need to re-schedule your appointment should you arrive 10 or more minutes late.  We strive to give you quality time with our providers, and arriving late affects you and other patients whose appointments are after yours.  Also, if you no show three or more times for appointments you may be dismissed from the clinic at the providers discretion.     Again, thank you for choosing [facility].  Our hope is that these requests will decrease the amount of time that you wait before being seen by our physicians.       _____________________________________________________________  Should you have questions after your visit to [facility], please contact our office at [phone] between the hours of 8:00 a.m. and 4:30 p.m.  Voicemails left after 4:00 p.m. will not be returned until the following business day.  For prescription refill requests, have your pharmacy contact our office and allow 72  hours.    Cancer Center Support Programs:   > Cancer Support Group  2nd Tuesday of the month 1pm-2pm, Journey Room

## 2021-01-21 NOTE — Patient Instructions (Addendum)
[facility] Chemotherapy Teaching   You are diagnosed with Stage III right colon adenocarcinoma (cancer).  You will be treated in the clinic every 2 weeks for 6 months with a combination of chemotherapy drugs.  Those drugs are oxaliplatin, leucovorin (not chemo), and fluorouracil (5FU).  The intent of treatment is cure.  You will see the doctor regularly throughout treatment.  We will obtain blood work from you prior to every treatment and monitor your results to make sure it is safe to give your treatment. The doctor monitors your response to treatment by the way you are feeling, your blood work, and by obtaining scans periodically.  There will be wait times while you are here for treatment.  It will take about 30 minutes to 1 hour for your lab work to result.  Then there will be wait times while pharmacy mixes your medications.   Medications you will receive in the clinic prior to your chemotherapy medications:  Aloxi:  ALOXI is used in adults to help prevent nausea and vomiting that happens with certain chemotherapy drugs.  Aloxi is a long acting medication, and will remain in your system for about two days.   Dexamethasone:  This is a steroid given prior to chemotherapy to help prevent allergic reactions; it may also help prevent and control nausea and diarrhea.    Oxaliplatin (Eloxatin)  About This Drug  Oxaliplatin is used to treat cancer. It is given in the vein (IV).  It takes two hours to infuse.  Possible Side Effects  . Bone marrow suppression. This is a decrease in the number of white blood cells, red blood cells, and platelets. This may raise your risk of infection, make you tired and weak (fatigue), and raise your risk of bleeding.  . Tiredness  . Soreness of the mouth and throat. You may have red areas, white patches, or sores that hurt.  . Nausea and vomiting (throwing up)  . Diarrhea (loose bowel movements)  . Changes in your liver function  . Effects  on the nerves called peripheral neuropathy. You may feel numbness, tingling, or pain in your hands and feet, and may be worse in cold temperatures. It may be hard for you to button your clothes, open jars, or walk as usual. The effect on the nerves may get worse with more doses of the drug. These effects get better in some people after the drug is stopped but it does not get better in all people  Note: Each of the side effects above was reported in 40% or greater of patients treated with oxaliplatin. Not all possible side effects are included above.   Warnings and Precautions  . Allergic reactions, including anaphylaxis, which may be life-threatening are rare but may happen in some patients. Signs of allergic reaction to this drug may be swelling of the face, feeling like your tongue or throat are swelling, trouble breathing, rash, itching, fever, chills, feeling dizzy, and/or feeling that your heart is beating in a fast or not normal way. If this happens, do not take another dose of this drug. You should get urgent medical treatment.  . Inflammation (swelling) of the lungs, which may be life-threatening. You may have a dry cough or trouble breathing.  . Effects on the nerves (neuropathy) may resolve within 14 days, or it may persist beyond 14 days.  . Severe decrease in white blood cells when combined with the chemotherapy agents 5-fluorouracil and leucovorin. This may be life-threatening.  . Severe changes  in your liver function  . Abnormal heart beat and/or EKG, which can be life-threatening  . Rhabdomyolysis- damage to your muscles which may release proteins in your blood and affect how your kidneys work, which can be life-threatening. You may have severe muscle weakness and/or pain, or dark urine.  Important Information  . This drug may impair your ability to drive or use machinery. Talk to your doctor and/or nurse about precautions you may need to take.  . This drug may be present in  the saliva, tears, sweat, urine, stool, vomit, semen, and vaginal secretions. Talk to your doctor and/or your nurse about the necessary precautions to take during this time.  * The effects on the nerves can be aggravated by exposure to cold. Avoid cold beverages, use of ice, and make sure you cover your skin and dress warmly prior to being exposed to cold temperatures while you are receiving treatment with oxaliplatin*   Treating Side Effects  . Manage tiredness by pacing your activities for the day.  . Be sure to include periods of rest between energy-draining activities.  . To decrease the risk of infection, wash your hands regularly.  . Avoid close contact with people who have a cold, the flu, or other infections. . Take your temperature as your doctor or nurse tells you, and whenever you feel like you may have a fever.  . To help decrease the risk of bleeding, use a soft toothbrush. Check with your nurse before using dental floss.  . Be very careful when using knives or tools.  . Use an electric shaver instead of a razor.  . Drink plenty of fluids (a minimum of eight glasses per day is recommended).  . Mouth care is very important. Your mouth care should consist of routine, gentle cleaning of your teeth or dentures and rinsing your mouth with a mixture of 1/2 teaspoon of salt in 8 ounces of water or 1/2 teaspoon of baking soda in 8 ounces of water. This should be done at least after each meal and at bedtime.  . If you have mouth sores, avoid mouthwash that has alcohol. Also avoid alcohol and smoking because they can bother your mouth and throat.  . To help with nausea and vomiting, eat small, frequent meals instead of three large meals a day. Choose foods and drinks that are at room temperature. Ask your nurse or doctor about other helpful tips and medicine that is available to help stop or lessen these symptoms.  . If you throw up or have loose bowel movements, you should drink more  fluids so that you do not become dehydrated (lack of water in the body from losing too much fluid).  . If you have diarrhea, eat low-fiber foods that are high in protein and calories and avoid foods that can irritate your digestive tracts or lead to cramping.  . Ask your nurse or doctor about medicine that can lessen or stop your diarrhea.  . If you have numbness and tingling in your hands and feet, be careful when cooking, walking, and handling sharp objects and hot liquids.  . Do not drink cold drinks or use ice in beverages. Drink fluids at room temperature or warmer, and drink through a straw.  . Wear gloves to touch cold objects, and wear warm clothing and cover you skin during cold weather.   Food and Drug Interactions  . There are no known interactions of oxaliplatin with food and other medications.  . This drug may  interact with other medicines. Tell your doctor and pharmacist about all the prescription and over-the-counter medicines and dietary supplements (vitamins, minerals, herbs and others) that you are taking at this time. Also, check with your doctor or pharmacist before starting any new prescription or over-the-counter medicines, or dietary supplements to make sure that there are no interactions   When to Call the Doctor  Call your doctor or nurse if you have any of these symptoms and/or any new or unusual symptoms:  . Fever of 100.4 F (38 C) or higher  . Chills  . Tiredness that interferes with your daily activities  . Feeling dizzy or lightheaded  . Easy bleeding or bruising  . Feeling that your heart is beating in a fast or not normal way (palpitations)  . Pain in your chest  . Dry cough  . Trouble breathing  . Pain in your mouth or throat that makes it hard to eat or drink  . Nausea that stops you from eating or drinking and/or is not relieved by prescribed medicines  . Throwing up  . Diarrhea, 4 times in one day or diarrhea with lack of strength  or a feeling of being dizzy  . Numbness, tingling, or pain in your hands and feet  . Signs of possible liver problems: dark urine, pale bowel movements, bad stomach pain, feeling very tired and weak, unusual itching, or yellowing of the eyes or skin  . Signs of rhabdomyolysis: decreased urine, very dark urine, muscle pain in the shoulders, thighs, or lower back; muscle weakness or trouble moving arms and legs  . Signs of allergic reaction: swelling of the face, feeling like your tongue or throat are swelling, trouble breathing, rash, itching, fever, chills, feeling dizzy, and/or feeling that your heart is beating in a fast or not normal way. If this happens, call 911 for emergency care.  . If you think you may be pregnant  Reproduction Warnings  . Pregnancy warning: This drug may have harmful effects on the unborn baby. Women of childbearing potential should use effective methods of birth control during your cancer treatment. Let your doctor know right away if you think you may be pregnant or may have impregnated your partner.  . Breastfeeding warning: It is not known if this drug passes into breast milk. For this reason, women should talk to their doctor about the risks and benefits of breastfeeding during treatment with this drug because this drug may enter the breast milk and cause harm to a breastfeeding baby.  . Fertility warning: Human fertility studies have not been done with this drug. Talk with your doctor or nurse if you plan to have children. Ask for information on sperm or egg banking.   Leucovorin Calcium  About This Drug  Leucovorin is a vitamin. It is used in combination with other cancer fighting drugs such as 5-fluorouracil and methotrexate. Leucovorin is given in the vein (IV).  This drug runs at the same time as the oxaliplatin and takes 2 hours to infuse.   Possible Side Effects . Rash and itching  Note: Leucovorin by itself has very few side effects. Other side  effects you may have can be caused by the other drugs you are taking, such as 5-fluorouracil.   Warnings and Precautions  . Allergic reactions, including anaphylaxis are rare but may happen in some patients. Signs of allergic reaction to this drug may be swelling of the face, feeling like your tongue or throat are swelling, trouble breathing, rash, itching,  fever, chills, feeling dizzy, and/or feeling that your heart is beating in a fast or not normal way. If this happens, do not take another dose of this drug. You should get urgent medical treatment.  Food and Drug Interactions  . There are no known interactions of leucovorin with food.  . This drug may interact with other medicines. Tell your doctor and pharmacist about all the prescription and over-the-counter medicines and dietary supplements (vitamins, minerals, herbs and others) that you are taking at this time.  . Also, check with your doctor or pharmacist before starting any new prescription or over-the-counter medicines, or dietary supplements to make sure that there are no interactions.   When to Call the Doctor  Call your doctor or nurse if you have any of these symptoms and/or any new or unusual symptoms:  . A new rash or a rash that is not relieved by prescribed medicines  . Signs of allergic reaction: swelling of the face, feeling like your tongue or throat are swelling, trouble breathing, rash, itching, fever, chills, feeling dizzy, and/or feeling that your heart is beating in a fast or not normal way. If this happens, call 911 for emergency care.  . If you think you may be pregnant   Reproduction Warnings  . Pregnancy warning: It is not known if this drug may harm an unborn child. For this reason, be sure to talk with your doctor if you are pregnant or planning to become pregnant while receiving this drug. Let your doctor know right away if you think you may be pregnant  . Breastfeeding warning: It is not known if this  drug passes into breast milk. For this reason, women should talk to their doctor about the risks and benefits of breastfeeding during treatment with this drug because this drug may enter the breast milk and cause harm to a breastfeeding baby.  . Fertility warning: Human fertility studies have not been done with this drug. Talk with your doctor or nurse if you plan to have children. Ask for information on sperm or egg banking.   5-Fluorouracil (Adrucil; 5FU)  About This Drug  Fluorouracil is used to treat cancer. It is given in the vein (IV). It is given as an IV push from a syringe and also as a continuous infusion given via an ambulatory pump (a pump you take home and wear for a specified amount of time).  Possible Side Effects  . Bone marrow suppression. This is a decrease in the number of white blood cells, red blood cells, and platelets. This may raise your risk of infection, make you tired and weak (fatigue), and raise your risk of bleeding  . Changes in the tissue of the heart and/or heart attack. Some changes may happen that can cause your heart to have less ability to pump blood.  . Blurred vision or other changes in eyesight  . Nausea and throwing up (vomiting)  . Diarrhea (loose bowel movements)  . Ulcers - sores that may cause pain or bleeding in your digestive tract, which includes your mouth, esophagus, stomach, small/large intestines and rectum  . Soreness of the mouth and throat. You may have red areas, white patches, or sores that hurt.  . Allergic reactions, including anaphylaxis are rare but may happen in some patients. Signs of allergic reaction to this drug may be swelling of the face, feeling like your tongue or throat are swelling, trouble breathing, rash, itching, fever, chills, feeling dizzy, and/or feeling that your heart is beating  in a fast or not normal way. If this happens, do not take another dose of this drug. You should get urgent medical treatment.  .  Sensitivity to light (photosensitivity). Photosensitivity means that you may become more sensitive to the sun and/or light. You may get a skin rash/reaction if you are in the sun or are exposed to sun lamps and tanning beds. Your eyes may water more, mostly in bright light.  . Changes in your nail color, nail loss and/or brittle nail  . Darkening of the skin, or changes to the color of your skin and/or veins used for infusion  . Rash, dry skin, or itching  Note: Not all possible side effects are included above.  Warnings and Precautions  . Hand-and-foot syndrome. The palms of your hands or soles of your feet may tingle, become numb, painful, swollen, or red.  . Changes in your central nervous system can happen. The central nervous system is made up of your brain and spinal cord. You could feel extreme tiredness, agitation, confusion, hallucinations (see or hear things that are not there), trouble understanding or speaking, loss of control of your bowels or bladder, eyesight changes, numbness or lack of strength to your arms, legs, face, or body, or coma. If you start to have any of these symptoms let your doctor know right away.  . Side effects of this drug may be unexpectedly severe in some patients  Note: Some of the side effects above are very rare. If you have concerns and/or questions, please discuss them with your medical team.   Important Information  . This drug may be present in the saliva, tears, sweat, urine, stool, vomit, semen, and vaginal secretions. Talk to your doctor and/or your nurse about the necessary precautions to take during this time.   Treating Side Effects  . Manage tiredness by pacing your activities for the day.  . Be sure to include periods of rest between energy-draining activities.  . To help decrease the risk of infections, wash your hands regularly.  . Avoid close contact with people who have a cold, the flu, or other infections.  . Take your  temperature as your doctor or nurse tells you, and whenever you feel like you may have a fever.  . Use a soft toothbrush. Check with your nurse before using dental floss.  . Be very careful when using knives or tools.  . Use an electric shaver instead of a razor.  . If you have a nose bleed, sit with your head tipped slightly forward. Apply pressure by lightly pinching the bridge of your nose between your thumb and forefinger. Call your doctor if you feel dizzy or faint or if the bleeding doesn't stop after 10 to 15 minutes.  . Drink plenty of fluids (a minimum of eight glasses per day is recommended).  . If you throw up or have loose bowel movements, you should drink more fluids so that you do not  become dehydrated (lack of water in the body from losing too much fluid).  . To help with nausea and vomiting, eat small, frequent meals instead of three large meals a day. Choose foods and drinks that are at room temperature. Ask your nurse or doctor about other helpful tips and medicine that is available to help, stop, or lessen these symptoms.  . If you have diarrhea, eat low-fiber foods that are high in protein and calories and avoid foods that can irritate your digestive tracts or lead to cramping.  Marland Kitchen  Ask your nurse or doctor about medicine that can lessen or stop your diarrhea.  . Mouth care is very important. Your mouth care should consist of routine, gentle cleaning of your teeth or dentures and rinsing your mouth with a mixture of 1/2 teaspoon of salt in 8 ounces of water or 1/2 teaspoon of baking soda in 8 ounces of water. This should be done at least after each meal and at bedtime.  . If you have mouth sores, avoid mouthwash that has alcohol. Also avoid alcohol and smoking because they can bother your mouth and throat.  Marland Kitchen Keeping your nails moisturized may help with brittleness.  . To help with itching, moisturize your skin several times day.  . Use sunscreen with SPF 30 or higher  when you are outdoors even for a short time. Cover up when you are out in the sun. Wear wide-brimmed hats, long-sleeved shirts, and pants. Keep your neck, chest, and back covered. Wear dark sun glasses when in the sun or bright lights.  . If you get a rash do not put anything on it unless your doctor or nurse says you may. Keep the area around the rash clean and dry. Ask your doctor for medicine if your rash bothers you.  Marland Kitchen Keeping your pain under control is important to your well-being. Please tell your doctor or nurse if you are experiencing pain.   Food and Drug Interactions  . There are no known interactions of fluorouracil with food.  . Check with your doctor or pharmacist about all other prescription medicines and over-the-counter medicines and dietary supplements (vitamins, minerals, herbs and others) you are taking before starting this medicine as there are known drug interactions with 5-fluoroucacil. Also, check with your doctor or pharmacist before starting any new prescription or over-the-counter medicines, or dietary supplements to make sure that there are no interactions.  When to Call the Doctor  Call your doctor or nurse if you have any of these symptoms and/or any new or unusual symptoms:  . Fever of 100.4 F (38 C) or higher  . Chills  . Easy bleeding or bruising  . Nose bleed that doesn't stop bleeding after 10-15 minutes  . Trouble breathing  . Feeling dizzy or lightheaded  . Feeling that your heart is beating in a fast or not normal way (palpitations)  . Chest pain or symptoms of a heart attack. Most heart attacks involve pain in the center of the chest that lasts more than a few minutes. The pain may go away and come back or it can be constant. It can feel like pressure, squeezing, fullness, or pain. Sometimes pain is felt in one or both arms, the back, neck, jaw, or stomach. If any of these symptoms last 2 minutes, call 911.  Marland Kitchen Confusion and/or agitation  .  Hallucinations  . Trouble understanding or speaking  . Loss of control of bowels or bladder  . Blurry vision or changes in your eyesight  . Headache that does not go away  . Numbness or lack of strength to your arms, legs, face, or body  . Nausea that stops you from eating or drinking and/or is not relieved by prescribed medicines  . Throwing up more than 3 times a day  . Diarrhea, 4 times in one day or diarrhea with lack of strength or a feeling of being dizzy  . Pain in your mouth or throat that makes it hard to eat or drink  . Pain along the digestive  tract - especially if worse after eating  . Blood in your vomit (bright red or coffee-ground) and/or stools (bright red, or black/tarry)  . Coughing up blood  . Tiredness that interferes with your daily activities  . Painful, red, or swollen areas on your hands or feet or around your nails  . A new rash or a rash that is not relieved by prescribed medicines  . Develop sensitivity to sunlight/light  . Numbness and/or tingling of your hands and/or feet  . Signs of allergic reaction: swelling of the face, feeling like your tongue or throat are swelling, trouble breathing, rash, itching, fever, chills, feeling dizzy, and/or feeling that your heart is beating in a fast or not normal way. If this happens, call 911 for emergency care.  . If you think you are pregnant or may have impregnated your partner  Reproduction Warnings  . Pregnancy warning: This drug may have harmful effects on the unborn baby. Women of child bearing potential should use effective methods of birth control during your cancer treatment and 3 months after treatment. Men with male partners of childbearing potential should use effective methods of birth control during your cancer treatment and for 3 months after your cancer treatment. Let your doctor know right away if you think you may be pregnant or may have impregnated your partner.  . Breastfeeding  warning: It is not known if this drug passes into breast milk. For this reason, Women should not breastfeed during treatment because this drug could enter the breast milk and cause harm to a breastfeeding baby.  . Fertility warning: In men and women both, this drug may affect your ability to have children in the future. Talk with your doctor or nurse if you plan to have children. Ask for information on sperm or egg banking.   SELF CARE ACTIVITIES WHILE RECEIVING CHEMOTHERAPY:  Hydration Increase your fluid intake 48 hours prior to treatment and drink at least 8 to 12 cups (64 ounces) of water/decaffeinated beverages per day after treatment. You can still have your cup of coffee or soda but these beverages do not count as part of your 8 to 12 cups that you need to drink daily. No alcohol intake.  Medications Continue taking your normal prescription medication as prescribed.  If you start any new herbal or new supplements please let us know first to make sure it is safe.  Mouth Care Have teeth cleaned professionally before starting treatment. Keep dentures and partial plates clean. Use soft toothbrush and do not use mouthwashes that contain alcohol. Biotene is a good mouthwash that is available at most pharmacies or may be ordered by calling [phone]. Use warm salt water gargles (1 teaspoon salt per 1 quart warm water) before and after meals and at bedtime. If you need dental work, please let the doctor know before you go for your appointment so that we can coordinate the best possible time for you in regards to your chemo regimen. You need to also let your dentist know that you are actively taking chemo. We may need to do labs prior to your dental appointment.  Skin Care Always use sunscreen that has not expired and with SPF (Sun Protection Factor) of 50 or higher. Wear hats to protect your head from the sun. Remember to use sunscreen on your hands, ears, face, & feet.  Use good moisturizing  lotions such as udder cream, eucerin, or even Vaseline. Some chemotherapies can cause dry skin, color changes in your skin and  nails.    . Avoid long, hot showers or baths. . Use gentle, fragrance-free soaps and laundry detergent. . Use moisturizers, preferably creams or ointments rather than lotions because the thicker consistency is better at preventing skin dehydration. Apply the cream or ointment within 15 minutes of showering. Reapply moisturizer at night, and moisturize your hands every time after you wash them.  Hair Loss (if your doctor says your hair will fall out)  . If your doctor says that your hair is likely to fall out, decide before you begin chemo whether you want to wear a wig. You may want to shop before treatment to match your hair color. . Hats, turbans, and scarves can also camouflage hair loss, although some people prefer to leave their heads uncovered. If you go bare-headed outdoors, be sure to use sunscreen on your scalp. . Cut your hair short. It eases the inconvenience of shedding lots of hair, but it also can reduce the emotional impact of watching your hair fall out. . Don't perm or color your hair during chemotherapy. Those chemical treatments are already damaging to hair and can enhance hair loss. Once your chemo treatments are done and your hair has grown back, it's OK to resume dyeing or perming hair.  With chemotherapy, hair loss is almost always temporary. But when it grows back, it may be a different color or texture. In older adults who still had hair color before chemotherapy, the new growth may be completely gray.  Often, new hair is very fine and soft.  Infection Prevention Please wash your hands for at least 30 seconds using warm soapy water. Handwashing is the #1 way to prevent the spread of germs. Stay away from sick people or people who are getting over a cold. If you develop respiratory systems such as green/yellow mucus production or productive cough or  persistent cough let us know and we will see if you need an antibiotic. It is a good idea to keep a pair of gloves on when going into grocery stores/[employer] to decrease your risk of coming into contact with germs on the carts, etc. Carry alcohol hand gel with you at all times and use it frequently if out in public. If your temperature reaches 100.5 or higher please call the clinic and let us know.  If it is after hours or on the weekend please go to the ER if your temperature is over 100.5.  Please have your own personal thermometer at home to use.    Sex and bodily fluids If you are going to have sex, a condom must be used to protect the person that isn't taking chemotherapy. Chemo can decrease your libido (sex drive). For a few days after chemotherapy, chemotherapy can be excreted through your bodily fluids.  When using the toilet please close the lid and flush the toilet twice.  Do this for a few day after you have had chemotherapy.   Effects of chemotherapy on your sex life Some changes are simple and won't last long. They won't affect your sex life permanently.  Sometimes you may feel: . too tired . not strong enough to be very active . sick or sore  . not in the mood . anxious or low Your anxiety might not seem related to sex. For example, you may be worried about the cancer and how your treatment is going. Or you may be worried about money, or about how you family are coping with your illness.  These things can cause  stress, which can affect your interest in sex. It's important to talk to your partner about how you feel.  Remember - the changes to your sex life don't usually last long. There's usually no medical reason to stop having sex during chemo. The drugs won't have any long term physical effects on your performance or enjoyment of sex. Cancer can't be passed on to your partner during sex  Contraception It's important to use reliable contraception during treatment. Avoid getting  pregnant while you or your partner are having chemotherapy. This is because the drugs may harm the baby. Sometimes chemotherapy drugs can leave a man or woman infertile.  This means you would not be able to have children in the future. You might want to talk to someone about permanent infertility. It can be very difficult to learn that you may no longer be able to have children. Some people find counselling helpful. There might be ways to preserve your fertility, although this is easier for men than for women. You may want to speak to a fertility expert. You can talk about sperm banking or harvesting your eggs. You can also ask about other fertility options, such as donor eggs. If you have or have had breast cancer, your doctor might advise you not to take the contraceptive pill. This is because the hormones in it might affect the cancer. It is not known for sure whether or not chemotherapy drugs can be passed on through semen or secretions from the vagina. Because of this some doctors advise people to use a barrier method if you have sex during treatment. This applies to vaginal, anal or oral sex. Generally, doctors advise a barrier method only for the time you are actually having the treatment and for about a week after your treatment. Advice like this can be worrying, but this does not mean that you have to avoid being intimate with your partner. You can still have close contact with your partner and continue to enjoy sex.  Animals If you have cats or birds we just ask that you not change the litter or change the cage.  Please have someone else do this for you while you are on chemotherapy.   Food Safety During and After Cancer Treatment Food safety is important for people both during and after cancer treatment. Cancer and cancer treatments, such as chemotherapy, radiation therapy, and stem cell/bone marrow transplantation, often weaken the immune system. This makes it harder for your body to protect itself  from foodborne illness, also called food poisoning. Foodborne illness is caused by eating food that contains harmful bacteria, parasites, or viruses.  Foods to avoid Some foods have a higher risk of becoming tainted with bacteria. These include: Marland Kitchen Unwashed fresh fruit and vegetables, especially leafy vegetables that can hide dirt and other contaminants . Raw sprouts, such as alfalfa sprouts . Raw or undercooked beef, especially ground beef, or other raw or undercooked meat and poultry . Fatty, fried, or spicy foods immediately before or after treatment.  These can sit heavy on your stomach and make you feel nauseous. . Raw or undercooked shellfish, such as oysters. . Sushi and sashimi, which often contain raw fish.  . Unpasteurized beverages, such as unpasteurized fruit juices, raw milk, raw yogurt, or cider . Undercooked eggs, such as soft boiled, over easy, and poached; raw, unpasteurized eggs; or foods made with raw egg, such as homemade raw cookie dough and homemade mayonnaise  Simple steps for food safety  Shop smart. . Do  not buy food stored or displayed in an unclean area. . Do not buy bruised or damaged fruits or vegetables. . Do not buy cans that have cracks, dents, or bulges. . Pick up foods that can spoil at the end of your shopping trip and store them in a cooler on the way home.  Prepare and clean up foods carefully. . Rinse all fresh fruits and vegetables under running water, and dry them with a clean towel or paper towel. . Clean the top of cans before opening them. . After preparing food, wash your hands for 20 seconds with hot water and soap. Pay special attention to areas between fingers and under nails. . Clean your utensils and dishes with hot water and soap. Marland Kitchen Disinfect your kitchen and cutting boards using 1 teaspoon of liquid, unscented bleach mixed into 1 quart of water.    Dispose of old food. . Eat canned and packaged food before its expiration date (the "use  by" or "best before" date). . Consume refrigerated leftovers within 3 to 4 days. After that time, throw out the food. Even if the food does not smell or look spoiled, it still may be unsafe. Some bacteria, such as Listeria, can grow even on foods stored in the refrigerator if they are kept for too long.  Take precautions when eating out. . At restaurants, avoid buffets and salad bars where food sits out for a long time and comes in contact with many people. Food can become contaminated when someone with a virus, often a norovirus, or another "bug" handles it. . Put any leftover food in a "to-go" container yourself, rather than having the server do it. And, refrigerate leftovers as soon as you get home. . Choose restaurants that are clean and that are willing to prepare your food as you order it cooked.   AT HOME MEDICATIONS:                                                                                                                                                                Compazine/Prochlorperazine 10mg  tablet. Take 1 tablet every 6 hours as needed for nausea/vomiting. (This can make you sleepy)   EMLA cream. Apply a quarter size amount to port site 1 hour prior to chemo. Do not rub in. Cover with plastic wrap.    Diarrhea Sheet   If you are having loose stools/diarrhea, please purchase Imodium and begin taking as outlined:  At the first sign of poorly formed or loose stools you should begin taking Imodium (loperamide) 2 mg capsules.  Take two tablets (4mg ) followed by one tablet (2mg ) every 2 hours - DO NOT EXCEED 8 tablets in 24 hours.  If it is bedtime and you are having loose stools, take 2 tablets at  bedtime, then 2 tablets every 4 hours until morning.   Always call the [facility] if you are having loose stools/diarrhea that you can't get under control.  Loose stools/diarrhea leads to dehydration (loss of water) in your body.  We have other options of trying to get the loose  stools/diarrhea to stop but you must let us know!   Constipation Sheet  Colace - 100 mg capsules - take 2 capsules daily.  If this doesn't help then you can increase to 2 capsules twice daily.  Please call if the above does not work for you. Do not go more than 2 days without a bowel movement.  It is very important that you do not become constipated.  It will make you feel sick to your stomach (nausea) and can cause abdominal pain and vomiting.  Nausea Sheet   Compazine/Prochlorperazine 10mg  tablet. Take 1 tablet every 6 hours as needed for nausea/vomiting (This can make you drowsy).  If you are having persistent nausea (nausea that does not stop) please call the [facility] and let us know the amount of nausea that you are experiencing.  If you begin to vomit, you need to call the [facility] and if it is the weekend and you have vomited more than one time and can't get it to stop-go to the Emergency Room.  Persistent nausea/vomiting can lead to dehydration (loss of fluid in your body) and will make you feel very weak and unwell. Ice chips, sips of clear liquids, foods that are at room temperature, crackers, and toast tend to be better tolerated.   SYMPTOMS TO REPORT AS SOON AS POSSIBLE AFTER TREATMENT:  FEVER GREATER THAN 100.4 F  CHILLS WITH OR WITHOUT FEVER  NAUSEA AND VOMITING THAT IS NOT CONTROLLED WITH YOUR NAUSEA MEDICATION  UNUSUAL SHORTNESS OF BREATH  UNUSUAL BRUISING OR BLEEDING  TENDERNESS IN MOUTH AND THROAT WITH OR WITHOUT   PRESENCE OF ULCERS  URINARY PROBLEMS  BOWEL PROBLEMS  UNUSUAL RASH      Wear comfortable clothing and clothing appropriate for easy access to any Portacath or PICC line. Let us know if there is anything that we can do to make your therapy better!    What to do if you need assistance after hours or on the weekends: CALL [phone].  HOLD on the line, do not hang up.  You will hear multiple messages but at the end you will be  connected with a nurse triage line.  They will contact the doctor if necessary.  Most of the time they will be able to assist you.  Do not call the hospital operator.      I have been informed and understand all of the instructions given to me and have received a copy. I have been instructed to call the clinic [phone] or my family physician as soon as possible for continued medical care, if indicated. I do not have any more questions at this time but understand that I may call the [facility] or the Patient Navigator at [phone] during office hours should I have questions or need assistance in obtaining follow-up care.

## 2021-01-24 ENCOUNTER — Encounter (HOSPITAL_COMMUNITY): Payer: Self-pay

## 2021-01-24 DIAGNOSIS — Z95828 Presence of other vascular implants and grafts: Secondary | ICD-10-CM

## 2021-01-24 HISTORY — DX: Presence of other vascular implants and grafts: Z95.828

## 2021-01-24 MED ORDER — PROCHLORPERAZINE MALEATE 10 MG PO TABS: 10.0000 mg | ORAL_TABLET | Freq: Four times a day (QID) | ORAL | 1 refills | Status: DC | PRN

## 2021-01-24 MED ORDER — LIDOCAINE-PRILOCAINE 2.5-2.5 % EX CREA: TOPICAL_CREAM | CUTANEOUS | 3 refills | Status: DC

## 2021-01-25 ENCOUNTER — Inpatient Hospital Stay (HOSPITAL_COMMUNITY): Payer: BLUE CROSS/BLUE SHIELD

## 2021-01-25 ENCOUNTER — Other Ambulatory Visit: Payer: Self-pay

## 2021-01-25 ENCOUNTER — Other Ambulatory Visit (HOSPITAL_COMMUNITY): Payer: BLUE CROSS/BLUE SHIELD | Admitting: General Practice

## 2021-01-25 VITALS — BP 136/78 | HR 73 | Temp 96.8°F | Resp 18

## 2021-01-25 DIAGNOSIS — G479 Sleep disorder, unspecified: Secondary | ICD-10-CM | POA: Diagnosis not present

## 2021-01-25 DIAGNOSIS — Z8269 Family history of other diseases of the musculoskeletal system and connective tissue: Secondary | ICD-10-CM | POA: Diagnosis not present

## 2021-01-25 DIAGNOSIS — Z79899 Other long term (current) drug therapy: Secondary | ICD-10-CM | POA: Diagnosis not present

## 2021-01-25 DIAGNOSIS — Z95828 Presence of other vascular implants and grafts: Secondary | ICD-10-CM

## 2021-01-25 DIAGNOSIS — I700 Atherosclerosis of aorta: Secondary | ICD-10-CM | POA: Diagnosis not present

## 2021-01-25 DIAGNOSIS — C182 Malignant neoplasm of ascending colon: Secondary | ICD-10-CM

## 2021-01-25 DIAGNOSIS — Z8249 Family history of ischemic heart disease and other diseases of the circulatory system: Secondary | ICD-10-CM | POA: Diagnosis not present

## 2021-01-25 DIAGNOSIS — R519 Headache, unspecified: Secondary | ICD-10-CM | POA: Diagnosis not present

## 2021-01-25 DIAGNOSIS — Z5111 Encounter for antineoplastic chemotherapy: Secondary | ICD-10-CM | POA: Diagnosis not present

## 2021-01-25 DIAGNOSIS — Z8261 Family history of arthritis: Secondary | ICD-10-CM | POA: Diagnosis not present

## 2021-01-25 LAB — COMPREHENSIVE METABOLIC PANEL
ALT: 19 U/L (ref 0–44)
AST: 18 U/L (ref 15–41)
Albumin: 3.9 g/dL (ref 3.5–5.0)
Alkaline Phosphatase: 82 U/L (ref 38–126)
Anion gap: 8 (ref 5–15)
BUN: 12 mg/dL (ref 6–20)
CO2: 24 mmol/L (ref 22–32)
Calcium: 9 mg/dL (ref 8.9–10.3)
Chloride: 106 mmol/L (ref 98–111)
Creatinine, Ser: 0.91 mg/dL (ref 0.61–1.24)
GFR, Estimated: >60 mL/min (ref >60)
Glucose, Bld: 101 mg/dL — ABNORMAL HIGH (ref 70–99)
Potassium: 4 mmol/L (ref 3.5–5.1)
Sodium: 138 mmol/L (ref 135–145)
Total Bilirubin: 0.8 mg/dL (ref 0.3–1.2)
Total Protein: 7 g/dL (ref 6.5–8.1)

## 2021-01-25 LAB — CBC WITH DIFFERENTIAL/PLATELET
Abs Immature Granulocytes: 0.02 10*3/uL (ref 0.00–0.07)
Basophils Absolute: 0 10*3/uL (ref 0.0–0.1)
Basophils Relative: 1 %
Eosinophils Absolute: 0.3 10*3/uL (ref 0.0–0.5)
Eosinophils Relative: 7 %
HCT: 47.4 % (ref 39.0–52.0)
Hemoglobin: 15.1 g/dL (ref 13.0–17.0)
Immature Granulocytes: 1 %
Lymphocytes Relative: 33 %
Lymphs Abs: 1.4 10*3/uL (ref 0.7–4.0)
MCH: 28.9 pg (ref 26.0–34.0)
MCHC: 31.9 g/dL (ref 30.0–36.0)
MCV: 90.6 fL (ref 80.0–100.0)
Monocytes Absolute: 0.4 10*3/uL (ref 0.1–1.0)
Monocytes Relative: 9 %
Neutro Abs: 2.1 10*3/uL (ref 1.7–7.7)
Neutrophils Relative %: 49 %
Platelets: 218 10*3/uL (ref 150–400)
RBC: 5.23 MIL/uL (ref 4.22–5.81)
RDW: 16.2 % — ABNORMAL HIGH (ref 11.5–15.5)
WBC: 4.3 10*3/uL (ref 4.0–10.5)
nRBC: 0 % (ref 0.0–0.2)

## 2021-01-25 LAB — MAGNESIUM: Magnesium: 1.9 mg/dL (ref 1.7–2.4)

## 2021-01-25 MED ORDER — DEXTROSE 5 % IV SOLN: Freq: Once | INTRAVENOUS | Status: AC

## 2021-01-25 MED ORDER — PALONOSETRON HCL INJECTION 0.25 MG/5ML
0.2500 mg | Freq: Once | INTRAVENOUS | Status: AC
  Administered 2021-01-25: 0.25 mg via INTRAVENOUS

## 2021-01-25 MED ORDER — SODIUM CHLORIDE 0.9 % IV SOLN
10.0000 mg | Freq: Once | INTRAVENOUS | Status: AC
  Administered 2021-01-25: 10 mg via INTRAVENOUS
  Filled 2021-01-25: qty 10

## 2021-01-25 MED ORDER — PALONOSETRON HCL INJECTION 0.25 MG/5ML
INTRAVENOUS | Status: AC
  Filled 2021-01-25: qty 5

## 2021-01-25 MED ORDER — SODIUM CHLORIDE 0.9 % IV SOLN
1920.0000 mg/m2 | INTRAVENOUS | Status: DC
  Administered 2021-01-25: 4300 mg via INTRAVENOUS
  Filled 2021-01-25: qty 86

## 2021-01-25 MED ORDER — LEUCOVORIN CALCIUM INJECTION 350 MG
320.0000 mg/m2 | Freq: Once | INTRAVENOUS | Status: AC
  Administered 2021-01-25: 714 mg via INTRAVENOUS
  Filled 2021-01-25: qty 35.7

## 2021-01-25 MED ORDER — SODIUM CHLORIDE 0.9% FLUSH
10.0000 mL | INTRAVENOUS | Status: DC | PRN
  Administered 2021-01-25: 10 mL via INTRAVENOUS

## 2021-01-25 MED ORDER — FLUOROURACIL CHEMO INJECTION 2.5 GM/50ML
320.0000 mg/m2 | Freq: Once | INTRAVENOUS | Status: AC
  Administered 2021-01-25: 700 mg via INTRAVENOUS
  Filled 2021-01-25: qty 14

## 2021-01-25 MED ORDER — OXALIPLATIN CHEMO INJECTION 100 MG/20ML
68.0000 mg/m2 | Freq: Once | INTRAVENOUS | Status: AC
  Administered 2021-01-25: 150 mg via INTRAVENOUS
  Filled 2021-01-25: qty 20

## 2021-01-25 NOTE — Progress Notes (Unsigned)

## 2021-01-25 NOTE — Progress Notes (Signed)
Patient presents today for D1C1 Folfox . Vital signs within parameters for treatment. Labs pending. Patient has no complaints of any changes since his last visit. MAR reviewed and updated. Anastasio Champion RN at the bedside and teaching performed. Consent obtained.   Labs within parameters for treatment.   Patient viewed I-pad video and consent obtained.   Treatment given today per MD orders. Tolerated infusion without adverse affects. Vital signs stable. No complaints at this time. RUN noted on screen. Verified with patient.  Discharged from clinic ambulatory in stable condition. Alert and oriented x 3. F/U with [facility] as scheduled.

## 2021-01-25 NOTE — Patient Instructions (Signed)
[facility] Discharge Instructions for Patients Receiving Chemotherapy  Today you received the following chemotherapy agents Folfox.   To help prevent nausea and vomiting after your treatment, we encourage you to take your nausea medication .    If you develop nausea and vomiting that is not controlled by your nausea medication, call the clinic.   BELOW ARE SYMPTOMS THAT SHOULD BE REPORTED IMMEDIATELY:  *FEVER GREATER THAN 100.5 F  *CHILLS WITH OR WITHOUT FEVER  NAUSEA AND VOMITING THAT IS NOT CONTROLLED WITH YOUR NAUSEA MEDICATION  *UNUSUAL SHORTNESS OF BREATH  *UNUSUAL BRUISING OR BLEEDING  TENDERNESS IN MOUTH AND THROAT WITH OR WITHOUT PRESENCE OF ULCERS  *URINARY PROBLEMS  *BOWEL PROBLEMS  UNUSUAL RASH Items with * indicate a potential emergency and should be followed up as soon as possible.  Feel free to call the clinic should you have any questions or concerns. The clinic phone number is [phone].  Please show the [facility] at check-in to the Emergency Department and triage nurse.

## 2021-01-25 NOTE — Progress Notes (Signed)
Patients port flushed without difficulty.  Good blood return noted with no bruising or swelling noted at site.  Band aid applied.  VSS stable and patient in favorable condition.

## 2021-01-26 ENCOUNTER — Encounter: Payer: Self-pay | Admitting: Neurology

## 2021-01-26 ENCOUNTER — Ambulatory Visit (INDEPENDENT_AMBULATORY_CARE_PROVIDER_SITE_OTHER): Payer: BLUE CROSS/BLUE SHIELD | Admitting: Neurology

## 2021-01-26 VITALS — BP 118/78 | HR 108 | Ht 68.0 in | Wt 231.6 lb

## 2021-01-26 DIAGNOSIS — G40009 Localization-related (focal) (partial) idiopathic epilepsy and epileptic syndromes with seizures of localized onset, not intractable, without status epilepticus: Secondary | ICD-10-CM

## 2021-01-26 MED ORDER — LAMOTRIGINE ER 300 MG PO TB24: ORAL_TABLET | ORAL | 3 refills | Status: DC

## 2021-01-26 MED ORDER — LEVETIRACETAM ER 500 MG PO TB24: 1000.0000 mg | ORAL_TABLET | Freq: Every day | ORAL | 3 refills | Status: DC

## 2021-01-26 NOTE — Patient Instructions (Signed)
Good to see you! Continue all your medications. Wishing you well with the chemotherapy. Follow-up in 3-4 months, call for any changes   Seizure Precautions: 1. If medication has been prescribed for you to prevent seizures, take it exactly as directed.  Do not stop taking the medicine without talking to your doctor first, even if you have not had a seizure in a long time.   2. Avoid activities in which a seizure would cause danger to yourself or to others.  Don't operate dangerous machinery, swim alone, or climb in high or dangerous places, such as on ladders, roofs, or girders.  Do not drive unless your doctor says you may.  3. If you have any warning that you may have a seizure, lay down in a safe place where you can't hurt yourself.    4.  No driving for 6 months from last seizure, as per [facility]   Please refer to the following link on the [place] website for more information: [email or web address]   5.  Maintain good sleep hygiene. Avoid alcohol  6.  Contact your doctor if you have any problems that may be related to the medicine you are taking.  7.  Call 911 and bring the patient back to the ED if:        A.  The seizure lasts longer than 5 minutes.       B.  The patient doesn't awaken shortly after the seizure  C.  The patient has new problems such as difficulty seeing, speaking or moving  D.  The patient was injured during the seizure  E.  The patient has a temperature over 102 F (39C)  F.  The patient vomited and now is having trouble breathing
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
## 2021-01-27 ENCOUNTER — Encounter (HOSPITAL_COMMUNITY): Payer: Self-pay

## 2021-01-27 ENCOUNTER — Inpatient Hospital Stay (HOSPITAL_COMMUNITY): Payer: BLUE CROSS/BLUE SHIELD

## 2021-01-27 ENCOUNTER — Other Ambulatory Visit: Payer: Self-pay

## 2021-01-27 VITALS — BP 123/76 | HR 96 | Temp 97.2°F | Resp 20

## 2021-01-27 DIAGNOSIS — Z79899 Other long term (current) drug therapy: Secondary | ICD-10-CM | POA: Diagnosis not present

## 2021-01-27 DIAGNOSIS — G479 Sleep disorder, unspecified: Secondary | ICD-10-CM | POA: Diagnosis not present

## 2021-01-27 DIAGNOSIS — I700 Atherosclerosis of aorta: Secondary | ICD-10-CM | POA: Diagnosis not present

## 2021-01-27 DIAGNOSIS — Z8249 Family history of ischemic heart disease and other diseases of the circulatory system: Secondary | ICD-10-CM | POA: Diagnosis not present

## 2021-01-27 DIAGNOSIS — Z95828 Presence of other vascular implants and grafts: Secondary | ICD-10-CM

## 2021-01-27 DIAGNOSIS — Z8269 Family history of other diseases of the musculoskeletal system and connective tissue: Secondary | ICD-10-CM | POA: Diagnosis not present

## 2021-01-27 DIAGNOSIS — Z5111 Encounter for antineoplastic chemotherapy: Secondary | ICD-10-CM | POA: Diagnosis not present

## 2021-01-27 DIAGNOSIS — C182 Malignant neoplasm of ascending colon: Secondary | ICD-10-CM | POA: Diagnosis not present

## 2021-01-27 DIAGNOSIS — Z8261 Family history of arthritis: Secondary | ICD-10-CM | POA: Diagnosis not present

## 2021-01-27 DIAGNOSIS — R519 Headache, unspecified: Secondary | ICD-10-CM | POA: Diagnosis not present

## 2021-01-27 MED ORDER — SODIUM CHLORIDE 0.9% FLUSH
10.0000 mL | INTRAVENOUS | Status: DC | PRN
  Administered 2021-01-27: 10 mL

## 2021-01-27 MED ORDER — HEPARIN SOD (PORK) LOCK FLUSH 100 UNIT/ML IV SOLN
500.0000 [IU] | Freq: Once | INTRAVENOUS | Status: AC | PRN
  Administered 2021-01-27: 500 [IU]

## 2021-01-27 NOTE — Patient Instructions (Signed)
[place] at [facility]  Discharge Instructions:  Chemotherapy pump disconnected. Returned as scheduled. _______________________________________________________________  Thank you for choosing [place] at [facility] to provide your oncology and hematology care.  To afford each patient quality time with our providers, please arrive at least 15 minutes before your scheduled appointment.  You need to re-schedule your appointment if you arrive 10 or more minutes late.  We strive to give you quality time with our providers, and arriving late affects you and other patients whose appointments are after yours.  Also, if you no show three or more times for appointments you may be dismissed from the clinic.  Again, thank you for choosing [place] at [place] hope is that these requests will allow you access to exceptional care and in a timely manner. _______________________________________________________________  If you have questions after your visit, please contact our office at [phone] between the hours of 8:30 a.m. and 5:00 p.m. Voicemails left after 4:30 p.m. will not be returned until the following business day. _______________________________________________________________  For prescription refill requests, have your pharmacy contact our office. _______________________________________________________________  Recommendations made by the consultant and any test results will be sent to your referring physician. _______________________________________________________________

## 2021-01-27 NOTE — Progress Notes (Signed)
Chemotherapy pump disconnected. Port flushed with no blood return noted. No complaints of pain during flush, flushed easily. No bruising or swelling at site. Bandaid applied and patient discharged in satisfactory condition. VVS stable with no signs or symptoms of distressed noted.

## 2021-01-28 ENCOUNTER — Telehealth: Payer: Self-pay

## 2021-01-28 DIAGNOSIS — I10 Essential (primary) hypertension: Secondary | ICD-10-CM

## 2021-01-28 MED ORDER — LISINOPRIL 10 MG PO TABS: 10.0000 mg | ORAL_TABLET | Freq: Every day | ORAL | 0 refills | Status: DC

## 2021-01-28 NOTE — Telephone Encounter (Signed)
[facility], patient aware

## 2021-01-28 NOTE — Telephone Encounter (Signed)
  Prescription Request  01/28/2021  What is the name of the medication or equipment? lisinopril (ZESTRIL) 10 MG tablet  Have you contacted your pharmacy to request a refill? (if applicable) no  Which [facility] [place]   Patient notified that their request is being sent to the clinical staff for review and that they should receive a response within 2 business days.

## 2021-02-01 ENCOUNTER — Inpatient Hospital Stay (HOSPITAL_COMMUNITY): Payer: BLUE CROSS/BLUE SHIELD | Attending: Hematology | Admitting: General Practice

## 2021-02-01 DIAGNOSIS — R197 Diarrhea, unspecified: Secondary | ICD-10-CM | POA: Insufficient documentation

## 2021-02-01 DIAGNOSIS — Z8269 Family history of other diseases of the musculoskeletal system and connective tissue: Secondary | ICD-10-CM | POA: Insufficient documentation

## 2021-02-01 DIAGNOSIS — E669 Obesity, unspecified: Secondary | ICD-10-CM | POA: Insufficient documentation

## 2021-02-01 DIAGNOSIS — Z832 Family history of diseases of the blood and blood-forming organs and certain disorders involving the immune mechanism: Secondary | ICD-10-CM | POA: Insufficient documentation

## 2021-02-01 DIAGNOSIS — I700 Atherosclerosis of aorta: Secondary | ICD-10-CM | POA: Insufficient documentation

## 2021-02-01 DIAGNOSIS — C182 Malignant neoplasm of ascending colon: Secondary | ICD-10-CM | POA: Insufficient documentation

## 2021-02-01 DIAGNOSIS — G4089 Other seizures: Secondary | ICD-10-CM | POA: Insufficient documentation

## 2021-02-01 DIAGNOSIS — Z8261 Family history of arthritis: Secondary | ICD-10-CM | POA: Insufficient documentation

## 2021-02-01 DIAGNOSIS — L539 Erythematous condition, unspecified: Secondary | ICD-10-CM | POA: Insufficient documentation

## 2021-02-01 DIAGNOSIS — Z79899 Other long term (current) drug therapy: Secondary | ICD-10-CM | POA: Insufficient documentation

## 2021-02-01 DIAGNOSIS — R5383 Other fatigue: Secondary | ICD-10-CM | POA: Insufficient documentation

## 2021-02-01 DIAGNOSIS — Z5111 Encounter for antineoplastic chemotherapy: Secondary | ICD-10-CM | POA: Insufficient documentation

## 2021-02-01 DIAGNOSIS — Z8249 Family history of ischemic heart disease and other diseases of the circulatory system: Secondary | ICD-10-CM | POA: Insufficient documentation
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
## 2021-02-04 ENCOUNTER — Telehealth (HOSPITAL_COMMUNITY): Payer: Self-pay | Admitting: Hematology

## 2021-02-08 ENCOUNTER — Inpatient Hospital Stay (HOSPITAL_COMMUNITY): Payer: BLUE CROSS/BLUE SHIELD

## 2021-02-08 ENCOUNTER — Other Ambulatory Visit: Payer: Self-pay

## 2021-02-08 ENCOUNTER — Inpatient Hospital Stay (HOSPITAL_BASED_OUTPATIENT_CLINIC_OR_DEPARTMENT_OTHER): Payer: BLUE CROSS/BLUE SHIELD | Admitting: Hematology

## 2021-02-08 ENCOUNTER — Inpatient Hospital Stay (HOSPITAL_COMMUNITY): Payer: BLUE CROSS/BLUE SHIELD | Admitting: General Practice

## 2021-02-08 VITALS — BP 131/82 | HR 77 | Temp 97.0°F | Resp 18 | Wt 228.0 lb

## 2021-02-08 VITALS — BP 139/86 | HR 67 | Temp 97.1°F | Resp 18

## 2021-02-08 DIAGNOSIS — Z8249 Family history of ischemic heart disease and other diseases of the circulatory system: Secondary | ICD-10-CM | POA: Diagnosis not present

## 2021-02-08 DIAGNOSIS — R197 Diarrhea, unspecified: Secondary | ICD-10-CM | POA: Diagnosis not present

## 2021-02-08 DIAGNOSIS — C182 Malignant neoplasm of ascending colon: Secondary | ICD-10-CM

## 2021-02-08 DIAGNOSIS — Z95828 Presence of other vascular implants and grafts: Secondary | ICD-10-CM

## 2021-02-08 DIAGNOSIS — Z832 Family history of diseases of the blood and blood-forming organs and certain disorders involving the immune mechanism: Secondary | ICD-10-CM | POA: Diagnosis not present

## 2021-02-08 DIAGNOSIS — Z5111 Encounter for antineoplastic chemotherapy: Secondary | ICD-10-CM | POA: Diagnosis not present

## 2021-02-08 DIAGNOSIS — L539 Erythematous condition, unspecified: Secondary | ICD-10-CM | POA: Diagnosis not present

## 2021-02-08 DIAGNOSIS — G4089 Other seizures: Secondary | ICD-10-CM | POA: Diagnosis not present

## 2021-02-08 DIAGNOSIS — Z79899 Other long term (current) drug therapy: Secondary | ICD-10-CM | POA: Diagnosis not present

## 2021-02-08 DIAGNOSIS — Z8269 Family history of other diseases of the musculoskeletal system and connective tissue: Secondary | ICD-10-CM | POA: Diagnosis not present

## 2021-02-08 DIAGNOSIS — R5383 Other fatigue: Secondary | ICD-10-CM | POA: Diagnosis not present

## 2021-02-08 DIAGNOSIS — E669 Obesity, unspecified: Secondary | ICD-10-CM | POA: Diagnosis not present

## 2021-02-08 DIAGNOSIS — Z8261 Family history of arthritis: Secondary | ICD-10-CM | POA: Diagnosis not present

## 2021-02-08 DIAGNOSIS — I700 Atherosclerosis of aorta: Secondary | ICD-10-CM | POA: Diagnosis not present

## 2021-02-08 LAB — CBC WITH DIFFERENTIAL/PLATELET
Abs Immature Granulocytes: 0 10*3/uL (ref 0.00–0.07)
Basophils Absolute: 0.1 10*3/uL (ref 0.0–0.1)
Basophils Relative: 1 %
Eosinophils Absolute: 0.3 10*3/uL (ref 0.0–0.5)
Eosinophils Relative: 8 %
HCT: 45.2 % (ref 39.0–52.0)
Hemoglobin: 14.9 g/dL (ref 13.0–17.0)
Immature Granulocytes: 0 %
Lymphocytes Relative: 30 %
Lymphs Abs: 1.2 10*3/uL (ref 0.7–4.0)
MCH: 29.4 pg (ref 26.0–34.0)
MCHC: 33 g/dL (ref 30.0–36.0)
MCV: 89.3 fL (ref 80.0–100.0)
Monocytes Absolute: 0.4 10*3/uL (ref 0.1–1.0)
Monocytes Relative: 10 %
Neutro Abs: 2 10*3/uL (ref 1.7–7.7)
Neutrophils Relative %: 51 %
Platelets: 201 10*3/uL (ref 150–400)
RBC: 5.06 MIL/uL (ref 4.22–5.81)
RDW: 15.9 % — ABNORMAL HIGH (ref 11.5–15.5)
WBC: 4 10*3/uL (ref 4.0–10.5)
nRBC: 0 % (ref 0.0–0.2)

## 2021-02-08 LAB — COMPREHENSIVE METABOLIC PANEL
ALT: 16 U/L (ref 0–44)
AST: 19 U/L (ref 15–41)
Albumin: 4 g/dL (ref 3.5–5.0)
Alkaline Phosphatase: 81 U/L (ref 38–126)
Anion gap: 9 (ref 5–15)
BUN: 9 mg/dL (ref 6–20)
CO2: 25 mmol/L (ref 22–32)
Calcium: 8.7 mg/dL — ABNORMAL LOW (ref 8.9–10.3)
Chloride: 106 mmol/L (ref 98–111)
Creatinine, Ser: 0.95 mg/dL (ref 0.61–1.24)
GFR, Estimated: >60 mL/min (ref >60)
Glucose, Bld: 100 mg/dL — ABNORMAL HIGH (ref 70–99)
Potassium: 3.9 mmol/L (ref 3.5–5.1)
Sodium: 140 mmol/L (ref 135–145)
Total Bilirubin: 1.1 mg/dL (ref 0.3–1.2)
Total Protein: 7 g/dL (ref 6.5–8.1)

## 2021-02-08 LAB — MAGNESIUM: Magnesium: 2.1 mg/dL (ref 1.7–2.4)

## 2021-02-08 MED ORDER — SODIUM CHLORIDE 0.9 % IV SOLN
5000.0000 mg | INTRAVENOUS | Status: DC
  Administered 2021-02-08: 5000 mg via INTRAVENOUS
  Filled 2021-02-08: qty 100

## 2021-02-08 MED ORDER — METHYLPREDNISOLONE SODIUM SUCC 125 MG IJ SOLR
125.0000 mg | Freq: Once | INTRAMUSCULAR | Status: AC | PRN
  Administered 2021-02-08: 125 mg via INTRAVENOUS

## 2021-02-08 MED ORDER — FLUOROURACIL CHEMO INJECTION 2.5 GM/50ML
400.0000 mg/m2 | Freq: Once | INTRAVENOUS | Status: AC
Start: 2021-02-08 — End: 2021-02-08
  Administered 2021-02-08: 900 mg via INTRAVENOUS
  Filled 2021-02-08: qty 18

## 2021-02-08 MED ORDER — DEXTROSE 5 % IV SOLN
Freq: Once | INTRAVENOUS | Status: AC
Start: 2021-02-08 — End: 2021-02-08

## 2021-02-08 MED ORDER — DIPHENHYDRAMINE HCL 50 MG/ML IJ SOLN
25.0000 mg | Freq: Once | INTRAMUSCULAR | Status: AC
  Administered 2021-02-08: 25 mg via INTRAVENOUS

## 2021-02-08 MED ORDER — DEXTROSE 5 % IV SOLN
85.0000 mg/m2 | Freq: Once | INTRAVENOUS | Status: AC
  Administered 2021-02-08: 190 mg via INTRAVENOUS
  Filled 2021-02-08: qty 38

## 2021-02-08 MED ORDER — PALONOSETRON HCL INJECTION 0.25 MG/5ML
0.2500 mg | Freq: Once | INTRAVENOUS | Status: AC
Start: 2021-02-08 — End: 2021-02-08
  Administered 2021-02-08: 0.25 mg via INTRAVENOUS
  Filled 2021-02-08: qty 5

## 2021-02-08 MED ORDER — DEXTROSE 5 % IV SOLN
400.0000 mg/m2 | Freq: Once | INTRAVENOUS | Status: AC
  Administered 2021-02-08: 892 mg via INTRAVENOUS
  Filled 2021-02-08: qty 44.6

## 2021-02-08 MED ORDER — FAMOTIDINE IN NACL 20-0.9 MG/50ML-% IV SOLN
20.0000 mg | Freq: Once | INTRAVENOUS | Status: AC
  Administered 2021-02-08: 20 mg via INTRAVENOUS

## 2021-02-08 MED ORDER — DEXAMETHASONE SODIUM PHOSPHATE 100 MG/10ML IJ SOLN
10.0000 mg | Freq: Once | INTRAMUSCULAR | Status: AC
  Administered 2021-02-08: 10 mg via INTRAVENOUS
  Filled 2021-02-08: qty 10

## 2021-02-08 NOTE — Patient Instructions (Signed)
[place] Discharge Instructions for Patients Receiving Chemotherapy  Today you received the following chemotherapy agents Folfox.   To help prevent nausea and vomiting after your treatment, we encourage you to take your nausea medication.   If you develop nausea and vomiting that is not controlled by your nausea medication, call the clinic.   BELOW ARE SYMPTOMS THAT SHOULD BE REPORTED IMMEDIATELY:  *FEVER GREATER THAN 100.5 F  *CHILLS WITH OR WITHOUT FEVER  NAUSEA AND VOMITING THAT IS NOT CONTROLLED WITH YOUR NAUSEA MEDICATION  *UNUSUAL SHORTNESS OF BREATH  *UNUSUAL BRUISING OR BLEEDING  TENDERNESS IN MOUTH AND THROAT WITH OR WITHOUT PRESENCE OF ULCERS  *URINARY PROBLEMS  *BOWEL PROBLEMS  UNUSUAL RASH Items with * indicate a potential emergency and should be followed up as soon as possible.  Feel free to call the clinic should you have any questions or concerns. The clinic phone number is [phone].  Please show the [place] at check-in to the Emergency Department and triage nurse.

## 2021-02-08 NOTE — Progress Notes (Signed)
Confirmed giving full dose chemotherapy today.  T.O Dr Rhys Martini, PharmD

## 2021-02-08 NOTE — Progress Notes (Signed)
Patient was assessed by Dr. Katragadda and labs have been reviewed.  Patient is okay to proceed with treatment today. Primary RN and pharmacy aware.   

## 2021-02-08 NOTE — Progress Notes (Signed)
Okay to proceed with treatment today per CEdwards LPN / Dr.Katragadda. Labs reviewed by MD.   14:44 Upon entering room to check on patient, the patient states , " My mouth is numb and feels like it's asleep." Patient denies any shortness of breath, itching, numbness in throat or tongue. Patient denies pain at this time. Oxaliplatin and Leucovorin stopped at this time. Normal Saline hung and bolus administered at 528mls per hour. Vital signs obtained see flow sheet.   Verbal orders received from Dr. Delton Coombes to give Benadryl 25 mg IV and Solumedrol 125 mg IV now and 20 mg of Pepcid IV now per DWilson RN   14:49 pm Benadryl 25 mg given by DWilson RN IV x 1 dose now.  14:50 pm Solumedrol 125 mg give IV push by DWilson RN x 1 dose now.  14:52 pm Pepcid 20 mg IV administered.    15:11 pm Patient complains of a warm feeling in the back of his throat per patient's words.   15:15 pm Dr. Delton Coombes at the bedside to assess patient. Vital signs stable see flow sheet.   15:28 pm Dr. Delton Coombes at the bedside. Verbal orders received to place patient on 5FU pump and may discharge home.   Treatment given today per MD orders. Vital signs stable. No complaints at this time. Discharged from clinic ambulatory in stable condition. 5FU pump infusing and RUN noted on the screen and verified with patient. Alert and oriented x 3. F/U with [facility] as scheduled.

## 2021-02-08 NOTE — Progress Notes (Signed)
[facility] CSW Progress Notes  Met w patient in infusion, discussed his concerns.  Concerns are mainly financial.  He has no income at this time.  Has applied for Social Security disability, no decision to date.  Patient asked that I call his father who is handling this situation for him p spoke w father.  Stressed that he can call [facility] directly to get status report, can check in w lawyer regularly for updates.  Also suggested he contact [place] for [employer] eligibility - per father, they have done this in the past but patient was over assets. Encouraged them to keep in touch w [employer] as financial situation changes.  Submitted his application to [place] for their annual grant, also submitted to [place] for [facility] [employer].  Asked Estate manager/land agent to contact patient re [place] grant if she has not already done so.  Edwyna Shell, LCSW Clinical Social Worker Phone:  [phone]

## 2021-02-08 NOTE — Patient Instructions (Addendum)
[place] at [facility] Discharge Instructions  You were seen today by Dr. Delton Coombes. He went over your recent results. You received your treatment today. Take 2 tablets of Imodium after your first watery bowel movement of the day, then take 1 tablet after every subsequent watery BM; do not take if your stool is soft. Eat and drink everything at room temperature. Dr. Delton Coombes will see you back in 2 weeks for labs and follow up.   Thank you for choosing [place] at [facility] to provide your oncology and hematology care.  To afford each patient quality time with our provider, please arrive at least 15 minutes before your scheduled appointment time.   If you have a lab appointment with the [facility] please come in thru the Main Entrance and check in at the main information desk  You need to re-schedule your appointment should you arrive 10 or more minutes late.  We strive to give you quality time with our providers, and arriving late affects you and other patients whose appointments are after yours.  Also, if you no show three or more times for appointments you may be dismissed from the clinic at the providers discretion.     Again, thank you for choosing [facility].  Our hope is that these requests will decrease the amount of time that you wait before being seen by our physicians.       _____________________________________________________________  Should you have questions after your visit to [facility], please contact our office at [phone] between the hours of 8:00 a.m. and 4:30 p.m.  Voicemails left after 4:00 p.m. will not be returned until the following business day.  For prescription refill requests, have your pharmacy contact our office and allow 72 hours.    Cancer Center Support Programs:   > Cancer Support Group  2nd Tuesday of the month 1pm-2pm, Journey Room
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
## 2021-02-08 NOTE — Progress Notes (Signed)
Patient presented this morning for port flush and labs.  Port flushed with no difficulty.  Blood return noted.  Patient remained stable during procedure.  Denies pain or discomfort.  Patient port left accessed for treatment.

## 2021-02-10 ENCOUNTER — Other Ambulatory Visit: Payer: Self-pay

## 2021-02-10 ENCOUNTER — Inpatient Hospital Stay (HOSPITAL_COMMUNITY): Payer: BLUE CROSS/BLUE SHIELD

## 2021-02-10 VITALS — BP 147/78 | HR 84 | Temp 98.6°F | Resp 16

## 2021-02-10 DIAGNOSIS — I700 Atherosclerosis of aorta: Secondary | ICD-10-CM | POA: Diagnosis not present

## 2021-02-10 DIAGNOSIS — G4089 Other seizures: Secondary | ICD-10-CM | POA: Diagnosis not present

## 2021-02-10 DIAGNOSIS — E669 Obesity, unspecified: Secondary | ICD-10-CM | POA: Diagnosis not present

## 2021-02-10 DIAGNOSIS — R197 Diarrhea, unspecified: Secondary | ICD-10-CM | POA: Diagnosis not present

## 2021-02-10 DIAGNOSIS — Z79899 Other long term (current) drug therapy: Secondary | ICD-10-CM | POA: Diagnosis not present

## 2021-02-10 DIAGNOSIS — R5383 Other fatigue: Secondary | ICD-10-CM | POA: Diagnosis not present

## 2021-02-10 DIAGNOSIS — C182 Malignant neoplasm of ascending colon: Secondary | ICD-10-CM

## 2021-02-10 DIAGNOSIS — Z8249 Family history of ischemic heart disease and other diseases of the circulatory system: Secondary | ICD-10-CM | POA: Diagnosis not present

## 2021-02-10 DIAGNOSIS — Z8269 Family history of other diseases of the musculoskeletal system and connective tissue: Secondary | ICD-10-CM | POA: Diagnosis not present

## 2021-02-10 DIAGNOSIS — Z832 Family history of diseases of the blood and blood-forming organs and certain disorders involving the immune mechanism: Secondary | ICD-10-CM | POA: Diagnosis not present

## 2021-02-10 DIAGNOSIS — L539 Erythematous condition, unspecified: Secondary | ICD-10-CM | POA: Diagnosis not present

## 2021-02-10 DIAGNOSIS — Z8261 Family history of arthritis: Secondary | ICD-10-CM | POA: Diagnosis not present

## 2021-02-10 DIAGNOSIS — Z5111 Encounter for antineoplastic chemotherapy: Secondary | ICD-10-CM | POA: Diagnosis not present

## 2021-02-10 DIAGNOSIS — Z95828 Presence of other vascular implants and grafts: Secondary | ICD-10-CM

## 2021-02-10 MED ORDER — SODIUM CHLORIDE 0.9% FLUSH
10.0000 mL | INTRAVENOUS | Status: DC | PRN
  Administered 2021-02-10: 10 mL

## 2021-02-10 MED ORDER — HEPARIN SOD (PORK) LOCK FLUSH 100 UNIT/ML IV SOLN
500.0000 [IU] | Freq: Once | INTRAVENOUS | Status: AC | PRN
  Administered 2021-02-10: 500 [IU]

## 2021-02-10 NOTE — Progress Notes (Signed)
Carlos Miranda returns today for port de access and flush after 46 hr continous infusion of 34fu. Tolerated infusion without problems. Portacath located left chest wall was  deaccessed and flushed with 6ml NS and 500U/38ml Heparin and needle removed intact.  Procedure without incident. Patient tolerated procedure well.   Vitals stable and discharged home from clinic ambulatory. Follow up as scheduled.

## 2021-02-10 NOTE — Patient Instructions (Signed)
[facility] at [facility]  Discharge Instructions:   _______________________________________________________________  Thank you for choosing [facility] at [facility] to provide your oncology and hematology care.  To afford each patient quality time with our providers, please arrive at least 15 minutes before your scheduled appointment.  You need to re-schedule your appointment if you arrive 10 or more minutes late.  We strive to give you quality time with our providers, and arriving late affects you and other patients whose appointments are after yours.  Also, if you no show three or more times for appointments you may be dismissed from the clinic.  Again, thank you for choosing [facility] at [facility]. Our hope is that these requests will allow you access to exceptional care and in a timely manner. _______________________________________________________________  If you have questions after your visit, please contact our office at [phone] between the hours of 8:30 a.m. and 5:00 p.m. Voicemails left after 4:30 p.m. will not be returned until the following business day. _______________________________________________________________  For prescription refill requests, have your pharmacy contact our office. _______________________________________________________________  Recommendations made by the consultant and any test results will be sent to your referring physician. _______________________________________________________________ 

## 2021-02-17 NOTE — Progress Notes (Signed)
Additional premedication added and oxaliplatin infusion extended as a result of 02/08/21 infusion reaction.  Addition of Pepcid 20 mg IVPB x 1 and Diphenhydramine 50 mg IVpush x 1 Increase oxaliplatin infusion to infuse over 4 hours.  Plan updated as requested.  T.O. Dr Rhys Martini, PharmD 02/17/21 @ [phone]

## 2021-02-22 ENCOUNTER — Inpatient Hospital Stay (HOSPITAL_BASED_OUTPATIENT_CLINIC_OR_DEPARTMENT_OTHER): Payer: BLUE CROSS/BLUE SHIELD | Admitting: Hematology

## 2021-02-22 ENCOUNTER — Inpatient Hospital Stay (HOSPITAL_COMMUNITY): Payer: BLUE CROSS/BLUE SHIELD

## 2021-02-22 ENCOUNTER — Other Ambulatory Visit: Payer: Self-pay

## 2021-02-22 VITALS — BP 134/80 | HR 78 | Temp 96.9°F | Resp 18 | Wt 227.6 lb

## 2021-02-22 VITALS — BP 115/64 | HR 73 | Temp 97.3°F | Resp 17

## 2021-02-22 DIAGNOSIS — C182 Malignant neoplasm of ascending colon: Secondary | ICD-10-CM

## 2021-02-22 DIAGNOSIS — Z8261 Family history of arthritis: Secondary | ICD-10-CM | POA: Diagnosis not present

## 2021-02-22 DIAGNOSIS — Z8269 Family history of other diseases of the musculoskeletal system and connective tissue: Secondary | ICD-10-CM | POA: Diagnosis not present

## 2021-02-22 DIAGNOSIS — I700 Atherosclerosis of aorta: Secondary | ICD-10-CM | POA: Diagnosis not present

## 2021-02-22 DIAGNOSIS — L539 Erythematous condition, unspecified: Secondary | ICD-10-CM | POA: Diagnosis not present

## 2021-02-22 DIAGNOSIS — Z832 Family history of diseases of the blood and blood-forming organs and certain disorders involving the immune mechanism: Secondary | ICD-10-CM | POA: Diagnosis not present

## 2021-02-22 DIAGNOSIS — G4089 Other seizures: Secondary | ICD-10-CM | POA: Diagnosis not present

## 2021-02-22 DIAGNOSIS — R197 Diarrhea, unspecified: Secondary | ICD-10-CM | POA: Diagnosis not present

## 2021-02-22 DIAGNOSIS — E669 Obesity, unspecified: Secondary | ICD-10-CM | POA: Diagnosis not present

## 2021-02-22 DIAGNOSIS — Z95828 Presence of other vascular implants and grafts: Secondary | ICD-10-CM

## 2021-02-22 DIAGNOSIS — R5383 Other fatigue: Secondary | ICD-10-CM | POA: Diagnosis not present

## 2021-02-22 DIAGNOSIS — Z79899 Other long term (current) drug therapy: Secondary | ICD-10-CM | POA: Diagnosis not present

## 2021-02-22 DIAGNOSIS — Z5111 Encounter for antineoplastic chemotherapy: Secondary | ICD-10-CM | POA: Diagnosis not present

## 2021-02-22 DIAGNOSIS — Z8249 Family history of ischemic heart disease and other diseases of the circulatory system: Secondary | ICD-10-CM | POA: Diagnosis not present

## 2021-02-22 LAB — CBC WITH DIFFERENTIAL/PLATELET
Abs Immature Granulocytes: 0.01 10*3/uL (ref 0.00–0.07)
Basophils Absolute: 0 10*3/uL (ref 0.0–0.1)
Basophils Relative: 1 %
Eosinophils Absolute: 0.2 10*3/uL (ref 0.0–0.5)
Eosinophils Relative: 5 %
HCT: 45.8 % (ref 39.0–52.0)
Hemoglobin: 15 g/dL (ref 13.0–17.0)
Immature Granulocytes: 0 %
Lymphocytes Relative: 31 %
Lymphs Abs: 1.1 10*3/uL (ref 0.7–4.0)
MCH: 29.8 pg (ref 26.0–34.0)
MCHC: 32.8 g/dL (ref 30.0–36.0)
MCV: 91.1 fL (ref 80.0–100.0)
Monocytes Absolute: 0.3 10*3/uL (ref 0.1–1.0)
Monocytes Relative: 9 %
Neutro Abs: 1.9 10*3/uL (ref 1.7–7.7)
Neutrophils Relative %: 54 %
Platelets: 160 10*3/uL (ref 150–400)
RBC: 5.03 MIL/uL (ref 4.22–5.81)
RDW: 17.4 % — ABNORMAL HIGH (ref 11.5–15.5)
WBC: 3.5 10*3/uL — ABNORMAL LOW (ref 4.0–10.5)
nRBC: 0 % (ref 0.0–0.2)

## 2021-02-22 LAB — COMPREHENSIVE METABOLIC PANEL
ALT: 23 U/L (ref 0–44)
AST: 26 U/L (ref 15–41)
Albumin: 3.8 g/dL (ref 3.5–5.0)
Alkaline Phosphatase: 80 U/L (ref 38–126)
Anion gap: 7 (ref 5–15)
BUN: 12 mg/dL (ref 6–20)
CO2: 24 mmol/L (ref 22–32)
Calcium: 8.9 mg/dL (ref 8.9–10.3)
Chloride: 107 mmol/L (ref 98–111)
Creatinine, Ser: 0.94 mg/dL (ref 0.61–1.24)
GFR, Estimated: >60 mL/min (ref >60)
Glucose, Bld: 148 mg/dL — ABNORMAL HIGH (ref 70–99)
Potassium: 3.3 mmol/L — ABNORMAL LOW (ref 3.5–5.1)
Sodium: 138 mmol/L (ref 135–145)
Total Bilirubin: 0.7 mg/dL (ref 0.3–1.2)
Total Protein: 6.7 g/dL (ref 6.5–8.1)

## 2021-02-22 LAB — MAGNESIUM: Magnesium: 2.1 mg/dL (ref 1.7–2.4)

## 2021-02-22 MED ORDER — LEUCOVORIN CALCIUM INJECTION 350 MG
400.0000 mg/m2 | Freq: Once | INTRAVENOUS | Status: AC
  Administered 2021-02-22: 892 mg via INTRAVENOUS
  Filled 2021-02-22: qty 44.6

## 2021-02-22 MED ORDER — SODIUM CHLORIDE 0.9 % IV SOLN
10.0000 mg | Freq: Once | INTRAVENOUS | Status: AC
Start: 2021-02-22 — End: 2021-02-22
  Administered 2021-02-22: 10 mg via INTRAVENOUS
  Filled 2021-02-22: qty 10

## 2021-02-22 MED ORDER — DEXTROSE 5 % IV SOLN: Freq: Once | INTRAVENOUS | Status: AC

## 2021-02-22 MED ORDER — POTASSIUM CHLORIDE CRYS ER 20 MEQ PO TBCR
20.0000 meq | EXTENDED_RELEASE_TABLET | Freq: Once | ORAL | Status: AC
  Administered 2021-02-22: 20 meq via ORAL
  Filled 2021-02-22: qty 1

## 2021-02-22 MED ORDER — FLUOROURACIL CHEMO INJECTION 2.5 GM/50ML
400.0000 mg/m2 | Freq: Once | INTRAVENOUS | Status: AC
  Administered 2021-02-22: 900 mg via INTRAVENOUS
  Filled 2021-02-22: qty 18

## 2021-02-22 MED ORDER — OXALIPLATIN CHEMO INJECTION 100 MG/20ML
85.0000 mg/m2 | Freq: Once | INTRAVENOUS | Status: AC
  Administered 2021-02-22: 190 mg via INTRAVENOUS
  Filled 2021-02-22: qty 38

## 2021-02-22 MED ORDER — SODIUM CHLORIDE 0.9 % IV SOLN
5000.0000 mg | INTRAVENOUS | Status: DC
  Administered 2021-02-22: 5000 mg via INTRAVENOUS
  Filled 2021-02-22: qty 100

## 2021-02-22 MED ORDER — FAMOTIDINE IN NACL 20-0.9 MG/50ML-% IV SOLN
INTRAVENOUS | Status: AC
  Filled 2021-02-22: qty 50

## 2021-02-22 MED ORDER — PALONOSETRON HCL INJECTION 0.25 MG/5ML
0.2500 mg | Freq: Once | INTRAVENOUS | Status: AC
Start: 2021-02-22 — End: 2021-02-22
  Administered 2021-02-22: 0.25 mg via INTRAVENOUS
  Filled 2021-02-22: qty 5

## 2021-02-22 MED ORDER — FAMOTIDINE IN NACL 20-0.9 MG/50ML-% IV SOLN
20.0000 mg | Freq: Once | INTRAVENOUS | Status: AC
  Administered 2021-02-22: 20 mg via INTRAVENOUS

## 2021-02-22 MED ORDER — HEPARIN SOD (PORK) LOCK FLUSH 100 UNIT/ML IV SOLN: 500.0000 [IU] | Freq: Once | INTRAVENOUS | Status: DC | PRN

## 2021-02-22 MED ORDER — DIPHENHYDRAMINE HCL 50 MG/ML IJ SOLN
50.0000 mg | Freq: Once | INTRAMUSCULAR | Status: AC
  Administered 2021-02-22: 50 mg via INTRAVENOUS
  Filled 2021-02-22: qty 1

## 2021-02-22 MED ORDER — SODIUM CHLORIDE 0.9% FLUSH: 10.0000 mL | INTRAVENOUS | Status: DC | PRN

## 2021-02-22 NOTE — Patient Instructions (Addendum)
[place] at [facility] Discharge Instructions  You were seen today by Dr. Delton Coombes. He went over your recent results. You received your treatment today. Gargle your mouth with salt water if you develop any more mouth sores. Purchase Imodium over the counter and take 2 tablets after the first watery bowel movement; take 1 tablet with every subsequent watery BM, but DO NOT take if your BM is formed and soft. Dr. Delton Coombes will see you back in 2 weeks for labs and follow up.   Thank you for choosing [place] at [facility] to provide your oncology and hematology care.  To afford each patient quality time with our provider, please arrive at least 15 minutes before your scheduled appointment time.   If you have a lab appointment with the [facility] please come in thru the Main Entrance and check in at the main information desk  You need to re-schedule your appointment should you arrive 10 or more minutes late.  We strive to give you quality time with our providers, and arriving late affects you and other patients whose appointments are after yours.  Also, if you no show three or more times for appointments you may be dismissed from the clinic at the providers discretion.     Again, thank you for choosing [facility].  Our hope is that these requests will decrease the amount of time that you wait before being seen by our physicians.       _____________________________________________________________  Should you have questions after your visit to [facility], please contact our office at [phone] between the hours of 8:00 a.m. and 4:30 p.m.  Voicemails left after 4:00 p.m. will not be returned until the following business day.  For prescription refill requests, have your pharmacy contact our office and allow 72 hours.    Cancer Center Support Programs:   > Cancer Support Group  2nd Tuesday of the month 1pm-2pm,  Journey Room

## 2021-02-22 NOTE — Patient Instructions (Signed)
[facility]  Discharge Instructions: °Thank you for choosing [facility] to provide your oncology and hematology care.  °If you have a lab appointment with the Cancer Center, please come in thru the Main Entrance and check in at the main information desk. ° °Wear comfortable clothing and clothing appropriate for easy access to any Portacath or PICC line.  ° °We strive to give you quality time with your provider. You may need to reschedule your appointment if you arrive late (15 or more minutes).  Arriving late affects you and other patients whose appointments are after yours.  Also, if you miss three or more appointments without notifying the office, you may be dismissed from the clinic at the provider’s discretion.    °  °For prescription refill requests, have your pharmacy contact our office and allow 72 hours for refills to be completed.   ° °Today you received the following chemotherapy and/or immunotherapy agents FOLFOX with 5FU pump connection    °  °To help prevent nausea and vomiting after your treatment, we encourage you to take your nausea medication as directed. ° °BELOW ARE SYMPTOMS THAT SHOULD BE REPORTED IMMEDIATELY: °*FEVER GREATER THAN 100.4 F (38 °C) OR HIGHER °*CHILLS OR SWEATING °*NAUSEA AND VOMITING THAT IS NOT CONTROLLED WITH YOUR NAUSEA MEDICATION °*UNUSUAL SHORTNESS OF BREATH °*UNUSUAL BRUISING OR BLEEDING °*URINARY PROBLEMS (pain or burning when urinating, or frequent urination) °*BOWEL PROBLEMS (unusual diarrhea, constipation, pain near the anus) °TENDERNESS IN MOUTH AND THROAT WITH OR WITHOUT PRESENCE OF ULCERS (sore throat, sores in mouth, or a toothache) °UNUSUAL RASH, SWELLING OR PAIN  °UNUSUAL VAGINAL DISCHARGE OR ITCHING  ° °Items with * indicate a potential emergency and should be followed up as soon as possible or go to the Emergency Department if any problems should occur. ° °Please show the CHEMOTHERAPY ALERT CARD or IMMUNOTHERAPY ALERT CARD at check-in  to the Emergency Department and triage nurse. ° °Should you have questions after your visit or need to cancel or reschedule your appointment, please contact [facility] [phone]  and follow the prompts.  Office hours are 8:00 a.m. to 4:30 p.m. Monday - Friday. Please note that voicemails left after 4:00 p.m. may not be returned until the following business day.  We are closed weekends and major holidays. You have access to a nurse at all times for urgent questions. Please call the main number to the clinic [phone] and follow the prompts. ° °For any non-urgent questions, you may also contact your provider using MyChart. We now offer e-Visits for anyone 18 and older to request care online for non-urgent symptoms. For details visit [email or web address]. °  °Also download the MyChart app! Go to the app store, search "MyChart", open the app, select [facility], and log in with your MyChart username and password. ° °Due to Covid, a mask is required upon entering the hospital/clinic. If you do not have a mask, one will be given to you upon arrival. For doctor visits, patients may have 1 support person aged 18 or older with them. For treatment visits, patients cannot have anyone with them due to current Covid guidelines and our immunocompromised population.  °
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
## 2021-02-22 NOTE — Progress Notes (Signed)
Assessment completed by Dr. Delton Coombes.  Labs reviewed.  Will add 20 meq K Dur today and proceed with treatment.

## 2021-02-22 NOTE — Progress Notes (Signed)
Patient presents today for FOLFOX infusion with 5FU pump connection.  Vital signs and labs within parameters for treatment.  Patients only complaint is of a mouth sore that went away after gargling salt water.    Message received from [place] patient okay for treatment.  Patient to receive 20 mEq of PO potassium with treatment.  FOLFOX infusion with 5FU pump start given today per MD orders. Stable during infusion without adverse affects.  5FU pump connected and verified RUN on the screen with patient.  Vital signs stable.  No complaints at this time.  Discharge from clinic ambulatory in stable condition.  Alert and oriented X 3.  Follow up with [facility] as scheduled.

## 2021-02-24 ENCOUNTER — Other Ambulatory Visit: Payer: Self-pay

## 2021-02-24 ENCOUNTER — Inpatient Hospital Stay (HOSPITAL_COMMUNITY): Payer: BLUE CROSS/BLUE SHIELD

## 2021-02-24 DIAGNOSIS — Z832 Family history of diseases of the blood and blood-forming organs and certain disorders involving the immune mechanism: Secondary | ICD-10-CM | POA: Diagnosis not present

## 2021-02-24 DIAGNOSIS — Z79899 Other long term (current) drug therapy: Secondary | ICD-10-CM | POA: Diagnosis not present

## 2021-02-24 DIAGNOSIS — R5383 Other fatigue: Secondary | ICD-10-CM | POA: Diagnosis not present

## 2021-02-24 DIAGNOSIS — L539 Erythematous condition, unspecified: Secondary | ICD-10-CM | POA: Diagnosis not present

## 2021-02-24 DIAGNOSIS — Z8261 Family history of arthritis: Secondary | ICD-10-CM | POA: Diagnosis not present

## 2021-02-24 DIAGNOSIS — G4089 Other seizures: Secondary | ICD-10-CM | POA: Diagnosis not present

## 2021-02-24 DIAGNOSIS — I700 Atherosclerosis of aorta: Secondary | ICD-10-CM | POA: Diagnosis not present

## 2021-02-24 DIAGNOSIS — C182 Malignant neoplasm of ascending colon: Secondary | ICD-10-CM | POA: Diagnosis not present

## 2021-02-24 DIAGNOSIS — E669 Obesity, unspecified: Secondary | ICD-10-CM | POA: Diagnosis not present

## 2021-02-24 DIAGNOSIS — Z8249 Family history of ischemic heart disease and other diseases of the circulatory system: Secondary | ICD-10-CM | POA: Diagnosis not present

## 2021-02-24 DIAGNOSIS — Z5111 Encounter for antineoplastic chemotherapy: Secondary | ICD-10-CM | POA: Diagnosis not present

## 2021-02-24 DIAGNOSIS — Z8269 Family history of other diseases of the musculoskeletal system and connective tissue: Secondary | ICD-10-CM | POA: Diagnosis not present

## 2021-02-24 DIAGNOSIS — R197 Diarrhea, unspecified: Secondary | ICD-10-CM | POA: Diagnosis not present

## 2021-02-24 MED ORDER — SODIUM CHLORIDE 0.9% FLUSH
10.0000 mL | INTRAVENOUS | Status: DC | PRN
  Administered 2021-02-24: 10 mL via INTRAVENOUS

## 2021-02-24 MED ORDER — HEPARIN SOD (PORK) LOCK FLUSH 100 UNIT/ML IV SOLN
500.0000 [IU] | Freq: Once | INTRAVENOUS | Status: AC
  Administered 2021-02-24: 500 [IU] via INTRAVENOUS

## 2021-02-24 NOTE — Progress Notes (Signed)
Patients port flushed without difficulty.  No blood return noted. No bruising or swelling noted at site.  Band aid applied.  VSS with discharge and left in satisfactory condition with no s/s of distress noted.  

## 2021-02-25 DIAGNOSIS — C182 Malignant neoplasm of ascending colon: Secondary | ICD-10-CM | POA: Diagnosis not present

## 2021-03-08 ENCOUNTER — Inpatient Hospital Stay (HOSPITAL_COMMUNITY): Payer: BLUE CROSS/BLUE SHIELD

## 2021-03-08 ENCOUNTER — Other Ambulatory Visit: Payer: Self-pay

## 2021-03-08 ENCOUNTER — Inpatient Hospital Stay (HOSPITAL_COMMUNITY): Payer: BLUE CROSS/BLUE SHIELD | Attending: Hematology

## 2021-03-08 ENCOUNTER — Other Ambulatory Visit (HOSPITAL_COMMUNITY): Payer: Self-pay | Admitting: *Deleted

## 2021-03-08 ENCOUNTER — Encounter (HOSPITAL_COMMUNITY): Payer: Self-pay

## 2021-03-08 ENCOUNTER — Inpatient Hospital Stay (HOSPITAL_BASED_OUTPATIENT_CLINIC_OR_DEPARTMENT_OTHER): Payer: BLUE CROSS/BLUE SHIELD | Admitting: Hematology

## 2021-03-08 DIAGNOSIS — R590 Localized enlarged lymph nodes: Secondary | ICD-10-CM | POA: Insufficient documentation

## 2021-03-08 DIAGNOSIS — G479 Sleep disorder, unspecified: Secondary | ICD-10-CM | POA: Diagnosis not present

## 2021-03-08 DIAGNOSIS — C182 Malignant neoplasm of ascending colon: Secondary | ICD-10-CM

## 2021-03-08 DIAGNOSIS — Z79899 Other long term (current) drug therapy: Secondary | ICD-10-CM | POA: Diagnosis not present

## 2021-03-08 DIAGNOSIS — E669 Obesity, unspecified: Secondary | ICD-10-CM | POA: Diagnosis not present

## 2021-03-08 DIAGNOSIS — Z832 Family history of diseases of the blood and blood-forming organs and certain disorders involving the immune mechanism: Secondary | ICD-10-CM | POA: Insufficient documentation

## 2021-03-08 DIAGNOSIS — Z5189 Encounter for other specified aftercare: Secondary | ICD-10-CM | POA: Diagnosis not present

## 2021-03-08 DIAGNOSIS — Z5111 Encounter for antineoplastic chemotherapy: Secondary | ICD-10-CM | POA: Diagnosis not present

## 2021-03-08 DIAGNOSIS — D702 Other drug-induced agranulocytosis: Secondary | ICD-10-CM

## 2021-03-08 DIAGNOSIS — Z8269 Family history of other diseases of the musculoskeletal system and connective tissue: Secondary | ICD-10-CM | POA: Diagnosis not present

## 2021-03-08 DIAGNOSIS — R200 Anesthesia of skin: Secondary | ICD-10-CM | POA: Insufficient documentation

## 2021-03-08 DIAGNOSIS — R5383 Other fatigue: Secondary | ICD-10-CM | POA: Insufficient documentation

## 2021-03-08 DIAGNOSIS — Z95828 Presence of other vascular implants and grafts: Secondary | ICD-10-CM

## 2021-03-08 DIAGNOSIS — Z8261 Family history of arthritis: Secondary | ICD-10-CM | POA: Diagnosis not present

## 2021-03-08 LAB — CBC WITH DIFFERENTIAL/PLATELET
Abs Immature Granulocytes: 0.01 10*3/uL (ref 0.00–0.07)
Basophils Absolute: 0 10*3/uL (ref 0.0–0.1)
Basophils Relative: 2 %
Eosinophils Absolute: 0.1 10*3/uL (ref 0.0–0.5)
Eosinophils Relative: 5 %
HCT: 45.3 % (ref 39.0–52.0)
Hemoglobin: 15 g/dL (ref 13.0–17.0)
Immature Granulocytes: 0 %
Lymphocytes Relative: 39 %
Lymphs Abs: 1.1 10*3/uL (ref 0.7–4.0)
MCH: 30.1 pg (ref 26.0–34.0)
MCHC: 33.1 g/dL (ref 30.0–36.0)
MCV: 91 fL (ref 80.0–100.0)
Monocytes Absolute: 0.4 10*3/uL (ref 0.1–1.0)
Monocytes Relative: 15 %
Neutro Abs: 1.1 10*3/uL — ABNORMAL LOW (ref 1.7–7.7)
Neutrophils Relative %: 39 %
Platelets: 142 10*3/uL — ABNORMAL LOW (ref 150–400)
RBC: 4.98 MIL/uL (ref 4.22–5.81)
RDW: 18.4 % — ABNORMAL HIGH (ref 11.5–15.5)
WBC: 2.7 10*3/uL — ABNORMAL LOW (ref 4.0–10.5)
nRBC: 0 % (ref 0.0–0.2)

## 2021-03-08 LAB — COMPREHENSIVE METABOLIC PANEL
ALT: 31 U/L (ref 0–44)
AST: 29 U/L (ref 15–41)
Albumin: 3.8 g/dL (ref 3.5–5.0)
Alkaline Phosphatase: 90 U/L (ref 38–126)
Anion gap: 8 (ref 5–15)
BUN: 13 mg/dL (ref 6–20)
CO2: 25 mmol/L (ref 22–32)
Calcium: 8.8 mg/dL — ABNORMAL LOW (ref 8.9–10.3)
Chloride: 107 mmol/L (ref 98–111)
Creatinine, Ser: 0.98 mg/dL (ref 0.61–1.24)
GFR, Estimated: >60 mL/min (ref >60)
Glucose, Bld: 120 mg/dL — ABNORMAL HIGH (ref 70–99)
Potassium: 3.2 mmol/L — ABNORMAL LOW (ref 3.5–5.1)
Sodium: 140 mmol/L (ref 135–145)
Total Bilirubin: 0.9 mg/dL (ref 0.3–1.2)
Total Protein: 6.8 g/dL (ref 6.5–8.1)

## 2021-03-08 LAB — MAGNESIUM: Magnesium: 2 mg/dL (ref 1.7–2.4)

## 2021-03-08 MED ORDER — SODIUM CHLORIDE 0.9% FLUSH
10.0000 mL | Freq: Once | INTRAVENOUS | Status: AC
  Administered 2021-03-08: 10 mL via INTRAVENOUS

## 2021-03-08 MED ORDER — FILGRASTIM-SNDZ 480 MCG/0.8ML IJ SOSY
480.0000 ug | PREFILLED_SYRINGE | Freq: Once | INTRAMUSCULAR | Status: AC
  Administered 2021-03-08: 480 ug via SUBCUTANEOUS
  Filled 2021-03-08: qty 0.8

## 2021-03-08 MED ORDER — POTASSIUM CHLORIDE CRYS ER 20 MEQ PO TBCR: 20.0000 meq | EXTENDED_RELEASE_TABLET | Freq: Every day | ORAL | 0 refills | Status: DC

## 2021-03-08 MED ORDER — HEPARIN SOD (PORK) LOCK FLUSH 100 UNIT/ML IV SOLN
500.0000 [IU] | Freq: Once | INTRAVENOUS | Status: AC
  Administered 2021-03-08: 500 [IU] via INTRAVENOUS

## 2021-03-08 NOTE — Patient Instructions (Signed)
[place] at [facility] Discharge Instructions  You were seen today by Dr. Delton Coombes. He went over your recent results. You will receive your treatment tomorrow. Dr. Delton Coombes will see you back in 2 weeks for labs and follow up.   Thank you for choosing [place] at [facility] to provide your oncology and hematology care.  To afford each patient quality time with our provider, please arrive at least 15 minutes before your scheduled appointment time.   If you have a lab appointment with the [facility] please come in thru the Main Entrance and check in at the main information desk  You need to re-schedule your appointment should you arrive 10 or more minutes late.  We strive to give you quality time with our providers, and arriving late affects you and other patients whose appointments are after yours.  Also, if you no show three or more times for appointments you may be dismissed from the clinic at the providers discretion.     Again, thank you for choosing [facility].  Our hope is that these requests will decrease the amount of time that you wait before being seen by our physicians.       _____________________________________________________________  Should you have questions after your visit to [facility], please contact our office at [phone] between the hours of 8:00 a.m. and 4:30 p.m.  Voicemails left after 4:00 p.m. will not be returned until the following business day.  For prescription refill requests, have your pharmacy contact our office and allow 72 hours.    Cancer Center Support Programs:   > Cancer Support Group  2nd Tuesday of the month 1pm-2pm, Journey Room

## 2021-03-08 NOTE — Progress Notes (Signed)
Give Zarxio 480 mcg Subq today for ANC 1.1. Chemotherapy tomorrow Add Udenyca 6 mg Subq on pump dc day for all future treatments.  T.O. Dr Rhys Martini, PharmD

## 2021-03-08 NOTE — Patient Instructions (Addendum)
Filgrastim, G-CSF injection What is this medicine? FILGRASTIM, G-CSF (fil GRA stim) is a granulocyte colony-stimulating factor that stimulates the growth of neutrophils, a type of white blood cell (WBC) important in the body's fight against infection. It is used to reduce the incidence of fever and infection in patients with certain types of cancer who are receiving chemotherapy that affects the bone marrow, to stimulate blood cell production for removal of WBCs from the body prior to a bone marrow transplantation, to reduce the incidence of fever and infection in patients who have severe chronic neutropenia, and to improve survival outcomes following high-dose radiation exposure that is toxic to the bone marrow. This medicine may be used for other purposes; ask your health care provider or pharmacist if you have questions. COMMON BRAND NAME(S): Neupogen, Nivestym, Zarxio What should I tell my health care provider before I take this medicine? They need to know if you have any of these conditions:  kidney disease  latex allergy  ongoing radiation therapy  sickle cell disease  an unusual or allergic reaction to filgrastim, pegfilgrastim, other medicines, foods, dyes, or preservatives  pregnant or trying to get pregnant  breast-feeding How should I use this medicine? This medicine is for injection under the skin or infusion into a vein. As an infusion into a vein, it is usually given by a health care professional in a hospital or clinic setting. If you get this medicine at home, you will be taught how to prepare and give this medicine. Refer to the Instructions for Use that come with your medication packaging. Use exactly as directed. Take your medicine at regular intervals. Do not take your medicine more often than directed. It is important that you put your used needles and syringes in a special sharps container. Do not put them in a trash can. If you do not have a sharps container, call your  pharmacist or healthcare provider to get one. Talk to your pediatrician regarding the use of this medicine in children. While this drug may be prescribed for children as young as 7 months for selected conditions, precautions do apply. Overdosage: If you think you have taken too much of this medicine contact a poison control center or emergency room at once. NOTE: This medicine is only for you. Do not share this medicine with others. What if I miss a dose? It is important not to miss your dose. Call your doctor or health care professional if you miss a dose. What may interact with this medicine? This medicine may interact with the following medications:  medicines that may cause a release of neutrophils, such as lithium This list may not describe all possible interactions. Give your health care provider a list of all the medicines, herbs, non-prescription drugs, or dietary supplements you use. Also tell them if you smoke, drink alcohol, or use illegal drugs. Some items may interact with your medicine. What should I watch for while using this medicine? Your condition will be monitored carefully while you are receiving this medicine. You may need blood work done while you are taking this medicine. Talk to your health care provider about your risk of cancer. You may be more at risk for certain types of cancer if you take this medicine. What side effects may I notice from receiving this medicine? Side effects that you should report to your doctor or health care professional as soon as possible:  allergic reactions like skin rash, itching or hives, swelling of the face, lips, or tongue    back pain  dizziness or feeling faint  fever  pain, redness, or irritation at site where injected  pinpoint red spots on the skin  shortness of breath or breathing problems  signs and symptoms of kidney injury like trouble passing urine, change in the amount of urine, or red or dark-brown urine  stomach or  side pain, or pain at the shoulder  swelling  tiredness  unusual bleeding or bruising Side effects that usually do not require medical attention (report to your doctor or health care professional if they continue or are bothersome):  bone pain  cough  diarrhea  hair loss  headache  muscle pain This list may not describe all possible side effects. Call your doctor for medical advice about side effects. You may report side effects to FDA at 1-800-FDA-1088. Where should I keep my medicine? Keep out of the reach of children. Store in a refrigerator between 2 and 8 degrees C (36 and 46 degrees F). Do not freeze. Keep in carton to protect from light. Throw away this medicine if vials or syringes are left out of the refrigerator for more than 24 hours. Throw away any unused medicine after the expiration date. NOTE: This sheet is a summary. It may not cover all possible information. If you have questions about this medicine, talk to your doctor, pharmacist, or health care provider.  2021 Elsevier/Gold Standard (2019-11-06 18:47:55)  [facility]  Discharge Instructions: Thank you for choosing [facility] to provide your oncology and hematology care.  If you have a lab appointment with the [facility], please come in thru the Main Entrance and check in at the main information desk.  Wear comfortable clothing and clothing appropriate for easy access to any Portacath or PICC line.   We strive to give you quality time with your provider. You may need to reschedule your appointment if you arrive late (15 or more minutes).  Arriving late affects you and other patients whose appointments are after yours.  Also, if you miss three or more appointments without notifying the office, you may be dismissed from the clinic at the provider's discretion.      For prescription refill requests, have your pharmacy contact our office and allow 72 hours for refills to be completed.     Today you received filgrastim-sndz (Zarxio) injection, return as scheduled.   To help prevent nausea and vomiting after your treatment, we encourage you to take your nausea medication as directed.  BELOW ARE SYMPTOMS THAT SHOULD BE REPORTED IMMEDIATELY: . *FEVER GREATER THAN 100.4 F (38 C) OR HIGHER . *CHILLS OR SWEATING . *NAUSEA AND VOMITING THAT IS NOT CONTROLLED WITH YOUR NAUSEA MEDICATION . *UNUSUAL SHORTNESS OF BREATH . *UNUSUAL BRUISING OR BLEEDING . *URINARY PROBLEMS (pain or burning when urinating, or frequent urination) . *BOWEL PROBLEMS (unusual diarrhea, constipation, pain near the anus) . TENDERNESS IN MOUTH AND THROAT WITH OR WITHOUT PRESENCE OF ULCERS (sore throat, sores in mouth, or a toothache) . UNUSUAL RASH, SWELLING OR PAIN  . UNUSUAL VAGINAL DISCHARGE OR ITCHING   Items with * indicate a potential emergency and should be followed up as soon as possible or go to the Emergency Department if any problems should occur.  Please show the CHEMOTHERAPY ALERT CARD or IMMUNOTHERAPY ALERT CARD at check-in to the Emergency Department and triage nurse.  Should you have questions after your visit or need to cancel or reschedule your appointment, please contact [facility] [phone]  and  follow the prompts.  Office hours are 8:00 a.m. to 4:30 p.m. Monday - Friday. Please note that voicemails left after 4:00 p.m. may not be returned until the following business day.  We are closed weekends and major holidays. You have access to a nurse at all times for urgent questions. Please call the main number to the clinic [phone] and follow the prompts.  For any non-urgent questions, you may also contact your provider using MyChart. We now offer e-Visits for anyone 51 and older to request care online for non-urgent symptoms. For details visit [email or web address].   Also download the MyChart app! Go to the app store, search "MyChart", open the app, select [facility],  and log in with your MyChart username and password.  Due to Covid, a mask is required upon entering the hospital/clinic. If you do not have a mask, one will be given to you upon arrival. For doctor visits, patients may have 1 support person aged 48 or older with them. For treatment visits, patients cannot have anyone with them due to current Covid guidelines and our immunocompromised population.
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
## 2021-03-08 NOTE — Progress Notes (Signed)
No treatment today per Dr. Delton Coombes, patient received Zarxio injection today and will return tomorrow for possible treatment. Port flushed with good blood return noted, No bruising or swelling at site. Bandaid applied and patient discharged in satisfactory condition. VVS stable with no signs or symptoms of distressed noted.

## 2021-03-09 ENCOUNTER — Inpatient Hospital Stay (HOSPITAL_COMMUNITY): Payer: BLUE CROSS/BLUE SHIELD

## 2021-03-09 ENCOUNTER — Encounter (HOSPITAL_COMMUNITY): Payer: Self-pay

## 2021-03-09 VITALS — BP 125/72 | HR 72 | Temp 97.2°F | Resp 17

## 2021-03-09 DIAGNOSIS — D702 Other drug-induced agranulocytosis: Secondary | ICD-10-CM

## 2021-03-09 DIAGNOSIS — Z832 Family history of diseases of the blood and blood-forming organs and certain disorders involving the immune mechanism: Secondary | ICD-10-CM | POA: Diagnosis not present

## 2021-03-09 DIAGNOSIS — C182 Malignant neoplasm of ascending colon: Secondary | ICD-10-CM | POA: Diagnosis not present

## 2021-03-09 DIAGNOSIS — Z8261 Family history of arthritis: Secondary | ICD-10-CM | POA: Diagnosis not present

## 2021-03-09 DIAGNOSIS — Z8269 Family history of other diseases of the musculoskeletal system and connective tissue: Secondary | ICD-10-CM | POA: Diagnosis not present

## 2021-03-09 DIAGNOSIS — E669 Obesity, unspecified: Secondary | ICD-10-CM | POA: Diagnosis not present

## 2021-03-09 DIAGNOSIS — G479 Sleep disorder, unspecified: Secondary | ICD-10-CM | POA: Diagnosis not present

## 2021-03-09 DIAGNOSIS — Z5189 Encounter for other specified aftercare: Secondary | ICD-10-CM | POA: Diagnosis not present

## 2021-03-09 DIAGNOSIS — R590 Localized enlarged lymph nodes: Secondary | ICD-10-CM | POA: Diagnosis not present

## 2021-03-09 DIAGNOSIS — R200 Anesthesia of skin: Secondary | ICD-10-CM | POA: Diagnosis not present

## 2021-03-09 DIAGNOSIS — R5383 Other fatigue: Secondary | ICD-10-CM | POA: Diagnosis not present

## 2021-03-09 DIAGNOSIS — Z79899 Other long term (current) drug therapy: Secondary | ICD-10-CM | POA: Diagnosis not present

## 2021-03-09 DIAGNOSIS — Z5111 Encounter for antineoplastic chemotherapy: Secondary | ICD-10-CM | POA: Diagnosis not present

## 2021-03-09 DIAGNOSIS — Z95828 Presence of other vascular implants and grafts: Secondary | ICD-10-CM

## 2021-03-09 LAB — CBC WITH DIFFERENTIAL/PLATELET
Abs Immature Granulocytes: 0.07 10*3/uL (ref 0.00–0.07)
Basophils Absolute: 0.1 10*3/uL (ref 0.0–0.1)
Basophils Relative: 1 %
Eosinophils Absolute: 0.3 10*3/uL (ref 0.0–0.5)
Eosinophils Relative: 3 %
HCT: 46 % (ref 39.0–52.0)
Hemoglobin: 15.4 g/dL (ref 13.0–17.0)
Immature Granulocytes: 1 %
Lymphocytes Relative: 15 %
Lymphs Abs: 1.3 10*3/uL (ref 0.7–4.0)
MCH: 30.8 pg (ref 26.0–34.0)
MCHC: 33.5 g/dL (ref 30.0–36.0)
MCV: 92 fL (ref 80.0–100.0)
Monocytes Absolute: 0.6 10*3/uL (ref 0.1–1.0)
Monocytes Relative: 7 %
Neutro Abs: 6.7 10*3/uL (ref 1.7–7.7)
Neutrophils Relative %: 73 %
Platelets: 142 10*3/uL — ABNORMAL LOW (ref 150–400)
RBC: 5 MIL/uL (ref 4.22–5.81)
RDW: 19 % — ABNORMAL HIGH (ref 11.5–15.5)
WBC: 9.1 10*3/uL (ref 4.0–10.5)
nRBC: 0 % (ref 0.0–0.2)

## 2021-03-09 MED ORDER — OXALIPLATIN CHEMO INJECTION 100 MG/20ML
85.0000 mg/m2 | Freq: Once | INTRAVENOUS | Status: AC
  Administered 2021-03-09: 190 mg via INTRAVENOUS
  Filled 2021-03-09: qty 38

## 2021-03-09 MED ORDER — HEPARIN SOD (PORK) LOCK FLUSH 100 UNIT/ML IV SOLN: 500.0000 [IU] | Freq: Once | INTRAVENOUS | Status: DC | PRN

## 2021-03-09 MED ORDER — SODIUM CHLORIDE 0.9 % IV SOLN
5000.0000 mg | Freq: Once | INTRAVENOUS | Status: DC
Start: 2021-03-09 — End: 2021-03-09
  Administered 2021-03-09: 5000 mg via INTRAVENOUS
  Filled 2021-03-09: qty 100

## 2021-03-09 MED ORDER — FAMOTIDINE 20 MG IN NS 100 ML IVPB
20.0000 mg | Freq: Once | INTRAVENOUS | Status: AC
  Administered 2021-03-09: 20 mg via INTRAVENOUS
  Filled 2021-03-09: qty 20

## 2021-03-09 MED ORDER — DEXTROSE 5 % IV SOLN: Freq: Once | INTRAVENOUS | Status: AC

## 2021-03-09 MED ORDER — FLUOROURACIL CHEMO INJECTION 5 GM/100ML: 2400.0000 mg/m2 | INTRAVENOUS | Status: DC

## 2021-03-09 MED ORDER — LEUCOVORIN CALCIUM INJECTION 350 MG
400.0000 mg/m2 | Freq: Once | INTRAVENOUS | Status: AC
  Administered 2021-03-09: 892 mg via INTRAVENOUS
  Filled 2021-03-09: qty 44.6

## 2021-03-09 MED ORDER — PALONOSETRON HCL INJECTION 0.25 MG/5ML
0.2500 mg | Freq: Once | INTRAVENOUS | Status: AC
  Administered 2021-03-09: 0.25 mg via INTRAVENOUS
  Filled 2021-03-09: qty 5

## 2021-03-09 MED ORDER — SODIUM CHLORIDE 0.9% FLUSH: 10.0000 mL | INTRAVENOUS | Status: DC | PRN

## 2021-03-09 MED ORDER — FLUOROURACIL CHEMO INJECTION 2.5 GM/50ML
400.0000 mg/m2 | Freq: Once | INTRAVENOUS | Status: AC
  Administered 2021-03-09: 900 mg via INTRAVENOUS
  Filled 2021-03-09: qty 18

## 2021-03-09 MED ORDER — DIPHENHYDRAMINE HCL 50 MG/ML IJ SOLN
50.0000 mg | Freq: Once | INTRAMUSCULAR | Status: AC
  Administered 2021-03-09: 50 mg via INTRAVENOUS
  Filled 2021-03-09: qty 1

## 2021-03-09 MED ORDER — SODIUM CHLORIDE 0.9 % IV SOLN
10.0000 mg | Freq: Once | INTRAVENOUS | Status: AC
  Administered 2021-03-09: 10 mg via INTRAVENOUS
  Filled 2021-03-09: qty 10

## 2021-03-09 NOTE — Progress Notes (Signed)
Patient stated he had a "dull ache" in the upper right arm muscle and rated two on the pain scale.  Denied radiating pain, chest pain, SOB, and tingling sensation in fingers. No s/s of distress noted.   Reviewed with Dr. Delton Coombes and came in to assess the patient.  Ok to start chemotherapy treatment verbal order Dr. Delton Coombes.    Patient tolerated chemotherapy with no complaints voiced.  Side effects with management reviewed with understanding verbalized.  Port site clean and dry with no bruising or swelling noted at site.  Good blood return noted before and after administration of chemotherapy.  Chemotherapy pump connected with no alarms noted.  No complaints with right arm.   Patient left in satisfactory condition with VSS and no s/s of distress noted.

## 2021-03-09 NOTE — Patient Instructions (Signed)
[facility]  Discharge Instructions: Thank you for choosing [facility] to provide your oncology and hematology care.  If you have a lab appointment with the [facility], please come in thru the Main Entrance and check in at the main information desk.  Wear comfortable clothing and clothing appropriate for easy access to any Portacath or PICC line.   We strive to give you quality time with your provider. You may need to reschedule your appointment if you arrive late (15 or more minutes).  Arriving late affects you and other patients whose appointments are after yours.  Also, if you miss three or more appointments without notifying the office, you may be dismissed from the clinic at the provider's discretion.      For prescription refill requests, have your pharmacy contact our office and allow 72 hours for refills to be completed.    Today you received the following chemotherapy and/or immunotherapy agents oxaliplatin, leucovorin, adruicil.       To help prevent nausea and vomiting after your treatment, we encourage you to take your nausea medication as directed.  BELOW ARE SYMPTOMS THAT SHOULD BE REPORTED IMMEDIATELY: . *FEVER GREATER THAN 100.4 F (38 C) OR HIGHER . *CHILLS OR SWEATING . *NAUSEA AND VOMITING THAT IS NOT CONTROLLED WITH YOUR NAUSEA MEDICATION . *UNUSUAL SHORTNESS OF BREATH . *UNUSUAL BRUISING OR BLEEDING . *URINARY PROBLEMS (pain or burning when urinating, or frequent urination) . *BOWEL PROBLEMS (unusual diarrhea, constipation, pain near the anus) . TENDERNESS IN MOUTH AND THROAT WITH OR WITHOUT PRESENCE OF ULCERS (sore throat, sores in mouth, or a toothache) . UNUSUAL RASH, SWELLING OR PAIN  . UNUSUAL VAGINAL DISCHARGE OR ITCHING   Items with * indicate a potential emergency and should be followed up as soon as possible or go to the Emergency Department if any problems should occur.  Please show the CHEMOTHERAPY ALERT CARD or IMMUNOTHERAPY  ALERT CARD at check-in to the Emergency Department and triage nurse.  Should you have questions after your visit or need to cancel or reschedule your appointment, please contact [facility] [phone]  and follow the prompts.  Office hours are 8:00 a.m. to 4:30 p.m. Monday - Friday. Please note that voicemails left after 4:00 p.m. may not be returned until the following business day.  We are closed weekends and major holidays. You have access to a nurse at all times for urgent questions. Please call the main number to the clinic [phone] and follow the prompts.  For any non-urgent questions, you may also contact your provider using MyChart. We now offer e-Visits for anyone 60 and older to request care online for non-urgent symptoms. For details visit [email or web address].   Also download the MyChart app! Go to the app store, search "MyChart", open the app, select [facility], and log in with your MyChart username and password.  Due to Covid, a mask is required upon entering the hospital/clinic. If you do not have a mask, one will be given to you upon arrival. For doctor visits, patients may have 1 support person aged 60 or older with them. For treatment visits, patients cannot have anyone with them due to current Covid guidelines and our immunocompromised population.

## 2021-03-10 ENCOUNTER — Encounter (HOSPITAL_COMMUNITY): Payer: BLUE CROSS/BLUE SHIELD

## 2021-03-11 ENCOUNTER — Inpatient Hospital Stay (HOSPITAL_COMMUNITY): Payer: BLUE CROSS/BLUE SHIELD

## 2021-03-11 ENCOUNTER — Other Ambulatory Visit: Payer: Self-pay

## 2021-03-11 VITALS — BP 125/84 | HR 82 | Temp 97.8°F | Resp 18

## 2021-03-11 DIAGNOSIS — Z5189 Encounter for other specified aftercare: Secondary | ICD-10-CM | POA: Diagnosis not present

## 2021-03-11 DIAGNOSIS — C182 Malignant neoplasm of ascending colon: Secondary | ICD-10-CM

## 2021-03-11 DIAGNOSIS — Z8269 Family history of other diseases of the musculoskeletal system and connective tissue: Secondary | ICD-10-CM | POA: Diagnosis not present

## 2021-03-11 DIAGNOSIS — R5383 Other fatigue: Secondary | ICD-10-CM | POA: Diagnosis not present

## 2021-03-11 DIAGNOSIS — R590 Localized enlarged lymph nodes: Secondary | ICD-10-CM | POA: Diagnosis not present

## 2021-03-11 DIAGNOSIS — Z5111 Encounter for antineoplastic chemotherapy: Secondary | ICD-10-CM | POA: Diagnosis not present

## 2021-03-11 DIAGNOSIS — Z832 Family history of diseases of the blood and blood-forming organs and certain disorders involving the immune mechanism: Secondary | ICD-10-CM | POA: Diagnosis not present

## 2021-03-11 DIAGNOSIS — Z79899 Other long term (current) drug therapy: Secondary | ICD-10-CM | POA: Diagnosis not present

## 2021-03-11 DIAGNOSIS — E669 Obesity, unspecified: Secondary | ICD-10-CM | POA: Diagnosis not present

## 2021-03-11 DIAGNOSIS — G479 Sleep disorder, unspecified: Secondary | ICD-10-CM | POA: Diagnosis not present

## 2021-03-11 DIAGNOSIS — Z8261 Family history of arthritis: Secondary | ICD-10-CM | POA: Diagnosis not present

## 2021-03-11 DIAGNOSIS — R200 Anesthesia of skin: Secondary | ICD-10-CM | POA: Diagnosis not present

## 2021-03-11 DIAGNOSIS — Z95828 Presence of other vascular implants and grafts: Secondary | ICD-10-CM

## 2021-03-11 MED ORDER — PEGFILGRASTIM-CBQV 6 MG/0.6ML ~~LOC~~ SOSY
6.0000 mg | PREFILLED_SYRINGE | Freq: Once | SUBCUTANEOUS | Status: AC
Start: 2021-03-11 — End: 2021-03-11
  Administered 2021-03-11: 6 mg via SUBCUTANEOUS

## 2021-03-11 MED ORDER — HEPARIN SOD (PORK) LOCK FLUSH 100 UNIT/ML IV SOLN
500.0000 [IU] | Freq: Once | INTRAVENOUS | Status: AC | PRN
Start: 2021-03-11 — End: 2021-03-11
  Administered 2021-03-11: 500 [IU]

## 2021-03-11 MED ORDER — SODIUM CHLORIDE 0.9% FLUSH
10.0000 mL | INTRAVENOUS | Status: DC | PRN
  Administered 2021-03-11: 10 mL

## 2021-03-11 NOTE — Patient Instructions (Signed)
[facility]  Discharge Instructions: Thank you for choosing [facility] to provide your oncology and hematology care.  If you have a lab appointment with the Cancer Center, please come in thru the Main Entrance and check in at the main information desk.  Wear comfortable clothing and clothing appropriate for easy access to any Portacath or PICC line.   We strive to give you quality time with your provider. You may need to reschedule your appointment if you arrive late (15 or more minutes).  Arriving late affects you and other patients whose appointments are after yours.  Also, if you miss three or more appointments without notifying the office, you may be dismissed from the clinic at the provider's discretion.      For prescription refill requests, have your pharmacy contact our office and allow 72 hours for refills to be completed.       To help prevent nausea and vomiting after your treatment, we encourage you to take your nausea medication as directed.   Items with * indicate a potential emergency and should be followed up as soon as possible or go to the Emergency Department if any problems should occur.    Should you have questions after your visit or need to cancel or reschedule your appointment, please contact [facility] [phone]  and follow the prompts.  Office hours are 8:00 a.m. to 4:30 p.m. Monday - Friday. Please note that voicemails left after 4:00 p.m. may not be returned until the following business day.  We are closed weekends and major holidays. You have access to a nurse at all times for urgent questions. Please call the main number to the clinic [phone] and follow the prompts.  For any non-urgent questions, you may also contact your provider using MyChart. We now offer e-Visits for anyone 18 and older to request care online for non-urgent symptoms. For details visit [email or web address].   Also download the MyChart app! Go  to the app store, search "MyChart", open the app, select [facility], and log in with your MyChart username and password.  Due to Covid, a mask is required upon entering the hospital/clinic. If you do not have a mask, one will be given to you upon arrival. For doctor visits, patients may have 1 support person aged 18 or older with them. For treatment visits, patients cannot have anyone with them due to current Covid guidelines and our immunocompromised population.  
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
## 2021-03-23 ENCOUNTER — Other Ambulatory Visit: Payer: Self-pay

## 2021-03-23 ENCOUNTER — Inpatient Hospital Stay (HOSPITAL_COMMUNITY): Payer: BLUE CROSS/BLUE SHIELD

## 2021-03-23 ENCOUNTER — Encounter (HOSPITAL_COMMUNITY): Payer: Self-pay

## 2021-03-23 ENCOUNTER — Inpatient Hospital Stay (HOSPITAL_BASED_OUTPATIENT_CLINIC_OR_DEPARTMENT_OTHER): Payer: BLUE CROSS/BLUE SHIELD | Admitting: Hematology

## 2021-03-23 VITALS — BP 128/80 | HR 73 | Temp 98.1°F | Resp 16

## 2021-03-23 VITALS — BP 138/91 | HR 93 | Temp 97.2°F | Resp 18 | Wt 224.0 lb

## 2021-03-23 DIAGNOSIS — Z8261 Family history of arthritis: Secondary | ICD-10-CM | POA: Diagnosis not present

## 2021-03-23 DIAGNOSIS — Z79899 Other long term (current) drug therapy: Secondary | ICD-10-CM | POA: Diagnosis not present

## 2021-03-23 DIAGNOSIS — C182 Malignant neoplasm of ascending colon: Secondary | ICD-10-CM

## 2021-03-23 DIAGNOSIS — R590 Localized enlarged lymph nodes: Secondary | ICD-10-CM | POA: Diagnosis not present

## 2021-03-23 DIAGNOSIS — Z95828 Presence of other vascular implants and grafts: Secondary | ICD-10-CM

## 2021-03-23 DIAGNOSIS — G479 Sleep disorder, unspecified: Secondary | ICD-10-CM | POA: Diagnosis not present

## 2021-03-23 DIAGNOSIS — Z5189 Encounter for other specified aftercare: Secondary | ICD-10-CM | POA: Diagnosis not present

## 2021-03-23 DIAGNOSIS — Z5111 Encounter for antineoplastic chemotherapy: Secondary | ICD-10-CM | POA: Diagnosis not present

## 2021-03-23 DIAGNOSIS — D702 Other drug-induced agranulocytosis: Secondary | ICD-10-CM

## 2021-03-23 DIAGNOSIS — R5383 Other fatigue: Secondary | ICD-10-CM | POA: Diagnosis not present

## 2021-03-23 DIAGNOSIS — R200 Anesthesia of skin: Secondary | ICD-10-CM | POA: Diagnosis not present

## 2021-03-23 DIAGNOSIS — E669 Obesity, unspecified: Secondary | ICD-10-CM | POA: Diagnosis not present

## 2021-03-23 DIAGNOSIS — Z8269 Family history of other diseases of the musculoskeletal system and connective tissue: Secondary | ICD-10-CM | POA: Diagnosis not present

## 2021-03-23 DIAGNOSIS — Z832 Family history of diseases of the blood and blood-forming organs and certain disorders involving the immune mechanism: Secondary | ICD-10-CM | POA: Diagnosis not present

## 2021-03-23 LAB — CBC WITH DIFFERENTIAL/PLATELET
Abs Immature Granulocytes: 0.27 10*3/uL — ABNORMAL HIGH (ref 0.00–0.07)
Basophils Absolute: 0.1 10*3/uL (ref 0.0–0.1)
Basophils Relative: 2 %
Eosinophils Absolute: 0.1 10*3/uL (ref 0.0–0.5)
Eosinophils Relative: 3 %
HCT: 45.6 % (ref 39.0–52.0)
Hemoglobin: 15.5 g/dL (ref 13.0–17.0)
Immature Granulocytes: 7 %
Lymphocytes Relative: 32 %
Lymphs Abs: 1.3 10*3/uL (ref 0.7–4.0)
MCH: 31.6 pg (ref 26.0–34.0)
MCHC: 34 g/dL (ref 30.0–36.0)
MCV: 93.1 fL (ref 80.0–100.0)
Monocytes Absolute: 0.4 10*3/uL (ref 0.1–1.0)
Monocytes Relative: 10 %
Neutro Abs: 1.8 10*3/uL (ref 1.7–7.7)
Neutrophils Relative %: 46 %
Platelets: 133 10*3/uL — ABNORMAL LOW (ref 150–400)
RBC: 4.9 MIL/uL (ref 4.22–5.81)
RDW: 19.7 % — ABNORMAL HIGH (ref 11.5–15.5)
WBC: 4 10*3/uL (ref 4.0–10.5)
nRBC: 0 % (ref 0.0–0.2)

## 2021-03-23 LAB — COMPREHENSIVE METABOLIC PANEL
ALT: 34 U/L (ref 0–44)
AST: 32 U/L (ref 15–41)
Albumin: 3.8 g/dL (ref 3.5–5.0)
Alkaline Phosphatase: 105 U/L (ref 38–126)
Anion gap: 7 (ref 5–15)
BUN: 9 mg/dL (ref 6–20)
CO2: 28 mmol/L (ref 22–32)
Calcium: 9 mg/dL (ref 8.9–10.3)
Chloride: 107 mmol/L (ref 98–111)
Creatinine, Ser: 0.94 mg/dL (ref 0.61–1.24)
GFR, Estimated: >60 mL/min (ref >60)
Glucose, Bld: 135 mg/dL — ABNORMAL HIGH (ref 70–99)
Potassium: 3.3 mmol/L — ABNORMAL LOW (ref 3.5–5.1)
Sodium: 142 mmol/L (ref 135–145)
Total Bilirubin: 0.5 mg/dL (ref 0.3–1.2)
Total Protein: 6.6 g/dL (ref 6.5–8.1)

## 2021-03-23 LAB — MAGNESIUM: Magnesium: 2.1 mg/dL (ref 1.7–2.4)

## 2021-03-23 MED ORDER — DEXTROSE 5 % IV SOLN: Freq: Once | INTRAVENOUS | Status: AC

## 2021-03-23 MED ORDER — FLUOROURACIL CHEMO INJECTION 2.5 GM/50ML
400.0000 mg/m2 | Freq: Once | INTRAVENOUS | Status: AC
  Administered 2021-03-23: 900 mg via INTRAVENOUS
  Filled 2021-03-23: qty 18

## 2021-03-23 MED ORDER — SODIUM CHLORIDE 0.9% FLUSH
10.0000 mL | INTRAVENOUS | Status: DC | PRN
  Administered 2021-03-23: 10 mL

## 2021-03-23 MED ORDER — SODIUM CHLORIDE 0.9 % IV SOLN
10.0000 mg | Freq: Once | INTRAVENOUS | Status: AC
  Administered 2021-03-23: 10 mg via INTRAVENOUS
  Filled 2021-03-23: qty 10

## 2021-03-23 MED ORDER — PALONOSETRON HCL INJECTION 0.25 MG/5ML
0.2500 mg | Freq: Once | INTRAVENOUS | Status: AC
Start: 2021-03-23 — End: 2021-03-23
  Administered 2021-03-23: 0.25 mg via INTRAVENOUS
  Filled 2021-03-23: qty 5

## 2021-03-23 MED ORDER — DIPHENHYDRAMINE HCL 50 MG/ML IJ SOLN
50.0000 mg | Freq: Once | INTRAMUSCULAR | Status: AC
Start: 2021-03-23 — End: 2021-03-23
  Administered 2021-03-23: 50 mg via INTRAVENOUS
  Filled 2021-03-23: qty 1

## 2021-03-23 MED ORDER — HEPARIN SOD (PORK) LOCK FLUSH 100 UNIT/ML IV SOLN: 500.0000 [IU] | Freq: Once | INTRAVENOUS | Status: DC | PRN

## 2021-03-23 MED ORDER — SODIUM CHLORIDE 0.9 % IV SOLN
5000.0000 mg | INTRAVENOUS | Status: DC
  Administered 2021-03-23: 5000 mg via INTRAVENOUS
  Filled 2021-03-23: qty 100

## 2021-03-23 MED ORDER — LEUCOVORIN CALCIUM INJECTION 350 MG
400.0000 mg/m2 | Freq: Once | INTRAVENOUS | Status: AC
  Administered 2021-03-23: 892 mg via INTRAVENOUS
  Filled 2021-03-23: qty 44.6

## 2021-03-23 MED ORDER — OXALIPLATIN CHEMO INJECTION 100 MG/20ML
85.0000 mg/m2 | Freq: Once | INTRAVENOUS | Status: AC
  Administered 2021-03-23: 190 mg via INTRAVENOUS
  Filled 2021-03-23: qty 38

## 2021-03-23 MED ORDER — POTASSIUM CHLORIDE CRYS ER 20 MEQ PO TBCR
20.0000 meq | EXTENDED_RELEASE_TABLET | Freq: Once | ORAL | Status: AC
Start: 2021-03-23 — End: 2021-03-23
  Administered 2021-03-23: 20 meq via ORAL
  Filled 2021-03-23: qty 1

## 2021-03-23 MED ORDER — FAMOTIDINE 20 MG IN NS 100 ML IVPB
20.0000 mg | Freq: Once | INTRAVENOUS | Status: AC
  Administered 2021-03-23: 20 mg via INTRAVENOUS
  Filled 2021-03-23: qty 20

## 2021-03-23 NOTE — Progress Notes (Signed)
Patient was assessed by Dr. Delton Coombes and labs and vital signs have been reviewed.  K+ 3.3 and will give 20 meq K Dur po x 1 today.  Otherwise no changes to plan.  Patient is okay to proceed with treatment today. Primary RN and pharmacy aware.

## 2021-03-23 NOTE — Progress Notes (Signed)
Patient tolerated chemotherapy with no complaints voiced. Side effects with management reviewed understanding verbalized. 5FU pump connected with no alarms noted. Band aid applied. Patient left in satisfactory condition with VSS and no s/s of distress noted.

## 2021-03-23 NOTE — Patient Instructions (Signed)
[facility]  Discharge Instructions: Thank you for choosing [facility] to provide your oncology and hematology care.  If you have a lab appointment with the [facility], please come in thru the Main Entrance and check in at the main information desk.  Wear comfortable clothing and clothing appropriate for easy access to any Portacath or PICC line.   We strive to give you quality time with your provider. You may need to reschedule your appointment if you arrive late (15 or more minutes).  Arriving late affects you and other patients whose appointments are after yours.  Also, if you miss three or more appointments without notifying the office, you may be dismissed from the clinic at the provider's discretion.      For prescription refill requests, have your pharmacy contact our office and allow 72 hours for refills to be completed.    Today you received the following chemotherapy and/or immunotherapy agents Oxaliplatin, Leucovorin, & fluorouracil, return Friday for pump d/c.   To help prevent nausea and vomiting after your treatment, we encourage you to take your nausea medication as directed.  BELOW ARE SYMPTOMS THAT SHOULD BE REPORTED IMMEDIATELY: . *FEVER GREATER THAN 100.4 F (38 C) OR HIGHER . *CHILLS OR SWEATING . *NAUSEA AND VOMITING THAT IS NOT CONTROLLED WITH YOUR NAUSEA MEDICATION . *UNUSUAL SHORTNESS OF BREATH . *UNUSUAL BRUISING OR BLEEDING . *URINARY PROBLEMS (pain or burning when urinating, or frequent urination) . *BOWEL PROBLEMS (unusual diarrhea, constipation, pain near the anus) . TENDERNESS IN MOUTH AND THROAT WITH OR WITHOUT PRESENCE OF ULCERS (sore throat, sores in mouth, or a toothache) . UNUSUAL RASH, SWELLING OR PAIN  . UNUSUAL VAGINAL DISCHARGE OR ITCHING   Items with * indicate a potential emergency and should be followed up as soon as possible or go to the Emergency Department if any problems should occur.  Please show the  CHEMOTHERAPY ALERT CARD or IMMUNOTHERAPY ALERT CARD at check-in to the Emergency Department and triage nurse.  Should you have questions after your visit or need to cancel or reschedule your appointment, please contact [facility] [phone]  and follow the prompts.  Office hours are 8:00 a.m. to 4:30 p.m. Monday - Friday. Please note that voicemails left after 4:00 p.m. may not be returned until the following business day.  We are closed weekends and major holidays. You have access to a nurse at all times for urgent questions. Please call the main number to the clinic [phone] and follow the prompts.  For any non-urgent questions, you may also contact your provider using MyChart. We now offer e-Visits for anyone 67 and older to request care online for non-urgent symptoms. For details visit [email or web address].   Also download the MyChart app! Go to the app store, search "MyChart", open the app, select [facility], and log in with your MyChart username and password.  Due to Covid, a mask is required upon entering the hospital/clinic. If you do not have a mask, one will be given to you upon arrival. For doctor visits, patients may have 1 support person aged 67 or older with them. For treatment visits, patients cannot have anyone with them due to current Covid guidelines and our immunocompromised population.

## 2021-03-23 NOTE — Patient Instructions (Signed)
[facility] Discharge Instructions  You were seen today by Dr. Katragadda. He went over your recent results, and you received treatment. Dr. Katragadda will see you back in 2 weeks for labs and follow up.   Thank you for choosing [facility] to provide your oncology and hematology care.  To afford each patient quality time with our provider, please arrive at least 15 minutes before your scheduled appointment time.   If you have a lab appointment with the Cancer Center please come in thru the Main Entrance and check in at the main information desk  You need to re-schedule your appointment should you arrive 10 or more minutes late.  We strive to give you quality time with our providers, and arriving late affects you and other patients whose appointments are after yours.  Also, if you no show three or more times for appointments you may be dismissed from the clinic at the providers discretion.     Again, thank you for choosing [facility].  Our hope is that these requests will decrease the amount of time that you wait before being seen by our physicians.       _____________________________________________________________  Should you have questions after your visit to [facility], please contact our office at [phone] between the hours of 8:00 a.m. and 4:30 p.m.  Voicemails left after 4:00 p.m. will not be returned until the following business day.  For prescription refill requests, have your pharmacy contact our office and allow 72 hours.    Cancer Center Support Programs:   > Cancer Support Group  2nd Tuesday of the month 1pm-2pm, Journey Room    

## 2021-03-24 ENCOUNTER — Other Ambulatory Visit: Payer: Self-pay | Admitting: Family Medicine

## 2021-03-24 DIAGNOSIS — E785 Hyperlipidemia, unspecified: Secondary | ICD-10-CM

## 2021-03-25 ENCOUNTER — Inpatient Hospital Stay (HOSPITAL_COMMUNITY): Payer: BLUE CROSS/BLUE SHIELD

## 2021-03-25 ENCOUNTER — Other Ambulatory Visit: Payer: Self-pay

## 2021-03-25 VITALS — BP 129/80 | HR 95 | Temp 97.8°F | Resp 19

## 2021-03-25 DIAGNOSIS — Z5189 Encounter for other specified aftercare: Secondary | ICD-10-CM | POA: Diagnosis not present

## 2021-03-25 DIAGNOSIS — E669 Obesity, unspecified: Secondary | ICD-10-CM | POA: Diagnosis not present

## 2021-03-25 DIAGNOSIS — Z8261 Family history of arthritis: Secondary | ICD-10-CM | POA: Diagnosis not present

## 2021-03-25 DIAGNOSIS — R200 Anesthesia of skin: Secondary | ICD-10-CM | POA: Diagnosis not present

## 2021-03-25 DIAGNOSIS — Z95828 Presence of other vascular implants and grafts: Secondary | ICD-10-CM

## 2021-03-25 DIAGNOSIS — Z5111 Encounter for antineoplastic chemotherapy: Secondary | ICD-10-CM | POA: Diagnosis not present

## 2021-03-25 DIAGNOSIS — Z79899 Other long term (current) drug therapy: Secondary | ICD-10-CM | POA: Diagnosis not present

## 2021-03-25 DIAGNOSIS — G479 Sleep disorder, unspecified: Secondary | ICD-10-CM | POA: Diagnosis not present

## 2021-03-25 DIAGNOSIS — C182 Malignant neoplasm of ascending colon: Secondary | ICD-10-CM

## 2021-03-25 DIAGNOSIS — R5383 Other fatigue: Secondary | ICD-10-CM | POA: Diagnosis not present

## 2021-03-25 DIAGNOSIS — R590 Localized enlarged lymph nodes: Secondary | ICD-10-CM | POA: Diagnosis not present

## 2021-03-25 DIAGNOSIS — Z832 Family history of diseases of the blood and blood-forming organs and certain disorders involving the immune mechanism: Secondary | ICD-10-CM | POA: Diagnosis not present

## 2021-03-25 DIAGNOSIS — Z8269 Family history of other diseases of the musculoskeletal system and connective tissue: Secondary | ICD-10-CM | POA: Diagnosis not present

## 2021-03-25 MED ORDER — PEGFILGRASTIM-CBQV 6 MG/0.6ML ~~LOC~~ SOSY
6.0000 mg | PREFILLED_SYRINGE | Freq: Once | SUBCUTANEOUS | Status: AC
  Administered 2021-03-25: 6 mg via SUBCUTANEOUS

## 2021-03-25 MED ORDER — HEPARIN SOD (PORK) LOCK FLUSH 100 UNIT/ML IV SOLN
500.0000 [IU] | Freq: Once | INTRAVENOUS | Status: AC | PRN
  Administered 2021-03-25: 500 [IU]

## 2021-03-25 MED ORDER — PEGFILGRASTIM-CBQV 6 MG/0.6ML ~~LOC~~ SOSY
PREFILLED_SYRINGE | SUBCUTANEOUS | Status: AC
  Filled 2021-03-25: qty 0.6

## 2021-03-25 MED ORDER — SODIUM CHLORIDE 0.9% FLUSH
10.0000 mL | INTRAVENOUS | Status: DC | PRN
  Administered 2021-03-25: 10 mL

## 2021-03-25 NOTE — Progress Notes (Signed)
Carlos Miranda presents today for PORT de access after 46 hours continuous infusion of 5FU.  Tolerated infusion without problems.  Patients port flushed without difficulty.  Good blood return noted with no bruising or swelling noted at site.  Band aid applied.  VSS.   Udenyca administration per MD order without incident; injection site WNL; see MAR for injection details.  Patient tolerated procedure well and without incident.  No questions or complaints noted at this time.  Discharge from clinic ambulatory in stable condition.  Alert and oriented X 3.  Follow up with [facility] as scheduled.

## 2021-03-25 NOTE — Patient Instructions (Signed)
[facility]  Discharge Instructions: Thank you for choosing [facility] to provide your oncology and hematology care.  If you have a lab appointment with the Cancer Center, please come in thru the Main Entrance and check in at the main information desk.  Wear comfortable clothing and clothing appropriate for easy access to any Portacath or PICC line.   We strive to give you quality time with your provider. You may need to reschedule your appointment if you arrive late (15 or more minutes).  Arriving late affects you and other patients whose appointments are after yours.  Also, if you miss three or more appointments without notifying the office, you may be dismissed from the clinic at the provider's discretion.      For prescription refill requests, have your pharmacy contact our office and allow 72 hours for refills to be completed.        To help prevent nausea and vomiting after your treatment, we encourage you to take your nausea medication as directed.  BELOW ARE SYMPTOMS THAT SHOULD BE REPORTED IMMEDIATELY: *FEVER GREATER THAN 100.4 F (38 C) OR HIGHER *CHILLS OR SWEATING *NAUSEA AND VOMITING THAT IS NOT CONTROLLED WITH YOUR NAUSEA MEDICATION *UNUSUAL SHORTNESS OF BREATH *UNUSUAL BRUISING OR BLEEDING *URINARY PROBLEMS (pain or burning when urinating, or frequent urination) *BOWEL PROBLEMS (unusual diarrhea, constipation, pain near the anus) TENDERNESS IN MOUTH AND THROAT WITH OR WITHOUT PRESENCE OF ULCERS (sore throat, sores in mouth, or a toothache) UNUSUAL RASH, SWELLING OR PAIN  UNUSUAL VAGINAL DISCHARGE OR ITCHING   Items with * indicate a potential emergency and should be followed up as soon as possible or go to the Emergency Department if any problems should occur.  Please show the CHEMOTHERAPY ALERT CARD or IMMUNOTHERAPY ALERT CARD at check-in to the Emergency Department and triage nurse.  Should you have questions after your visit or need to cancel  or reschedule your appointment, please contact [facility] [phone]  and follow the prompts.  Office hours are 8:00 a.m. to 4:30 p.m. Monday - Friday. Please note that voicemails left after 4:00 p.m. may not be returned until the following business day.  We are closed weekends and major holidays. You have access to a nurse at all times for urgent questions. Please call the main number to the clinic [phone] and follow the prompts.  For any non-urgent questions, you may also contact your provider using MyChart. We now offer e-Visits for anyone 18 and older to request care online for non-urgent symptoms. For details visit [email or web address].   Also download the MyChart app! Go to the app store, search "MyChart", open the app, select [facility], and log in with your MyChart username and password.  Due to Covid, a mask is required upon entering the hospital/clinic. If you do not have a mask, one will be given to you upon arrival. For doctor visits, patients may have 1 support person aged 18 or older with them. For treatment visits, patients cannot have anyone with them due to current Covid guidelines and our immunocompromised population.  

## 2021-03-27 DIAGNOSIS — C182 Malignant neoplasm of ascending colon: Secondary | ICD-10-CM | POA: Diagnosis not present

## 2021-03-28 ENCOUNTER — Encounter (HOSPITAL_COMMUNITY): Payer: Self-pay | Admitting: Hematology

## 2021-04-01 ENCOUNTER — Encounter: Payer: BLUE CROSS/BLUE SHIELD | Admitting: Family Medicine

## 2021-04-06 ENCOUNTER — Encounter (HOSPITAL_COMMUNITY): Payer: Self-pay | Admitting: Hematology and Oncology

## 2021-04-06 ENCOUNTER — Inpatient Hospital Stay (HOSPITAL_BASED_OUTPATIENT_CLINIC_OR_DEPARTMENT_OTHER): Payer: BLUE CROSS/BLUE SHIELD | Admitting: Hematology and Oncology

## 2021-04-06 ENCOUNTER — Inpatient Hospital Stay (HOSPITAL_COMMUNITY): Payer: BLUE CROSS/BLUE SHIELD | Attending: Hematology

## 2021-04-06 ENCOUNTER — Inpatient Hospital Stay (HOSPITAL_COMMUNITY): Payer: BLUE CROSS/BLUE SHIELD

## 2021-04-06 VITALS — BP 125/78 | HR 73 | Temp 97.9°F | Resp 16

## 2021-04-06 DIAGNOSIS — K521 Toxic gastroenteritis and colitis: Secondary | ICD-10-CM

## 2021-04-06 DIAGNOSIS — R197 Diarrhea, unspecified: Secondary | ICD-10-CM | POA: Insufficient documentation

## 2021-04-06 DIAGNOSIS — E876 Hypokalemia: Secondary | ICD-10-CM | POA: Insufficient documentation

## 2021-04-06 DIAGNOSIS — C182 Malignant neoplasm of ascending colon: Secondary | ICD-10-CM | POA: Insufficient documentation

## 2021-04-06 DIAGNOSIS — E785 Hyperlipidemia, unspecified: Secondary | ICD-10-CM | POA: Insufficient documentation

## 2021-04-06 DIAGNOSIS — Z832 Family history of diseases of the blood and blood-forming organs and certain disorders involving the immune mechanism: Secondary | ICD-10-CM | POA: Diagnosis not present

## 2021-04-06 DIAGNOSIS — R202 Paresthesia of skin: Secondary | ICD-10-CM | POA: Diagnosis not present

## 2021-04-06 DIAGNOSIS — Z5111 Encounter for antineoplastic chemotherapy: Secondary | ICD-10-CM | POA: Insufficient documentation

## 2021-04-06 DIAGNOSIS — R5383 Other fatigue: Secondary | ICD-10-CM | POA: Insufficient documentation

## 2021-04-06 DIAGNOSIS — Z79899 Other long term (current) drug therapy: Secondary | ICD-10-CM | POA: Insufficient documentation

## 2021-04-06 DIAGNOSIS — Z8269 Family history of other diseases of the musculoskeletal system and connective tissue: Secondary | ICD-10-CM | POA: Diagnosis not present

## 2021-04-06 DIAGNOSIS — Z5189 Encounter for other specified aftercare: Secondary | ICD-10-CM | POA: Insufficient documentation

## 2021-04-06 DIAGNOSIS — Z8249 Family history of ischemic heart disease and other diseases of the circulatory system: Secondary | ICD-10-CM | POA: Insufficient documentation

## 2021-04-06 DIAGNOSIS — Z8261 Family history of arthritis: Secondary | ICD-10-CM | POA: Insufficient documentation

## 2021-04-06 DIAGNOSIS — Z95828 Presence of other vascular implants and grafts: Secondary | ICD-10-CM

## 2021-04-06 LAB — COMPREHENSIVE METABOLIC PANEL
ALT: 32 U/L (ref 0–44)
AST: 35 U/L (ref 15–41)
Albumin: 3.5 g/dL (ref 3.5–5.0)
Alkaline Phosphatase: 123 U/L (ref 38–126)
Anion gap: 7 (ref 5–15)
BUN: 7 mg/dL (ref 6–20)
CO2: 26 mmol/L (ref 22–32)
Calcium: 8.6 mg/dL — ABNORMAL LOW (ref 8.9–10.3)
Chloride: 106 mmol/L (ref 98–111)
Creatinine, Ser: 0.83 mg/dL (ref 0.61–1.24)
GFR, Estimated: >60 mL/min (ref >60)
Glucose, Bld: 138 mg/dL — ABNORMAL HIGH (ref 70–99)
Potassium: 3 mmol/L — ABNORMAL LOW (ref 3.5–5.1)
Sodium: 139 mmol/L (ref 135–145)
Total Bilirubin: 0.4 mg/dL (ref 0.3–1.2)
Total Protein: 6.3 g/dL — ABNORMAL LOW (ref 6.5–8.1)

## 2021-04-06 LAB — CBC WITH DIFFERENTIAL/PLATELET
Basophils Absolute: 0 10*3/uL (ref 0.0–0.1)
Basophils Relative: 0 %
Eosinophils Absolute: 0.2 10*3/uL (ref 0.0–0.5)
Eosinophils Relative: 2 %
HCT: 41.8 % (ref 39.0–52.0)
Hemoglobin: 14.1 g/dL (ref 13.0–17.0)
Lymphocytes Relative: 24 %
Lymphs Abs: 1.8 10*3/uL (ref 0.7–4.0)
MCH: 32 pg (ref 26.0–34.0)
MCHC: 33.7 g/dL (ref 30.0–36.0)
MCV: 94.8 fL (ref 80.0–100.0)
Metamyelocytes Relative: 2 %
Monocytes Absolute: 0.3 10*3/uL (ref 0.1–1.0)
Monocytes Relative: 4 %
Myelocytes: 1 %
Neutro Abs: 5 10*3/uL (ref 1.7–7.7)
Neutrophils Relative %: 66 %
Platelets: 171 10*3/uL (ref 150–400)
Promyelocytes Relative: 1 %
RBC: 4.41 MIL/uL (ref 4.22–5.81)
RDW: 19 % — ABNORMAL HIGH (ref 11.5–15.5)
WBC: 7.6 10*3/uL (ref 4.0–10.5)
nRBC: 0.4 % — ABNORMAL HIGH (ref 0.0–0.2)

## 2021-04-06 LAB — MAGNESIUM: Magnesium: 1.9 mg/dL (ref 1.7–2.4)

## 2021-04-06 MED ORDER — FAMOTIDINE 20 MG IN NS 100 ML IVPB
20.0000 mg | Freq: Once | INTRAVENOUS | Status: AC
Start: 2021-04-06 — End: 2021-04-06
  Administered 2021-04-06: 20 mg via INTRAVENOUS
  Filled 2021-04-06: qty 20

## 2021-04-06 MED ORDER — SODIUM CHLORIDE 0.9 % IV SOLN
5000.0000 mg | INTRAVENOUS | Status: DC
  Administered 2021-04-06: 5000 mg via INTRAVENOUS
  Filled 2021-04-06: qty 100

## 2021-04-06 MED ORDER — DIPHENHYDRAMINE HCL 50 MG/ML IJ SOLN
50.0000 mg | Freq: Once | INTRAMUSCULAR | Status: AC
  Administered 2021-04-06: 50 mg via INTRAVENOUS
  Filled 2021-04-06: qty 1

## 2021-04-06 MED ORDER — SODIUM CHLORIDE 0.9% FLUSH
10.0000 mL | INTRAVENOUS | Status: DC | PRN
  Administered 2021-04-06: 10 mL

## 2021-04-06 MED ORDER — DEXTROSE 5 % IV SOLN: Freq: Once | INTRAVENOUS | Status: AC

## 2021-04-06 MED ORDER — SODIUM CHLORIDE 0.9 % IV SOLN
10.0000 mg | Freq: Once | INTRAVENOUS | Status: AC
  Administered 2021-04-06: 10 mg via INTRAVENOUS
  Filled 2021-04-06: qty 10

## 2021-04-06 MED ORDER — FLUOROURACIL CHEMO INJECTION 2.5 GM/50ML
400.0000 mg/m2 | Freq: Once | INTRAVENOUS | Status: AC
  Administered 2021-04-06: 900 mg via INTRAVENOUS
  Filled 2021-04-06: qty 18

## 2021-04-06 MED ORDER — LEUCOVORIN CALCIUM INJECTION 350 MG
400.0000 mg/m2 | Freq: Once | INTRAVENOUS | Status: AC
  Administered 2021-04-06: 892 mg via INTRAVENOUS
  Filled 2021-04-06: qty 44.6

## 2021-04-06 MED ORDER — OXALIPLATIN CHEMO INJECTION 100 MG/20ML
85.0000 mg/m2 | Freq: Once | INTRAVENOUS | Status: AC
  Administered 2021-04-06: 190 mg via INTRAVENOUS
  Filled 2021-04-06: qty 38

## 2021-04-06 MED ORDER — PALONOSETRON HCL INJECTION 0.25 MG/5ML
0.2500 mg | Freq: Once | INTRAVENOUS | Status: AC
Start: 2021-04-06 — End: 2021-04-06
  Administered 2021-04-06: 0.25 mg via INTRAVENOUS
  Filled 2021-04-06: qty 5

## 2021-04-06 NOTE — Assessment & Plan Note (Addendum)
This is likely caused by diarrhea He will continue to use Imodium as needed He will continue potassium replacement therapy and I advised potassium rich food His recent magnesium level was adequate
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
## 2021-04-06 NOTE — Progress Notes (Signed)
Patient tolerated chemotherapy with no complaints voiced. Side effects with management reviewed understanding verbalized. 5FU pump connected with no alarms noted. Band aid applied. Patient left in satisfactory condition with VSS and no s/s of distress noted.

## 2021-04-06 NOTE — Assessment & Plan Note (Signed)
I have reviewed documentation by Dr. Delton Coombes His blood counts are satisfactory He will proceed with treatment as scheduled He will probably benefit from repeat imaging study soon but I will defer to his primary oncologist to decide

## 2021-04-06 NOTE — Patient Instructions (Signed)
[facility]  Discharge Instructions: Thank you for choosing [facility] to provide your oncology and hematology care.  If you have a lab appointment with the [facility], please come in thru the Main Entrance and check in at the main information desk.  Wear comfortable clothing and clothing appropriate for easy access to any Portacath or PICC line.   We strive to give you quality time with your provider. You may need to reschedule your appointment if you arrive late (15 or more minutes).  Arriving late affects you and other patients whose appointments are after yours.  Also, if you miss three or more appointments without notifying the office, you may be dismissed from the clinic at the provider's discretion.      For prescription refill requests, have your pharmacy contact our office and allow 72 hours for refills to be completed.    Today you received the following chemotherapy and/or immunotherapy agents Folox, 5FU pump started.     To help prevent nausea and vomiting after your treatment, we encourage you to take your nausea medication as directed.  BELOW ARE SYMPTOMS THAT SHOULD BE REPORTED IMMEDIATELY: . *FEVER GREATER THAN 100.4 F (38 C) OR HIGHER . *CHILLS OR SWEATING . *NAUSEA AND VOMITING THAT IS NOT CONTROLLED WITH YOUR NAUSEA MEDICATION . *UNUSUAL SHORTNESS OF BREATH . *UNUSUAL BRUISING OR BLEEDING . *URINARY PROBLEMS (pain or burning when urinating, or frequent urination) . *BOWEL PROBLEMS (unusual diarrhea, constipation, pain near the anus) . TENDERNESS IN MOUTH AND THROAT WITH OR WITHOUT PRESENCE OF ULCERS (sore throat, sores in mouth, or a toothache) . UNUSUAL RASH, SWELLING OR PAIN  . UNUSUAL VAGINAL DISCHARGE OR ITCHING   Items with * indicate a potential emergency and should be followed up as soon as possible or go to the Emergency Department if any problems should occur.  Please show the CHEMOTHERAPY ALERT CARD or IMMUNOTHERAPY ALERT CARD  at check-in to the Emergency Department and triage nurse.  Should you have questions after your visit or need to cancel or reschedule your appointment, please contact [facility] [phone]  and follow the prompts.  Office hours are 8:00 a.m. to 4:30 p.m. Monday - Friday. Please note that voicemails left after 4:00 p.m. may not be returned until the following business day.  We are closed weekends and major holidays. You have access to a nurse at all times for urgent questions. Please call the main number to the clinic [phone] and follow the prompts.  For any non-urgent questions, you may also contact your provider using MyChart. We now offer e-Visits for anyone 46 and older to request care online for non-urgent symptoms. For details visit [email or web address].   Also download the MyChart app! Go to the app store, search "MyChart", open the app, select [facility], and log in with your MyChart username and password.  Due to Covid, a mask is required upon entering the hospital/clinic. If you do not have a mask, one will be given to you upon arrival. For doctor visits, patients may have 1 support person aged 20 or older with them. For treatment visits, patients cannot have anyone with them due to current Covid guidelines and our immunocompromised population.

## 2021-04-06 NOTE — Assessment & Plan Note (Signed)
This is likely due to side effects of chemotherapy I recommend he stop oral iron supplement that could sometimes contribute to diarrhea He is not anemic

## 2021-04-07 ENCOUNTER — Encounter: Payer: BLUE CROSS/BLUE SHIELD | Admitting: Family Medicine

## 2021-04-08 ENCOUNTER — Inpatient Hospital Stay (HOSPITAL_COMMUNITY): Payer: BLUE CROSS/BLUE SHIELD

## 2021-04-08 ENCOUNTER — Other Ambulatory Visit: Payer: Self-pay

## 2021-04-08 VITALS — BP 122/76 | HR 85 | Temp 97.2°F | Resp 18

## 2021-04-08 DIAGNOSIS — Z5189 Encounter for other specified aftercare: Secondary | ICD-10-CM | POA: Diagnosis not present

## 2021-04-08 DIAGNOSIS — R5383 Other fatigue: Secondary | ICD-10-CM | POA: Diagnosis not present

## 2021-04-08 DIAGNOSIS — R202 Paresthesia of skin: Secondary | ICD-10-CM | POA: Diagnosis not present

## 2021-04-08 DIAGNOSIS — C182 Malignant neoplasm of ascending colon: Secondary | ICD-10-CM

## 2021-04-08 DIAGNOSIS — Z95828 Presence of other vascular implants and grafts: Secondary | ICD-10-CM

## 2021-04-08 DIAGNOSIS — Z8261 Family history of arthritis: Secondary | ICD-10-CM | POA: Diagnosis not present

## 2021-04-08 DIAGNOSIS — Z8269 Family history of other diseases of the musculoskeletal system and connective tissue: Secondary | ICD-10-CM | POA: Diagnosis not present

## 2021-04-08 DIAGNOSIS — R197 Diarrhea, unspecified: Secondary | ICD-10-CM | POA: Diagnosis not present

## 2021-04-08 DIAGNOSIS — Z8249 Family history of ischemic heart disease and other diseases of the circulatory system: Secondary | ICD-10-CM | POA: Diagnosis not present

## 2021-04-08 DIAGNOSIS — E785 Hyperlipidemia, unspecified: Secondary | ICD-10-CM | POA: Diagnosis not present

## 2021-04-08 DIAGNOSIS — Z79899 Other long term (current) drug therapy: Secondary | ICD-10-CM | POA: Diagnosis not present

## 2021-04-08 DIAGNOSIS — Z832 Family history of diseases of the blood and blood-forming organs and certain disorders involving the immune mechanism: Secondary | ICD-10-CM | POA: Diagnosis not present

## 2021-04-08 DIAGNOSIS — Z5111 Encounter for antineoplastic chemotherapy: Secondary | ICD-10-CM | POA: Diagnosis not present

## 2021-04-08 DIAGNOSIS — E876 Hypokalemia: Secondary | ICD-10-CM | POA: Diagnosis not present

## 2021-04-08 MED ORDER — HEPARIN SOD (PORK) LOCK FLUSH 100 UNIT/ML IV SOLN
500.0000 [IU] | Freq: Once | INTRAVENOUS | Status: AC | PRN
  Administered 2021-04-08: 500 [IU]

## 2021-04-08 MED ORDER — PEGFILGRASTIM-CBQV 6 MG/0.6ML ~~LOC~~ SOSY
6.0000 mg | PREFILLED_SYRINGE | Freq: Once | SUBCUTANEOUS | Status: AC
  Administered 2021-04-08: 6 mg via SUBCUTANEOUS
  Filled 2021-04-08: qty 0.6

## 2021-04-08 MED ORDER — SODIUM CHLORIDE 0.9% FLUSH
10.0000 mL | INTRAVENOUS | Status: DC | PRN
  Administered 2021-04-08: 10 mL

## 2021-04-08 NOTE — Patient Instructions (Signed)
[facility]  Discharge Instructions: Thank you for choosing [facility] to provide your oncology and hematology care.  If you have a lab appointment with the [facility], please come in thru the Main Entrance and check in at the main information desk.  Wear comfortable clothing and clothing appropriate for easy access to any Portacath or PICC line.   We strive to give you quality time with your provider. You may need to reschedule your appointment if you arrive late (15 or more minutes).  Arriving late affects you and other patients whose appointments are after yours.  Also, if you miss three or more appointments without notifying the office, you may be dismissed from the clinic at the provider's discretion.      For prescription refill requests, have your pharmacy contact our office and allow 72 hours for refills to be completed.       To help prevent nausea and vomiting after your treatment, we encourage you to take your nausea medication as directed.  BELO UNUSUAL VAGINAL DISCHARGE OR ITCHING   Items with * indicate a potential emergency and should be followed up as soon as possible or go to the Emergency Department if any problems should occur.  Please show the CHEMOTHERAPY ALERT CARD or IMMUNOTHERAPY ALERT CARD at check-in to the Emergency Department and triage nurse.  Should you have questions after your visit or need to cancel or reschedule your appointment, please contact [facility] [phone]  and follow the prompts.  Office hours are 8:00 a.m. to 4:30 p.m. Monday - Friday. Please note that voicemails left after 4:00 p.m. may not be returned until the following business day.  We are closed weekends and major holidays. You have access to a nurse at all times for urgent questions. Please call the main number to the clinic [phone] and follow the prompts.  For any non-urgent questions, you may also contact your provider using MyChart. We  now offer e-Visits for anyone 74 and older to request care online for non-urgent symptoms. For details visit [email or web address].   Also download the MyChart app! Go to the app store, search "MyChart", open the app, select [facility], and log in with your MyChart username and password.  Due to Covid, a mask is required upon entering the hospital/clinic. If you do not have a mask, one will be given to you upon arrival. For doctor visits, patients may have 1 support person aged 62 or older with them. For treatment visits, patients cannot have anyone with them due to current Covid guidelines and our immunocompromised population.

## 2021-04-20 ENCOUNTER — Encounter (HOSPITAL_COMMUNITY): Payer: Self-pay | Admitting: Hematology and Oncology

## 2021-04-20 ENCOUNTER — Inpatient Hospital Stay (HOSPITAL_COMMUNITY): Payer: BLUE CROSS/BLUE SHIELD

## 2021-04-20 ENCOUNTER — Other Ambulatory Visit: Payer: Self-pay

## 2021-04-20 ENCOUNTER — Inpatient Hospital Stay (HOSPITAL_BASED_OUTPATIENT_CLINIC_OR_DEPARTMENT_OTHER): Payer: BLUE CROSS/BLUE SHIELD | Admitting: Hematology and Oncology

## 2021-04-20 VITALS — BP 109/70 | HR 72 | Temp 96.8°F | Resp 18

## 2021-04-20 DIAGNOSIS — Z8269 Family history of other diseases of the musculoskeletal system and connective tissue: Secondary | ICD-10-CM | POA: Diagnosis not present

## 2021-04-20 DIAGNOSIS — C182 Malignant neoplasm of ascending colon: Secondary | ICD-10-CM

## 2021-04-20 DIAGNOSIS — K521 Toxic gastroenteritis and colitis: Secondary | ICD-10-CM | POA: Diagnosis not present

## 2021-04-20 DIAGNOSIS — Z95828 Presence of other vascular implants and grafts: Secondary | ICD-10-CM

## 2021-04-20 DIAGNOSIS — Z5111 Encounter for antineoplastic chemotherapy: Secondary | ICD-10-CM | POA: Diagnosis not present

## 2021-04-20 DIAGNOSIS — Z8249 Family history of ischemic heart disease and other diseases of the circulatory system: Secondary | ICD-10-CM | POA: Diagnosis not present

## 2021-04-20 DIAGNOSIS — R5383 Other fatigue: Secondary | ICD-10-CM | POA: Diagnosis not present

## 2021-04-20 DIAGNOSIS — Z832 Family history of diseases of the blood and blood-forming organs and certain disorders involving the immune mechanism: Secondary | ICD-10-CM | POA: Diagnosis not present

## 2021-04-20 DIAGNOSIS — Z8261 Family history of arthritis: Secondary | ICD-10-CM | POA: Diagnosis not present

## 2021-04-20 DIAGNOSIS — Z79899 Other long term (current) drug therapy: Secondary | ICD-10-CM | POA: Diagnosis not present

## 2021-04-20 DIAGNOSIS — E876 Hypokalemia: Secondary | ICD-10-CM | POA: Diagnosis not present

## 2021-04-20 DIAGNOSIS — R197 Diarrhea, unspecified: Secondary | ICD-10-CM | POA: Diagnosis not present

## 2021-04-20 DIAGNOSIS — E785 Hyperlipidemia, unspecified: Secondary | ICD-10-CM | POA: Diagnosis not present

## 2021-04-20 DIAGNOSIS — R202 Paresthesia of skin: Secondary | ICD-10-CM | POA: Diagnosis not present

## 2021-04-20 DIAGNOSIS — Z5189 Encounter for other specified aftercare: Secondary | ICD-10-CM | POA: Diagnosis not present

## 2021-04-20 LAB — CBC WITH DIFFERENTIAL/PLATELET
Abs Immature Granulocytes: 0.26 10*3/uL — ABNORMAL HIGH (ref 0.00–0.07)
Basophils Absolute: 0.1 10*3/uL (ref 0.0–0.1)
Basophils Relative: 2 %
Eosinophils Absolute: 0.1 10*3/uL (ref 0.0–0.5)
Eosinophils Relative: 2 %
HCT: 42.9 % (ref 39.0–52.0)
Hemoglobin: 14.3 g/dL (ref 13.0–17.0)
Immature Granulocytes: 6 %
Lymphocytes Relative: 28 %
Lymphs Abs: 1.3 10*3/uL (ref 0.7–4.0)
MCH: 31.9 pg (ref 26.0–34.0)
MCHC: 33.3 g/dL (ref 30.0–36.0)
MCV: 95.8 fL (ref 80.0–100.0)
Monocytes Absolute: 0.7 10*3/uL (ref 0.1–1.0)
Monocytes Relative: 17 %
Neutro Abs: 2 10*3/uL (ref 1.7–7.7)
Neutrophils Relative %: 45 %
Platelets: 160 10*3/uL (ref 150–400)
RBC: 4.48 MIL/uL (ref 4.22–5.81)
RDW: 19.1 % — ABNORMAL HIGH (ref 11.5–15.5)
WBC: 4.4 10*3/uL (ref 4.0–10.5)
nRBC: 0.5 % — ABNORMAL HIGH (ref 0.0–0.2)

## 2021-04-20 LAB — COMPREHENSIVE METABOLIC PANEL
ALT: 39 U/L (ref 0–44)
AST: 44 U/L — ABNORMAL HIGH (ref 15–41)
Albumin: 3.7 g/dL (ref 3.5–5.0)
Alkaline Phosphatase: 145 U/L — ABNORMAL HIGH (ref 38–126)
Anion gap: 9 (ref 5–15)
BUN: 8 mg/dL (ref 6–20)
CO2: 24 mmol/L (ref 22–32)
Calcium: 9.1 mg/dL (ref 8.9–10.3)
Chloride: 105 mmol/L (ref 98–111)
Creatinine, Ser: 0.91 mg/dL (ref 0.61–1.24)
GFR, Estimated: >60 mL/min (ref >60)
Glucose, Bld: 135 mg/dL — ABNORMAL HIGH (ref 70–99)
Potassium: 3.1 mmol/L — ABNORMAL LOW (ref 3.5–5.1)
Sodium: 138 mmol/L (ref 135–145)
Total Bilirubin: 0.6 mg/dL (ref 0.3–1.2)
Total Protein: 6.6 g/dL (ref 6.5–8.1)

## 2021-04-20 LAB — MAGNESIUM: Magnesium: 1.9 mg/dL (ref 1.7–2.4)

## 2021-04-20 MED ORDER — DEXTROSE 5 % IV SOLN
Freq: Once | INTRAVENOUS | Status: AC
Start: 2021-04-20 — End: 2021-04-20

## 2021-04-20 MED ORDER — HEPARIN SOD (PORK) LOCK FLUSH 100 UNIT/ML IV SOLN: 500.0000 [IU] | Freq: Once | INTRAVENOUS | Status: DC | PRN

## 2021-04-20 MED ORDER — OXALIPLATIN CHEMO INJECTION 100 MG/20ML
85.0000 mg/m2 | Freq: Once | INTRAVENOUS | Status: AC
  Administered 2021-04-20: 190 mg via INTRAVENOUS
  Filled 2021-04-20: qty 38

## 2021-04-20 MED ORDER — FAMOTIDINE 20 MG IN NS 100 ML IVPB
20.0000 mg | Freq: Once | INTRAVENOUS | Status: AC
  Administered 2021-04-20: 20 mg via INTRAVENOUS
  Filled 2021-04-20: qty 20

## 2021-04-20 MED ORDER — PALONOSETRON HCL INJECTION 0.25 MG/5ML
0.2500 mg | Freq: Once | INTRAVENOUS | Status: AC
  Administered 2021-04-20: 0.25 mg via INTRAVENOUS
  Filled 2021-04-20: qty 5

## 2021-04-20 MED ORDER — SODIUM CHLORIDE 0.9% FLUSH: 10.0000 mL | INTRAVENOUS | Status: DC | PRN

## 2021-04-20 MED ORDER — FLUOROURACIL CHEMO INJECTION 2.5 GM/50ML
400.0000 mg/m2 | Freq: Once | INTRAVENOUS | Status: AC
  Administered 2021-04-20: 900 mg via INTRAVENOUS
  Filled 2021-04-20: qty 18

## 2021-04-20 MED ORDER — SODIUM CHLORIDE 0.9 % IV SOLN
10.0000 mg | Freq: Once | INTRAVENOUS | Status: AC
  Administered 2021-04-20: 10 mg via INTRAVENOUS
  Filled 2021-04-20: qty 10

## 2021-04-20 MED ORDER — LEUCOVORIN CALCIUM INJECTION 350 MG
400.0000 mg/m2 | Freq: Once | INTRAVENOUS | Status: AC
  Administered 2021-04-20: 892 mg via INTRAVENOUS
  Filled 2021-04-20: qty 44.6

## 2021-04-20 MED ORDER — DIPHENHYDRAMINE HCL 50 MG/ML IJ SOLN
50.0000 mg | Freq: Once | INTRAMUSCULAR | Status: AC
  Administered 2021-04-20: 50 mg via INTRAVENOUS
  Filled 2021-04-20: qty 1

## 2021-04-20 MED ORDER — SODIUM CHLORIDE 0.9 % IV SOLN
5000.0000 mg | INTRAVENOUS | Status: DC
  Administered 2021-04-20: 5000 mg via INTRAVENOUS
  Filled 2021-04-20: qty 100

## 2021-04-20 NOTE — Progress Notes (Signed)
Patients port flushed without difficulty.  Good blood return noted with no bruising or swelling noted at site.  Stable during access and blood draw.  Patient to remain accessed for treatment. 

## 2021-04-20 NOTE — Assessment & Plan Note (Signed)
This is likely due to side effects of chemotherapy I recommend the patient to take scheduled Imodium to prevent diarrhea

## 2021-04-20 NOTE — Patient Instructions (Signed)
Carlos Miranda  Discharge Instructions: Thank you for choosing [facility] to provide your oncology and hematology care.  If you have a lab appointment with the [facility], please come in thru the Main Entrance and check in at the main information desk.  Wear comfortable clothing and clothing appropriate for easy access to any Portacath or PICC line.   We strive to give you quality time with your provider. You may need to reschedule your appointment if you arrive late (15 or more minutes).  Arriving late affects you and other patients whose appointments are after yours.  Also, if you miss three or more appointments without notifying the office, you may be dismissed from the clinic at the provider's discretion.      For prescription refill requests, have your pharmacy contact our office and allow 72 hours for refills to be completed.    Today you received the following chemotherapy and/or immunotherapy agents FOLFOX      To help prevent nausea and vomiting after your treatment, we encourage you to take your nausea medication as directed.  BELOW ARE SYMPTOMS THAT SHOULD BE REPORTED IMMEDIATELY: *FEVER GREATER THAN 100.4 F (38 C) OR HIGHER *CHILLS OR SWEATING *NAUSEA AND VOMITING THAT IS NOT CONTROLLED WITH YOUR NAUSEA MEDICATION *UNUSUAL SHORTNESS OF BREATH *UNUSUAL BRUISING OR BLEEDING *URINARY PROBLEMS (pain or burning when urinating, or frequent urination) *BOWEL PROBLEMS (unusual diarrhea, constipation, pain near the anus) TENDERNESS IN MOUTH AND THROAT WITH OR WITHOUT PRESENCE OF ULCERS (sore throat, sores in mouth, or a toothache) UNUSUAL RASH, SWELLING OR PAIN  UNUSUAL VAGINAL DISCHARGE OR ITCHING   Items with * indicate a potential emergency and should be followed up as soon as possible or go to the Emergency Department if any problems should occur.  Please show the CHEMOTHERAPY ALERT CARD or IMMUNOTHERAPY ALERT CARD at check-in to the Emergency  Department and triage nurse.  Should you have questions after your visit or need to cancel or reschedule your appointment, please contact [facility] [phone]  and follow the prompts.  Office hours are 8:00 a.m. to 4:30 p.m. Monday - Friday. Please note that voicemails left after 4:00 p.m. may not be returned until the following business day.  We are closed weekends and major holidays. You have access to a nurse at all times for urgent questions. Please call the main number to the clinic [phone] and follow the prompts.  For any non-urgent questions, you may also contact your provider using MyChart. We now offer e-Visits for anyone 65 and older to request care online for non-urgent symptoms. For details visit [email or web address].   Also download the MyChart app! Go to the app store, search "MyChart", open the app, select [facility], and log in with your MyChart username and password.  Due to Covid, a mask is required upon entering the hospital/clinic. If you do not have a mask, one will be given to you upon arrival. For doctor visits, patients may have 1 support person aged 70 or older with them. For treatment visits, patients cannot have anyone with them due to current Covid guidelines and our immunocompromised population.   Oxaliplatin Injection What is this medication? OXALIPLATIN (ox AL i PLA tin) is a chemotherapy drug. It targets fast dividing cells, like cancer cells, and causes these cells to die. This medicine is usedto treat cancers of the colon and rectum, and many other cancers. This medicine may be used for other purposes; ask your health care provider  orpharmacist if you have questions. COMMON BRAND NAME(S): Eloxatin What should I tell my care team before I take this medication? They need to know if you have any of these conditions: heart disease history of irregular heartbeat liver disease low blood counts, like white cells, platelets, or red blood  cells lung or breathing disease, like asthma take medicines that treat or prevent blood clots tingling of the fingers or toes, or other nerve disorder an unusual or allergic reaction to oxaliplatin, other chemotherapy, other medicines, foods, dyes, or preservatives pregnant or trying to get pregnant breast-feeding How should I use this medication? This drug is given as an infusion into a vein. It is administered in a hospitalor clinic by a specially trained health care professional. Talk to your pediatrician regarding the use of this medicine in children.Special care may be needed. Overdosage: If you think you have taken too much of this medicine contact apoison control center or emergency room at once. NOTE: This medicine is only for you. Do not share this medicine with others. What if I miss a dose? It is important not to miss a dose. Call your doctor or health careprofessional if you are unable to keep an appointment. What may interact with this medication? Do not take this medicine with any of the following medications: cisapride dronedarone pimozide thioridazine This medicine may also interact with the following medications: aspirin and aspirin-like medicines certain medicines that treat or prevent blood clots like warfarin, apixaban, dabigatran, and rivaroxaban cisplatin cyclosporine diuretics medicines for infection like acyclovir, adefovir, amphotericin B, bacitracin, cidofovir, foscarnet, ganciclovir, gentamicin, pentamidine, vancomycin NSAIDs, medicines for pain and inflammation, like ibuprofen or naproxen other medicines that prolong the QT interval (an abnormal heart rhythm) pamidronate zoledronic acid This list may not describe all possible interactions. Give your health care provider a list of all the medicines, herbs, non-prescription drugs, or dietary supplements you use. Also tell them if you smoke, drink alcohol, or use illegaldrugs. Some items may interact with your  medicine. What should I watch for while using this medication? Your condition will be monitored carefully while you are receiving thismedicine. You may need blood work done while you are taking this medicine. This medicine may make you feel generally unwell. This is not uncommon as chemotherapy can affect healthy cells as well as cancer cells. Report any side effects. Continue your course of treatment even though you feel ill unless yourhealthcare professional tells you to stop. This medicine can make you more sensitive to cold. Do not drink cold drinks or use ice. Cover exposed skin before coming in contact with cold temperatures or cold objects. When out in cold weather wear warm clothing and cover your mouth and nose to warm the air that goes into your lungs. Tell your doctor if you getsensitive to the cold. Do not become pregnant while taking this medicine or for 9 months after stopping it. Women should inform their health care professional if they wish to become pregnant or think they might be pregnant. Men should not father a child while taking this medicine and for 6 months after stopping it. There is potential for serious side effects to an unborn child. Talk to your health careprofessional for more information. Do not breast-feed a child while taking this medicine or for 3 months afterstopping it. This medicine has caused ovarian failure in some women. This medicine may make it more difficult to get pregnant. Talk to your health care professional if [place] Sellers concerned about your fertility. This medicine has  caused decreased sperm counts in some men. This may make it more difficult to father a child. Talk to your health care professional if [place] S[place] concerned about your fertility. This medicine may increase your risk of getting an infection. Call your health care professional for advice if you get a fever, chills, or sore throat, or other symptoms of a cold or flu. Do not treat yourself. Try to avoid  beingaround people who are sick. Avoid taking medicines that contain aspirin, acetaminophen, ibuprofen, naproxen, or ketoprofen unless instructed by your health care professional.These medicines may hide a fever. Be careful brushing or flossing your teeth or using a toothpick because you may get an infection or bleed more easily. If you have any dental work done, Primary school teacher you are receiving this medicine. What side effects may I notice from receiving this medication? Side effects that you should report to your doctor or health care professionalas soon as possible: allergic reactions like skin rash, itching or hives, swelling of the face, lips, or tongue breathing problems cough low blood counts - this medicine may decrease the number of white blood cells, red blood cells, and platelets. You may be at increased risk for infections and bleeding nausea, vomiting pain, redness, or irritation at site where injected pain, tingling, numbness in the hands or feet signs and symptoms of bleeding such as bloody or black, tarry stools; red or dark brown urine; spitting up blood or brown material that looks like coffee grounds; red spots on the skin; unusual bruising or bleeding from the eyes, gums, or nose signs and symptoms of a dangerous change in heartbeat or heart rhythm like chest pain; dizziness; fast, irregular heartbeat; palpitations; feeling faint or lightheaded; falls signs and symptoms of infection like fever; chills; cough; sore throat; pain or trouble passing urine signs and symptoms of liver injury like dark yellow or brown urine; general ill feeling or flu-like symptoms; light-colored stools; loss of appetite; nausea; right upper belly pain; unusually weak or tired; yellowing of the eyes or skin signs and symptoms of low red blood cells or anemia such as unusually weak or tired; feeling faint or lightheaded; falls signs and symptoms of muscle injury like dark urine; trouble passing urine  or change in the amount of urine; unusually weak or tired; muscle pain; back pain Side effects that usually do not require medical attention (report to yourdoctor or health care professional if they continue or are bothersome): changes in taste diarrhea gas hair loss loss of appetite mouth sores This list may not describe all possible side effects. Call your doctor for medical advice about side effects. You may report side effects to FDA at1-800-FDA-1088. Where should I keep my medication? This drug is given in a hospital or clinic and will not be stored at home. NOTE: This sheet is a summary. It may not cover all possible information. If you have questions about this medicine, talk to your doctor, pharmacist, orhealth care provider.  2022 Elsevier/Gold Standard (2019-03-05 12:20:35)  Leucovorin injection What is this medication? LEUCOVORIN (loo koe VOR in) is used to prevent or treat the harmful effects of some medicines. This medicine is used to treat anemia caused by a low amount of folic acid in the body. It is also used with 5-fluorouracil (5-FU) to treatcolon cancer. This medicine may be used for other purposes; ask your health care provider orpharmacist if you have questions. What should I tell my care team before I take this medication? They need to know if you  have any of these conditions: anemia from low levels of vitamin B-12 in the blood an unusual or allergic reaction to leucovorin, folic acid, other medicines, foods, dyes, or preservatives pregnant or trying to get pregnant breast-feeding How should I use this medication? This medicine is for injection into a muscle or into a vein. It is given by ahealth care professional in a hospital or clinic setting. Talk to your pediatrician regarding the use of this medicine in children.Special care may be needed. Overdosage: If you think you have taken too much of this medicine contact apoison control center or emergency room at  once. NOTE: This medicine is only for you. Do not share this medicine with others. What if I miss a dose? This does not apply. What may interact with this medication? capecitabine fluorouracil phenobarbital phenytoin primidone trimethoprim-sulfamethoxazole This list may not describe all possible interactions. Give your health care provider a list of all the medicines, herbs, non-prescription drugs, or dietary supplements you use. Also tell them if you smoke, drink alcohol, or use illegaldrugs. Some items may interact with your medicine. What should I watch for while using this medication? Your condition will be monitored carefully while you are receiving thismedicine. This medicine may increase the side effects of 5-fluorouracil, 5-FU. Tell your doctor or health care professional if you have diarrhea or mouth sores that donot get better or that get worse. What side effects may I notice from receiving this medication? Side effects that you should report to your doctor or health care professionalas soon as possible: allergic reactions like skin rash, itching or hives, swelling of the face, lips, or tongue breathing problems fever, infection mouth sores unusual bleeding or bruising unusually weak or tired Side effects that usually do not require medical attention (report to yourdoctor or health care professional if they continue or are bothersome): constipation or diarrhea loss of appetite nausea, vomiting This list may not describe all possible side effects. Call your doctor for medical advice about side effects. You may report side effects to FDA at1-800-FDA-1088. Where should I keep my medication? This drug is given in a hospital or clinic and will not be stored at home. NOTE: This sheet is a summary. It may not cover all possible information. If you have questions about this medicine, talk to your doctor, pharmacist, orhealth care provider.  2022 Elsevier/Gold Standard (2008-04-21  16:50:29)   Fluorouracil, 5-FU injection What is this medication? FLUOROURACIL, 5-FU (flure oh YOOR a sil) is a chemotherapy drug. It slows the growth of cancer cells. This medicine is used to treat many types of cancer like breast cancer, colon or rectal cancer, pancreatic cancer, and stomachcancer. This medicine may be used for other purposes; ask your health care provider orpharmacist if you have questions. COMMON BRAND NAME(S): Adrucil What should I tell my care team before I take this medication? They need to know if you have any of these conditions: blood disorders dihydropyrimidine dehydrogenase (DPD) deficiency infection (especially a virus infection such as chickenpox, cold sores, or herpes) kidney disease liver disease malnourished, poor nutrition recent or ongoing radiation therapy an unusual or allergic reaction to fluorouracil, other chemotherapy, other medicines, foods, dyes, or preservatives pregnant or trying to get pregnant breast-feeding How should I use this medication? This drug is given as an infusion or injection into a vein. It is administeredin a hospital or clinic by a specially trained health care professional. Talk to your pediatrician regarding the use of this medicine in children.Special care may be needed.  Overdosage: If you think you have taken too much of this medicine contact apoison control center or emergency room at once. NOTE: This medicine is only for you. Do not share this medicine with others. What if I miss a dose? It is important not to miss your dose. Call your doctor or health careprofessional if you are unable to keep an appointment. What may interact with this medication? Do not take this medicine with any of the following medications: live virus vaccines This medicine may also interact with the following medications: medicines that treat or prevent blood clots like warfarin, enoxaparin, and dalteparin This list may not describe all  possible interactions. Give your health care provider a list of all the medicines, herbs, non-prescription drugs, or dietary supplements you use. Also tell them if you smoke, drink alcohol, or use illegaldrugs. Some items may interact with your medicine. What should I watch for while using this medication? Visit your doctor for checks on your progress. This drug may make you feel generally unwell. This is not uncommon, as chemotherapy can affect healthy cells as well as cancer cells. Report any side effects. Continue your course oftreatment even though you feel ill unless your doctor tells you to stop. In some cases, you may be given additional medicines to help with side effects.Follow all directions for their use. Call your doctor or health care professional for advice if you get a fever, chills or sore throat, or other symptoms of a cold or flu. Do not treat yourself. This drug decreases your body's ability to fight infections. Try toavoid being around people who are sick. This medicine may increase your risk to bruise or bleed. Call your doctor orhealth care professional if you notice any unusual bleeding. Be careful brushing and flossing your teeth or using a toothpick because you may get an infection or bleed more easily. If you have any dental work done,tell your dentist you are receiving this medicine. Avoid taking products that contain aspirin, acetaminophen, ibuprofen, naproxen, or ketoprofen unless instructed by your doctor. These medicines may hide afever. Do not become pregnant while taking this medicine. Women should inform their doctor if they wish to become pregnant or think they might be pregnant. There is a potential for serious side effects to an unborn child. Talk to your health care professional or pharmacist for more information. Do not breast-feed aninfant while taking this medicine. Men should inform their doctor if they wish to father a child. This medicinemay lower sperm  counts. Do not treat diarrhea with over the counter products. Contact your doctor ifyou have diarrhea that lasts more than 2 days or if it is severe and watery. This medicine can make you more sensitive to the sun. Keep out of the sun. If you cannot avoid being in the sun, wear protective clothing and use sunscreen.Do not use sun lamps or tanning beds/booths. What side effects may I notice from receiving this medication? Side effects that you should report to your doctor or health care professionalas soon as possible: allergic reactions like skin rash, itching or hives, swelling of the face, lips, or tongue low blood counts - this medicine may decrease the number of white blood cells, red blood cells and platelets. You may be at increased risk for infections and bleeding. signs of infection - fever or chills, cough, sore throat, pain or difficulty passing urine signs of decreased platelets or bleeding - bruising, pinpoint red spots on the skin, black, tarry stools, blood in the urine signs of  decreased red blood cells - unusually weak or tired, fainting spells, lightheadedness breathing problems changes in vision chest pain mouth sores nausea and vomiting pain, swelling, redness at site where injected pain, tingling, numbness in the hands or feet redness, swelling, or sores on hands or feet stomach pain unusual bleeding Side effects that usually do not require medical attention (report to yourdoctor or health care professional if they continue or are bothersome): changes in finger or toe nails diarrhea dry or itchy skin hair loss headache loss of appetite sensitivity of eyes to the light stomach upset unusually teary eyes This list may not describe all possible side effects. Call your doctor for medical advice about side effects. You may report side effects to FDA at1-800-FDA-1088. Where should I keep my medication? This drug is given in a hospital or clinic and will not be stored at  home. NOTE: This sheet is a summary. It may not cover all possible information. If you have questions about this medicine, talk to your doctor, pharmacist, orhealth care provider.  2022 Elsevier/Gold Standard (2019-09-16 15:00:03)

## 2021-04-20 NOTE — Assessment & Plan Note (Signed)
Overall, he tolerated treatment fairly well with mild diarrhea and persistent neuropathy His blood counts are satisfactory He will proceed with treatment as scheduled He will probably benefit from repeat imaging study soon but I will defer to his primary oncologist to decide

## 2021-04-20 NOTE — Assessment & Plan Note (Signed)
This is likely caused by diarrhea He will continue to use Imodium as needed He will continue potassium replacement therapy and I advised potassium rich food His recent magnesium level was adequate

## 2021-04-20 NOTE — Progress Notes (Signed)
[facility] OFFICE PROGRESS NOTE  Patient Care Team: Dettinger, Fransisca Kaufmann, MD as PCP - General (Family Medicine) Cameron Sprang, MD as Consulting Physician (Neurology) Brien Mates, RN as Oncology Nurse Navigator (Oncology)  ASSESSMENT & PLAN:  Colon cancer, ascending (Carlos Miranda) Overall, he tolerated treatment fairly well with mild diarrhea and persistent neuropathy His blood counts are satisfactory He will proceed with treatment as scheduled He will probably benefit from repeat imaging study soon but I will defer to his primary oncologist to decide  Hypokalemia due to excessive gastrointestinal loss of potassium This is likely caused by diarrhea He will continue to use Imodium as needed He will continue potassium replacement therapy and I advised potassium rich food His recent magnesium level was adequate  Diarrhea due to drug This is likely due to side effects of chemotherapy I recommend the patient to take scheduled Imodium to prevent diarrhea  No orders of the defined types were placed in this encounter.   All questions were answered. The patient knows to call the clinic with any problems, questions or concerns. The total time spent in the appointment was 20 minutes encounter with patients including review of chart and various tests results, discussions about plan of care and coordination of care plan   Heath Lark, MD 04/20/2021 12:58 PM  INTERVAL HISTORY: Please see below for problem oriented charting. He returns for chemotherapy and follow-up Since last time I saw him, he continues to have mild diarrhea He struggled a little bit swallowing his pills No recent mucositis, nausea or vomiting No recent infection  SUMMARY OF ONCOLOGIC HISTORY: Oncology History  Colon cancer, ascending (Carlos Miranda)  11/10/2020 Initial Diagnosis   Colon cancer, ascending (Carlos Miranda)    01/18/2021 Cancer Staging   Staging form: Colon and Rectum, AJCC 8th Edition - Pathologic stage from  01/18/2021: Stage IIIC (pT4a, pN2b, cM0) - Signed by Derek Jack, MD on 01/18/2021  Stage prefix: Initial diagnosis    01/25/2021 -  Chemotherapy    Patient is on Treatment Plan: COLORECTAL FOLFOX Q14D X 6 MONTHS         REVIEW OF SYSTEMS:   Constitutional: Denies fevers, chills or abnormal weight loss Eyes: Denies blurriness of vision Ears, nose, mouth, throat, and face: Denies mucositis or sore throat Respiratory: Denies cough, dyspnea or wheezes Cardiovascular: Denies palpitation, chest discomfort or lower extremity swelling Skin: Denies abnormal skin rashes Lymphatics: Denies new lymphadenopathy or easy bruising Neurological:Denies numbness, tingling or new weaknesses Behavioral/Psych: Mood is stable, no new changes  All other systems were reviewed with the patient and are negative.  I have reviewed the past medical history, past surgical history, social history and family history with the patient and they are unchanged from previous note.  ALLERGIES:  is allergic to penicillins, furosemide, streptomycin, and sulfa antibiotics.  MEDICATIONS:  Current Outpatient Medications  Medication Sig Dispense Refill   ferrous sulfate 325 (65 FE) MG tablet Take 325 mg by mouth daily.     fluorouracil CALGB 19379 2,400 mg/m2 in sodium chloride 0.9 % 150 mL Inject 1,920 mg/m2 into the vein over 48 hr. Every 14 days x 12 cycles     FLUOROURACIL IV Inject 320 mg/m2 into the vein every 14 (fourteen) days.     LamoTRIgine 300 MG TB24 24 hour tablet Take 1 tablet every night 90 tablet 3   LEUCOVORIN CALCIUM IV Inject into the vein every 14 (fourteen) days.     levETIRAcetam (KEPPRA XR) 500 MG 24 hr tablet Take 2  tablets (1,000 mg total) by mouth at bedtime. 180 tablet 3   lidocaine-prilocaine (EMLA) cream Apply a small amount to port a cath site and cover with plastic wrap 1 hour prior to infusion appointments 30 g 3   lisinopril (ZESTRIL) 10 MG tablet Take 1 tablet (10 mg total) by  mouth daily. 90 tablet 0   oxaliplatin in dextrose 5 % 500 mL Inject into the vein every 14 (fourteen) days.     potassium chloride SA (KLOR-CON) 20 MEQ tablet Take 1 tablet (20 mEq total) by mouth daily. 90 tablet 0   pravastatin (PRAVACHOL) 40 MG tablet TAKE 1 TABLET BY MOUTH EVERY DAY 90 tablet 0   prochlorperazine (COMPAZINE) 10 MG tablet Take 1 tablet (10 mg total) by mouth every 6 (six) hours as needed (Nausea or vomiting). 30 tablet 1   pseudoephedrine (SUDAFED) 120 MG 12 hr tablet Take 120 mg by mouth every 12 (twelve) hours as needed for congestion.     No current facility-administered medications for this visit.   Facility-Administered Medications Ordered in Other Visits  Medication Dose Route Frequency Provider Last Rate Last Admin   fluorouracil (ADRUCIL) 5,000 mg in sodium chloride 0.9 % 150 mL chemo infusion  5,000 mg Intravenous 1 day or 1 dose Alvy Bimler, Nikesh Teschner, MD       fluorouracil (ADRUCIL) chemo injection 900 mg  400 mg/m2 (Treatment Plan Recorded) Intravenous Once Alvy Bimler, Cameryn Chrisley, MD       heparin lock flush 100 unit/mL  500 Units Intracatheter Once PRN Alvy Bimler, Haidyn Chadderdon, MD       leucovorin 892 mg in dextrose 5 % 250 mL infusion  400 mg/m2 (Treatment Plan Recorded) Intravenous Once Heath Lark, MD 74 mL/hr at 04/20/21 1038 892 mg at 04/20/21 1038   oxaliplatin (ELOXATIN) 190 mg in dextrose 5 % 500 mL chemo infusion  85 mg/m2 (Treatment Plan Recorded) Intravenous Once Heath Lark, MD 135 mL/hr at 04/20/21 1036 190 mg at 04/20/21 1036   sodium chloride flush (NS) 0.9 % injection 10 mL  10 mL Intracatheter PRN Heath Lark, MD        PHYSICAL EXAMINATION: ECOG PERFORMANCE STATUS: 1 - Symptomatic but completely ambulatory  Vitals:   04/20/21 0755  BP: 117/78  Pulse: 81  Resp: 18  Temp: (!) 96.8 F (36 C)  SpO2: 99%   Filed Weights   04/20/21 0755  Weight: 219 lb 6.4 oz (99.5 kg)    GENERAL:alert, no distress and comfortable SKIN: skin color, texture, turgor are normal, no rashes  or significant lesions EYES: normal, Conjunctiva are pink and non-injected, sclera clear OROPHARYNX:no exudate, no erythema and lips, buccal mucosa, and tongue normal  NECK: supple, thyroid normal size, non-tender, without nodularity LYMPH:  no palpable lymphadenopathy in the cervical, axillary or inguinal LUNGS: clear to auscultation and percussion with normal breathing effort HEART: regular rate & rhythm and no murmurs and no lower extremity edema ABDOMEN:abdomen soft, non-tender and normal bowel sounds Musculoskeletal:no cyanosis of digits and no clubbing  NEURO: alert & oriented x 3 with fluent speech, no focal motor/sensory deficits  LABORATORY DATA:  I have reviewed the data as listed    Component Value Date/Time   NA 138 04/20/2021 0753   NA 139 03/31/2020 1534   K 3.1 (L) 04/20/2021 0753   CL 105 04/20/2021 0753   CO2 24 04/20/2021 0753   GLUCOSE 135 (H) 04/20/2021 0753   BUN 8 04/20/2021 0753   BUN 11 03/31/2020 1534   CREATININE 0.91 04/20/2021 0753  CALCIUM 9.1 04/20/2021 0753   PROT 6.6 04/20/2021 0753   PROT 6.7 03/31/2020 1534   ALBUMIN 3.7 04/20/2021 0753   ALBUMIN 4.3 03/31/2020 1534   AST 44 (H) 04/20/2021 0753   ALT 39 04/20/2021 0753   ALKPHOS 145 (H) 04/20/2021 0753   BILITOT 0.6 04/20/2021 0753   BILITOT 0.6 03/31/2020 1534   GFRNONAA >60 04/20/2021 0753   GFRAA 108 03/31/2020 1534    No results found for: SPEP, UPEP  Lab Results  Component Value Date   WBC 4.4 04/20/2021   NEUTROABS 2.0 04/20/2021   HGB 14.3 04/20/2021   HCT 42.9 04/20/2021   MCV 95.8 04/20/2021   PLT 160 04/20/2021      Chemistry      Component Value Date/Time   NA 138 04/20/2021 0753   NA 139 03/31/2020 1534   K 3.1 (L) 04/20/2021 0753   CL 105 04/20/2021 0753   CO2 24 04/20/2021 0753   BUN 8 04/20/2021 0753   BUN 11 03/31/2020 1534   CREATININE 0.91 04/20/2021 0753      Component Value Date/Time   CALCIUM 9.1 04/20/2021 0753   ALKPHOS 145 (H) 04/20/2021 0753    AST 44 (H) 04/20/2021 0753   ALT 39 04/20/2021 0753   BILITOT 0.6 04/20/2021 0753   BILITOT 0.6 03/31/2020 1534

## 2021-04-20 NOTE — Progress Notes (Signed)
Pt here for FOLFOX.  Pt is complaining of headache today.  Has taken ibuprofen this morning with some relief.  Potassium 3.1.  ALK PHOS 145.  Okay for treatment per Dr Alvy Bimler.   Tolerated treatment well today without incidence.  Stable during and after treatment. Vital signs stable prior to discharge.  Discharged in stable condition ambulatory with ambulatory 42f pump. AVS reviewed.

## 2021-04-22 ENCOUNTER — Inpatient Hospital Stay (HOSPITAL_COMMUNITY): Payer: BLUE CROSS/BLUE SHIELD

## 2021-04-22 ENCOUNTER — Other Ambulatory Visit (HOSPITAL_COMMUNITY): Payer: Self-pay

## 2021-04-22 ENCOUNTER — Other Ambulatory Visit: Payer: Self-pay

## 2021-04-22 ENCOUNTER — Encounter (HOSPITAL_COMMUNITY): Payer: Self-pay

## 2021-04-22 ENCOUNTER — Other Ambulatory Visit: Payer: Self-pay | Admitting: Family Medicine

## 2021-04-22 VITALS — BP 119/71 | HR 94 | Temp 98.4°F | Resp 18

## 2021-04-22 DIAGNOSIS — Z8261 Family history of arthritis: Secondary | ICD-10-CM | POA: Diagnosis not present

## 2021-04-22 DIAGNOSIS — C182 Malignant neoplasm of ascending colon: Secondary | ICD-10-CM | POA: Diagnosis not present

## 2021-04-22 DIAGNOSIS — Z8249 Family history of ischemic heart disease and other diseases of the circulatory system: Secondary | ICD-10-CM | POA: Diagnosis not present

## 2021-04-22 DIAGNOSIS — R202 Paresthesia of skin: Secondary | ICD-10-CM | POA: Diagnosis not present

## 2021-04-22 DIAGNOSIS — Z8269 Family history of other diseases of the musculoskeletal system and connective tissue: Secondary | ICD-10-CM | POA: Diagnosis not present

## 2021-04-22 DIAGNOSIS — E876 Hypokalemia: Secondary | ICD-10-CM | POA: Diagnosis not present

## 2021-04-22 DIAGNOSIS — E785 Hyperlipidemia, unspecified: Secondary | ICD-10-CM | POA: Diagnosis not present

## 2021-04-22 DIAGNOSIS — Z79899 Other long term (current) drug therapy: Secondary | ICD-10-CM | POA: Diagnosis not present

## 2021-04-22 DIAGNOSIS — R197 Diarrhea, unspecified: Secondary | ICD-10-CM | POA: Diagnosis not present

## 2021-04-22 DIAGNOSIS — Z5189 Encounter for other specified aftercare: Secondary | ICD-10-CM | POA: Diagnosis not present

## 2021-04-22 DIAGNOSIS — Z832 Family history of diseases of the blood and blood-forming organs and certain disorders involving the immune mechanism: Secondary | ICD-10-CM | POA: Diagnosis not present

## 2021-04-22 DIAGNOSIS — Z95828 Presence of other vascular implants and grafts: Secondary | ICD-10-CM

## 2021-04-22 DIAGNOSIS — Z5111 Encounter for antineoplastic chemotherapy: Secondary | ICD-10-CM | POA: Diagnosis not present

## 2021-04-22 DIAGNOSIS — I10 Essential (primary) hypertension: Secondary | ICD-10-CM

## 2021-04-22 DIAGNOSIS — R5383 Other fatigue: Secondary | ICD-10-CM | POA: Diagnosis not present

## 2021-04-22 MED ORDER — HEPARIN SOD (PORK) LOCK FLUSH 100 UNIT/ML IV SOLN
500.0000 [IU] | Freq: Once | INTRAVENOUS | Status: AC | PRN
  Administered 2021-04-22: 500 [IU]

## 2021-04-22 MED ORDER — PEGFILGRASTIM-CBQV 6 MG/0.6ML ~~LOC~~ SOSY
PREFILLED_SYRINGE | SUBCUTANEOUS | Status: AC
  Filled 2021-04-22: qty 0.6

## 2021-04-22 MED ORDER — PEGFILGRASTIM-CBQV 6 MG/0.6ML ~~LOC~~ SOSY
6.0000 mg | PREFILLED_SYRINGE | Freq: Once | SUBCUTANEOUS | Status: AC
  Administered 2021-04-22: 6 mg via SUBCUTANEOUS

## 2021-04-22 MED ORDER — SODIUM CHLORIDE 0.9% FLUSH
10.0000 mL | INTRAVENOUS | Status: DC | PRN
  Administered 2021-04-22: 10 mL

## 2021-04-22 NOTE — Progress Notes (Signed)
Carlos Miranda presents to have home infusion pump d/c'd and for port-a-cath deaccess with flush.  Portacath located left chest wall accessed with  H 20 needle.  Good blood return present. Portacath flushed with NS and 500U/31ml Heparin, and needle removed intact.  Procedure tolerated well and without incident and remained stable throughout procedure.  Carlos Miranda presents today for injection per the provider's orders.  Udenyca administration without incident; injection site WNL; see MAR for injection details.  Pt remained stable throughout procedure.  Patient tolerated procedure well and without incident.  No questions or complaints noted at this time. Discharged ambulatory in stable condition.

## 2021-04-22 NOTE — Patient Instructions (Signed)
[facility]  Discharge Instructions: Thank you for choosing [facility] to provide your oncology and hematology care.  If you have a lab appointment with the [facility], please come in thru the Main Entrance and check in at the main information desk.  Wear comfortable clothing and clothing appropriate for easy access to any Portacath or PICC line.   We strive to give you quality time with your provider. You may need to reschedule your appointment if you arrive late (15 or more minutes).  Arriving late affects you and other patients whose appointments are after yours.  Also, if you miss three or more appointments without notifying the office, you may be dismissed from the clinic at the provider's discretion.      For prescription refill requests, have your pharmacy contact our office and allow 72 hours for refills to be completed.    Today you had the following:  ambulatory pump removal and Udenyca injection.      To help prevent nausea and vomiting after your treatment, we encourage you to take your nausea medication as directed.  BELOW ARE SYMPTOMS THAT SHOULD BE REPORTED IMMEDIATELY: *FEVER GREATER THAN 100.4 F (38 C) OR HIGHER *CHILLS OR SWEATING *NAUSEA AND VOMITING THAT IS NOT CONTROLLED WITH YOUR NAUSEA MEDICATION *UNUSUAL SHORTNESS OF BREATH *UNUSUAL BRUISING OR BLEEDING *URINARY PROBLEMS (pain or burning when urinating, or frequent urination) *BOWEL PROBLEMS (unusual diarrhea, constipation, pain near the anus) TENDERNESS IN MOUTH AND THROAT WITH OR WITHOUT PRESENCE OF ULCERS (sore throat, sores in mouth, or a toothache) UNUSUAL RASH, SWELLING OR PAIN  UNUSUAL VAGINAL DISCHARGE OR ITCHING   Items with * indicate a potential emergency and should be followed up as soon as possible or go to the Emergency Department if any problems should occur.  Please show the CHEMOTHERAPY ALERT CARD or IMMUNOTHERAPY ALERT CARD at check-in to the Emergency Department  and triage nurse.  Should you have questions after your visit or need to cancel or reschedule your appointment, please contact [facility] [phone]  and follow the prompts.  Office hours are 8:00 a.m. to 4:30 p.m. Monday - Friday. Please note that voicemails left after 4:00 p.m. may not be returned until the following business day.  We are closed weekends and major holidays. You have access to a nurse at all times for urgent questions. Please call the main number to the clinic [phone] and follow the prompts.  For any non-urgent questions, you may also contact your provider using MyChart. We now offer e-Visits for anyone 46 and older to request care online for non-urgent symptoms. For details visit [email or web address].   Also download the MyChart app! Go to the app store, search "MyChart", open the app, select [facility], and log in with your MyChart username and password.  Due to Covid, a mask is required upon entering the hospital/clinic. If you do not have a mask, one will be given to you upon arrival. For doctor visits, patients may have 1 support person aged 60 or older with them. For treatment visits, patients cannot have anyone with them due to current Covid guidelines and our immunocompromised population.

## 2021-04-27 ENCOUNTER — Encounter (HOSPITAL_COMMUNITY): Payer: Self-pay | Admitting: Hematology and Oncology

## 2021-04-27 DIAGNOSIS — C182 Malignant neoplasm of ascending colon: Secondary | ICD-10-CM | POA: Diagnosis not present

## 2021-04-28 ENCOUNTER — Encounter (HOSPITAL_COMMUNITY): Payer: Self-pay | Admitting: Hematology and Oncology

## 2021-04-29 ENCOUNTER — Ambulatory Visit (HOSPITAL_COMMUNITY): Payer: BLUE CROSS/BLUE SHIELD

## 2021-04-29 ENCOUNTER — Encounter (HOSPITAL_COMMUNITY): Payer: Self-pay

## 2021-05-04 ENCOUNTER — Inpatient Hospital Stay (HOSPITAL_BASED_OUTPATIENT_CLINIC_OR_DEPARTMENT_OTHER): Payer: BLUE CROSS/BLUE SHIELD | Admitting: Hematology and Oncology

## 2021-05-04 ENCOUNTER — Inpatient Hospital Stay (HOSPITAL_COMMUNITY): Payer: BLUE CROSS/BLUE SHIELD | Attending: Hematology

## 2021-05-04 ENCOUNTER — Other Ambulatory Visit (HOSPITAL_COMMUNITY): Payer: Self-pay | Admitting: Hematology and Oncology

## 2021-05-04 ENCOUNTER — Encounter (HOSPITAL_COMMUNITY): Payer: Self-pay | Admitting: Hematology and Oncology

## 2021-05-04 ENCOUNTER — Other Ambulatory Visit: Payer: Self-pay

## 2021-05-04 ENCOUNTER — Inpatient Hospital Stay (HOSPITAL_COMMUNITY): Payer: BLUE CROSS/BLUE SHIELD

## 2021-05-04 ENCOUNTER — Other Ambulatory Visit (HOSPITAL_COMMUNITY): Payer: Self-pay | Admitting: *Deleted

## 2021-05-04 VITALS — BP 115/65 | HR 71 | Temp 98.2°F | Resp 18

## 2021-05-04 DIAGNOSIS — Z95828 Presence of other vascular implants and grafts: Secondary | ICD-10-CM

## 2021-05-04 DIAGNOSIS — R599 Enlarged lymph nodes, unspecified: Secondary | ICD-10-CM | POA: Diagnosis not present

## 2021-05-04 DIAGNOSIS — Z5189 Encounter for other specified aftercare: Secondary | ICD-10-CM | POA: Diagnosis not present

## 2021-05-04 DIAGNOSIS — R200 Anesthesia of skin: Secondary | ICD-10-CM | POA: Diagnosis not present

## 2021-05-04 DIAGNOSIS — Z832 Family history of diseases of the blood and blood-forming organs and certain disorders involving the immune mechanism: Secondary | ICD-10-CM | POA: Insufficient documentation

## 2021-05-04 DIAGNOSIS — D61818 Other pancytopenia: Secondary | ICD-10-CM | POA: Diagnosis not present

## 2021-05-04 DIAGNOSIS — C182 Malignant neoplasm of ascending colon: Secondary | ICD-10-CM | POA: Diagnosis not present

## 2021-05-04 DIAGNOSIS — Z8249 Family history of ischemic heart disease and other diseases of the circulatory system: Secondary | ICD-10-CM | POA: Insufficient documentation

## 2021-05-04 DIAGNOSIS — N400 Enlarged prostate without lower urinary tract symptoms: Secondary | ICD-10-CM | POA: Diagnosis not present

## 2021-05-04 DIAGNOSIS — R5383 Other fatigue: Secondary | ICD-10-CM | POA: Diagnosis not present

## 2021-05-04 DIAGNOSIS — K521 Toxic gastroenteritis and colitis: Secondary | ICD-10-CM | POA: Diagnosis not present

## 2021-05-04 DIAGNOSIS — Z79899 Other long term (current) drug therapy: Secondary | ICD-10-CM | POA: Insufficient documentation

## 2021-05-04 DIAGNOSIS — R197 Diarrhea, unspecified: Secondary | ICD-10-CM | POA: Diagnosis not present

## 2021-05-04 DIAGNOSIS — Z5111 Encounter for antineoplastic chemotherapy: Secondary | ICD-10-CM | POA: Insufficient documentation

## 2021-05-04 DIAGNOSIS — E785 Hyperlipidemia, unspecified: Secondary | ICD-10-CM | POA: Diagnosis not present

## 2021-05-04 DIAGNOSIS — E876 Hypokalemia: Secondary | ICD-10-CM | POA: Diagnosis not present

## 2021-05-04 DIAGNOSIS — Z8261 Family history of arthritis: Secondary | ICD-10-CM | POA: Insufficient documentation

## 2021-05-04 DIAGNOSIS — Z8269 Family history of other diseases of the musculoskeletal system and connective tissue: Secondary | ICD-10-CM | POA: Diagnosis not present

## 2021-05-04 DIAGNOSIS — G479 Sleep disorder, unspecified: Secondary | ICD-10-CM | POA: Insufficient documentation

## 2021-05-04 LAB — COMPREHENSIVE METABOLIC PANEL
ALT: 31 U/L (ref 0–44)
AST: 37 U/L (ref 15–41)
Albumin: 3.5 g/dL (ref 3.5–5.0)
Alkaline Phosphatase: 154 U/L — ABNORMAL HIGH (ref 38–126)
Anion gap: 8 (ref 5–15)
BUN: 8 mg/dL (ref 6–20)
CO2: 26 mmol/L (ref 22–32)
Calcium: 9.1 mg/dL (ref 8.9–10.3)
Chloride: 105 mmol/L (ref 98–111)
Creatinine, Ser: 0.84 mg/dL (ref 0.61–1.24)
GFR, Estimated: >60 mL/min (ref >60)
Glucose, Bld: 127 mg/dL — ABNORMAL HIGH (ref 70–99)
Potassium: 3.1 mmol/L — ABNORMAL LOW (ref 3.5–5.1)
Sodium: 139 mmol/L (ref 135–145)
Total Bilirubin: 1 mg/dL (ref 0.3–1.2)
Total Protein: 6.2 g/dL — ABNORMAL LOW (ref 6.5–8.1)

## 2021-05-04 LAB — CBC WITH DIFFERENTIAL/PLATELET
Abs Immature Granulocytes: 0.06 10*3/uL (ref 0.00–0.07)
Basophils Absolute: 0.1 10*3/uL (ref 0.0–0.1)
Basophils Relative: 1 %
Eosinophils Absolute: 0.1 10*3/uL (ref 0.0–0.5)
Eosinophils Relative: 3 %
HCT: 39.7 % (ref 39.0–52.0)
Hemoglobin: 13.5 g/dL (ref 13.0–17.0)
Immature Granulocytes: 2 %
Lymphocytes Relative: 30 %
Lymphs Abs: 1.1 10*3/uL (ref 0.7–4.0)
MCH: 33.3 pg (ref 26.0–34.0)
MCHC: 34 g/dL (ref 30.0–36.0)
MCV: 97.8 fL (ref 80.0–100.0)
Monocytes Absolute: 0.6 10*3/uL (ref 0.1–1.0)
Monocytes Relative: 15 %
Neutro Abs: 1.9 10*3/uL (ref 1.7–7.7)
Neutrophils Relative %: 49 %
Platelets: 128 10*3/uL — ABNORMAL LOW (ref 150–400)
RBC: 4.06 MIL/uL — ABNORMAL LOW (ref 4.22–5.81)
RDW: 17.8 % — ABNORMAL HIGH (ref 11.5–15.5)
WBC: 3.8 10*3/uL — ABNORMAL LOW (ref 4.0–10.5)
nRBC: 0 % (ref 0.0–0.2)

## 2021-05-04 LAB — MAGNESIUM: Magnesium: 1.9 mg/dL (ref 1.7–2.4)

## 2021-05-04 MED ORDER — DIPHENHYDRAMINE HCL 50 MG/ML IJ SOLN
50.0000 mg | Freq: Once | INTRAMUSCULAR | Status: AC
  Administered 2021-05-04: 50 mg via INTRAVENOUS
  Filled 2021-05-04: qty 1

## 2021-05-04 MED ORDER — LEUCOVORIN CALCIUM INJECTION 350 MG
400.0000 mg/m2 | Freq: Once | INTRAVENOUS | Status: AC
  Administered 2021-05-04: 872 mg via INTRAVENOUS
  Filled 2021-05-04: qty 43.6

## 2021-05-04 MED ORDER — DEXTROSE 5 % IV SOLN: Freq: Once | INTRAVENOUS | Status: AC

## 2021-05-04 MED ORDER — HEPARIN SOD (PORK) LOCK FLUSH 100 UNIT/ML IV SOLN: 500.0000 [IU] | Freq: Once | INTRAVENOUS | Status: AC

## 2021-05-04 MED ORDER — FLUOROURACIL CHEMO INJECTION 2.5 GM/50ML
400.0000 mg/m2 | Freq: Once | INTRAVENOUS | Status: AC
  Administered 2021-05-04: 850 mg via INTRAVENOUS
  Filled 2021-05-04: qty 17

## 2021-05-04 MED ORDER — FAMOTIDINE 20 MG IN NS 100 ML IVPB
20.0000 mg | Freq: Once | INTRAVENOUS | Status: AC
  Administered 2021-05-04: 20 mg via INTRAVENOUS
  Filled 2021-05-04: qty 20

## 2021-05-04 MED ORDER — SODIUM CHLORIDE 0.9 % IV SOLN
10.0000 mg | Freq: Once | INTRAVENOUS | Status: AC
  Administered 2021-05-04: 10 mg via INTRAVENOUS
  Filled 2021-05-04: qty 10

## 2021-05-04 MED ORDER — DEXTROSE 5 % IV SOLN
Freq: Once | INTRAVENOUS | Status: AC
Start: 2021-05-04 — End: 2021-05-04

## 2021-05-04 MED ORDER — OXALIPLATIN CHEMO INJECTION 100 MG/20ML
85.0000 mg/m2 | Freq: Once | INTRAVENOUS | Status: AC
  Administered 2021-05-04: 185 mg via INTRAVENOUS
  Filled 2021-05-04: qty 37

## 2021-05-04 MED ORDER — HEPARIN SOD (PORK) LOCK FLUSH 100 UNIT/ML IV SOLN: 500.0000 [IU] | Freq: Once | INTRAVENOUS | Status: DC | PRN

## 2021-05-04 MED ORDER — SODIUM CHLORIDE 0.9% FLUSH: 10.0000 mL | INTRAVENOUS | Status: DC

## 2021-05-04 MED ORDER — SODIUM CHLORIDE 0.9% FLUSH: 10.0000 mL | INTRAVENOUS | Status: DC | PRN

## 2021-05-04 MED ORDER — SODIUM CHLORIDE 0.9 % IV SOLN
2300.0000 mg/m2 | INTRAVENOUS | Status: DC
  Administered 2021-05-04: 5000 mg via INTRAVENOUS
  Filled 2021-05-04: qty 100

## 2021-05-04 MED ORDER — PALONOSETRON HCL INJECTION 0.25 MG/5ML
0.2500 mg | Freq: Once | INTRAVENOUS | Status: AC
  Administered 2021-05-04: 0.25 mg via INTRAVENOUS
  Filled 2021-05-04: qty 5

## 2021-05-04 NOTE — Patient Instructions (Signed)
[facility]  Discharge Instructions: Thank you for choosing [facility] to provide your oncology and hematology care.  If you have a lab appointment with the [facility], please come in thru the Main Entrance and check in at the main information desk.  Wear comfortable clothing and clothing appropriate for easy access to any Portacath or PICC line.   We strive to give you quality time with your provider. You may need to reschedule your appointment if you arrive late (15 or more minutes).  Arriving late affects you and other patients whose appointments are after yours.  Also, if you miss three or more appointments without notifying the office, you may be dismissed from the clinic at the provider's discretion.      For prescription refill requests, have your pharmacy contact our office and allow 72 hours for refills to be completed.    Today you received the following chemotherapy and/or immunotherapy agents FOLFOX      To help prevent nausea and vomiting after your treatment, we encourage you to take your nausea medication as directed.  BELOW ARE SYMPTOMS THAT SHOULD BE REPORTED IMMEDIATELY: *FEVER GREATER THAN 100.4 F (38 C) OR HIGHER *CHILLS OR SWEATING *NAUSEA AND VOMITING THAT IS NOT CONTROLLED WITH YOUR NAUSEA MEDICATION *UNUSUAL SHORTNESS OF BREATH *UNUSUAL BRUISING OR BLEEDING *URINARY PROBLEMS (pain or burning when urinating, or frequent urination) *BOWEL PROBLEMS (unusual diarrhea, constipation, pain near the anus) TENDERNESS IN MOUTH AND THROAT WITH OR WITHOUT PRESENCE OF ULCERS (sore throat, sores in mouth, or a toothache) UNUSUAL RASH, SWELLING OR PAIN  UNUSUAL VAGINAL DISCHARGE OR ITCHING   Items with * indicate a potential emergency and should be followed up as soon as possible or go to the Emergency Department if any problems should occur.  Please show the CHEMOTHERAPY ALERT CARD or IMMUNOTHERAPY ALERT CARD at check-in to the Emergency  Department and triage nurse.  Should you have questions after your visit or need to cancel or reschedule your appointment, please contact [facility] [phone]  and follow the prompts.  Office hours are 8:00 a.m. to 4:30 p.m. Monday - Friday. Please note that voicemails left after 4:00 p.m. may not be returned until the following business day.  We are closed weekends and major holidays. You have access to a nurse at all times for urgent questions. Please call the main number to the clinic [phone] and follow the prompts.  For any non-urgent questions, you may also contact your provider using MyChart. We now offer e-Visits for anyone 27 and older to request care online for non-urgent symptoms. For details visit [email or web address].   Also download the MyChart app! Go to the app store, search "MyChart", open the app, select [facility], and log in with your MyChart username and password.  Due to Covid, a mask is required upon entering the hospital/clinic. If you do not have a mask, one will be given to you upon arrival. For doctor visits, patients may have 1 support person aged 64 or older with them. For treatment visits, patients cannot have anyone with them due to current Covid guidelines and our immunocompromised population.    Oxaliplatin Injection What is this medication? OXALIPLATIN (ox AL i PLA tin) is a chemotherapy drug. It targets fast dividing cells, like cancer cells, and causes these cells to die. This medicine is usedto treat cancers of the colon and rectum, and many other cancers. This medicine may be used for other purposes; ask your health care  provider orpharmacist if you have questions. COMMON BRAND NAME(S): Eloxatin What should I tell my care team before I take this medication? They need to know if you have any of these conditions: heart disease history of irregular heartbeat liver disease low blood counts, like white cells, platelets, or red blood  cells lung or breathing disease, like asthma take medicines that treat or prevent blood clots tingling of the fingers or toes, or other nerve disorder an unusual or allergic reaction to oxaliplatin, other chemotherapy, other medicines, foods, dyes, or preservatives pregnant or trying to get pregnant breast-feeding How should I use this medication? This drug is given as an infusion into a vein. It is administered in a hospitalor clinic by a specially trained health care professional. Talk to your pediatrician regarding the use of this medicine in children.Special care may be needed. Overdosage: If you think you have taken too much of this medicine contact apoison control center or emergency room at once. NOTE: This medicine is only for you. Do not share this medicine with others. What if I miss a dose? It is important not to miss a dose. Call your doctor or health careprofessional if you are unable to keep an appointment. What may interact with this medication? Do not take this medicine with any of the following medications: cisapride dronedarone pimozide thioridazine This medicine may also interact with the following medications: aspirin and aspirin-like medicines certain medicines that treat or prevent blood clots like warfarin, apixaban, dabigatran, and rivaroxaban cisplatin cyclosporine diuretics medicines for infection like acyclovir, adefovir, amphotericin B, bacitracin, cidofovir, foscarnet, ganciclovir, gentamicin, pentamidine, vancomycin NSAIDs, medicines for pain and inflammation, like ibuprofen or naproxen other medicines that prolong the QT interval (an abnormal heart rhythm) pamidronate zoledronic acid This list may not describe all possible interactions. Give your health care provider a list of all the medicines, herbs, non-prescription drugs, or dietary supplements you use. Also tell them if you smoke, drink alcohol, or use illegaldrugs. Some items may interact with your  medicine. What should I watch for while using this medication? Your condition will be monitored carefully while you are receiving thismedicine. You may need blood work done while you are taking this medicine. This medicine may make you feel generally unwell. This is not uncommon as chemotherapy can affect healthy cells as well as cancer cells. Report any side effects. Continue your course of treatment even though you feel ill unless yourhealthcare professional tells you to stop. This medicine can make you more sensitive to cold. Do not drink cold drinks or use ice. Cover exposed skin before coming in contact with cold temperatures or cold objects. When out in cold weather wear warm clothing and cover your mouth and nose to warm the air that goes into your lungs. Tell your doctor if you getsensitive to the cold. Do not become pregnant while taking this medicine or for 9 months after stopping it. Women should inform their health care professional if they wish to become pregnant or think they might be pregnant. Men should not father a child while taking this medicine and for 6 months after stopping it. There is potential for serious side effects to an unborn child. Talk to your health careprofessional for more information. Do not breast-feed a child while taking this medicine or for 3 months afterstopping it. This medicine has caused ovarian failure in some women. This medicine may make it more difficult to get pregnant. Talk to your health care professional if [place] Sellers concerned about your fertility. This medicine  has caused decreased sperm counts in some men. This may make it more difficult to father a child. Talk to your health care professional if [place] Sellers concerned about your fertility. This medicine may increase your risk of getting an infection. Call your health care professional for advice if you get a fever, chills, or sore throat, or other symptoms of a cold or flu. Do not treat yourself. Try to avoid  beingaround people who are sick. Avoid taking medicines that contain aspirin, acetaminophen, ibuprofen, naproxen, or ketoprofen unless instructed by your health care professional.These medicines may hide a fever. Be careful brushing or flossing your teeth or using a toothpick because you may get an infection or bleed more easily. If you have any dental work done, Primary school teacher you are receiving this medicine. What side effects may I notice from receiving this medication? Side effects that you should report to your doctor or health care professionalas soon as possible: allergic reactions like skin rash, itching or hives, swelling of the face, lips, or tongue breathing problems cough low blood counts - this medicine may decrease the number of white blood cells, red blood cells, and platelets. You may be at increased risk for infections and bleeding nausea, vomiting pain, redness, or irritation at site where injected pain, tingling, numbness in the hands or feet signs and symptoms of bleeding such as bloody or black, tarry stools; red or dark brown urine; spitting up blood or brown material that looks like coffee grounds; red spots on the skin; unusual bruising or bleeding from the eyes, gums, or nose signs and symptoms of a dangerous change in heartbeat or heart rhythm like chest pain; dizziness; fast, irregular heartbeat; palpitations; feeling faint or lightheaded; falls signs and symptoms of infection like fever; chills; cough; sore throat; pain or trouble passing urine signs and symptoms of liver injury like dark yellow or brown urine; general ill feeling or flu-like symptoms; light-colored stools; loss of appetite; nausea; right upper belly pain; unusually weak or tired; yellowing of the eyes or skin signs and symptoms of low red blood cells or anemia such as unusually weak or tired; feeling faint or lightheaded; falls signs and symptoms of muscle injury like dark urine; trouble passing urine  or change in the amount of urine; unusually weak or tired; muscle pain; back pain Side effects that usually do not require medical attention (report to yourdoctor or health care professional if they continue or are bothersome): changes in taste diarrhea gas hair loss loss of appetite mouth sores This list may not describe all possible side effects. Call your doctor for medical advice about side effects. You may report side effects to FDA at1-800-FDA-1088. Where should I keep my medication? This drug is given in a hospital or clinic and will not be stored at home. NOTE: This sheet is a summary. It may not cover all possible information. If you have questions about this medicine, talk to your doctor, pharmacist, orhealth care provider.  2022 Elsevier/Gold Standard (2019-03-05 12:20:35)   Leucovorin injection What is this medication? LEUCOVORIN (loo koe VOR in) is used to prevent or treat the harmful effects of some medicines. This medicine is used to treat anemia caused by a low amount of folic acid in the body. It is also used with 5-fluorouracil (5-FU) to treatcolon cancer. This medicine may be used for other purposes; ask your health care provider orpharmacist if you have questions. What should I tell my care team before I take this medication? They need to know  if you have any of these conditions: anemia from low levels of vitamin B-12 in the blood an unusual or allergic reaction to leucovorin, folic acid, other medicines, foods, dyes, or preservatives pregnant or trying to get pregnant breast-feeding How should I use this medication? This medicine is for injection into a muscle or into a vein. It is given by ahealth care professional in a hospital or clinic setting. Talk to your pediatrician regarding the use of this medicine in children.Special care may be needed. Overdosage: If you think you have taken too much of this medicine contact apoison control center or emergency room at  once. NOTE: This medicine is only for you. Do not share this medicine with others. What if I miss a dose? This does not apply. What may interact with this medication? capecitabine fluorouracil phenobarbital phenytoin primidone trimethoprim-sulfamethoxazole This list may not describe all possible interactions. Give your health care provider a list of all the medicines, herbs, non-prescription drugs, or dietary supplements you use. Also tell them if you smoke, drink alcohol, or use illegaldrugs. Some items may interact with your medicine. What should I watch for while using this medication? Your condition will be monitored carefully while you are receiving thismedicine. This medicine may increase the side effects of 5-fluorouracil, 5-FU. Tell your doctor or health care professional if you have diarrhea or mouth sores that donot get better or that get worse. What side effects may I notice from receiving this medication? Side effects that you should report to your doctor or health care professionalas soon as possible: allergic reactions like skin rash, itching or hives, swelling of the face, lips, or tongue breathing problems fever, infection mouth sores unusual bleeding or bruising unusually weak or tired Side effects that usually do not require medical attention (report to yourdoctor or health care professional if they continue or are bothersome): constipation or diarrhea loss of appetite nausea, vomiting This list may not describe all possible side effects. Call your doctor for medical advice about side effects. You may report side effects to FDA at1-800-FDA-1088. Where should I keep my medication? This drug is given in a hospital or clinic and will not be stored at home. NOTE: This sheet is a summary. It may not cover all possible information. If you have questions about this medicine, talk to your doctor, pharmacist, orhealth care provider.  2022 Elsevier/Gold Standard (2008-04-21  16:50:29)   Fluorouracil, 5-FU injection What is this medication? FLUOROURACIL, 5-FU (flure oh YOOR a sil) is a chemotherapy drug. It slows the growth of cancer cells. This medicine is used to treat many types of cancer like breast cancer, colon or rectal cancer, pancreatic cancer, and stomachcancer. This medicine may be used for other purposes; ask your health care provider orpharmacist if you have questions. COMMON BRAND NAME(S): Adrucil What should I tell my care team before I take this medication? They need to know if you have any of these conditions: blood disorders dihydropyrimidine dehydrogenase (DPD) deficiency infection (especially a virus infection such as chickenpox, cold sores, or herpes) kidney disease liver disease malnourished, poor nutrition recent or ongoing radiation therapy an unusual or allergic reaction to fluorouracil, other chemotherapy, other medicines, foods, dyes, or preservatives pregnant or trying to get pregnant breast-feeding How should I use this medication? This drug is given as an infusion or injection into a vein. It is administeredin a hospital or clinic by a specially trained health care professional. Talk to your pediatrician regarding the use of this medicine in children.Special care may  be needed. Overdosage: If you think you have taken too much of this medicine contact apoison control center or emergency room at once. NOTE: This medicine is only for you. Do not share this medicine with others. What if I miss a dose? It is important not to miss your dose. Call your doctor or health careprofessional if you are unable to keep an appointment. What may interact with this medication? Do not take this medicine with any of the following medications: live virus vaccines This medicine may also interact with the following medications: medicines that treat or prevent blood clots like warfarin, enoxaparin, and dalteparin This list may not describe all  possible interactions. Give your health care provider a list of all the medicines, herbs, non-prescription drugs, or dietary supplements you use. Also tell them if you smoke, drink alcohol, or use illegaldrugs. Some items may interact with your medicine. What should I watch for while using this medication? Visit your doctor for checks on your progress. This drug may make you feel generally unwell. This is not uncommon, as chemotherapy can affect healthy cells as well as cancer cells. Report any side effects. Continue your course oftreatment even though you feel ill unless your doctor tells you to stop. In some cases, you may be given additional medicines to help with side effects.Follow all directions for their use. Call your doctor or health care professional for advice if you get a fever, chills or sore throat, or other symptoms of a cold or flu. Do not treat yourself. This drug decreases your body's ability to fight infections. Try toavoid being around people who are sick. This medicine may increase your risk to bruise or bleed. Call your doctor orhealth care professional if you notice any unusual bleeding. Be careful brushing and flossing your teeth or using a toothpick because you may get an infection or bleed more easily. If you have any dental work done,tell your dentist you are receiving this medicine. Avoid taking products that contain aspirin, acetaminophen, ibuprofen, naproxen, or ketoprofen unless instructed by your doctor. These medicines may hide afever. Do not become pregnant while taking this medicine. Women should inform their doctor if they wish to become pregnant or think they might be pregnant. There is a potential for serious side effects to an unborn child. Talk to your health care professional or pharmacist for more information. Do not breast-feed aninfant while taking this medicine. Men should inform their doctor if they wish to father a child. This medicinemay lower sperm  counts. Do not treat diarrhea with over the counter products. Contact your doctor ifyou have diarrhea that lasts more than 2 days or if it is severe and watery. This medicine can make you more sensitive to the sun. Keep out of the sun. If you cannot avoid being in the sun, wear protective clothing and use sunscreen.Do not use sun lamps or tanning beds/booths. What side effects may I notice from receiving this medication? Side effects that you should report to your doctor or health care professionalas soon as possible: allergic reactions like skin rash, itching or hives, swelling of the face, lips, or tongue low blood counts - this medicine may decrease the number of white blood cells, red blood cells and platelets. You may be at increased risk for infections and bleeding. signs of infection - fever or chills, cough, sore throat, pain or difficulty passing urine signs of decreased platelets or bleeding - bruising, pinpoint red spots on the skin, black, tarry stools, blood in the urine  signs of decreased red blood cells - unusually weak or tired, fainting spells, lightheadedness breathing problems changes in vision chest pain mouth sores nausea and vomiting pain, swelling, redness at site where injected pain, tingling, numbness in the hands or feet redness, swelling, or sores on hands or feet stomach pain unusual bleeding Side effects that usually do not require medical attention (report to yourdoctor or health care professional if they continue or are bothersome): changes in finger or toe nails diarrhea dry or itchy skin hair loss headache loss of appetite sensitivity of eyes to the light stomach upset unusually teary eyes This list may not describe all possible side effects. Call your doctor for medical advice about side effects. You may report side effects to FDA at1-800-FDA-1088. Where should I keep my medication? This drug is given in a hospital or clinic and will not be stored at  home. NOTE: This sheet is a summary. It may not cover all possible information. If you have questions about this medicine, talk to your doctor, pharmacist, orhealth care provider.  2022 Elsevier/Gold Standard (2019-09-16 15:00:03)

## 2021-05-04 NOTE — Progress Notes (Signed)
Pt presents for treatment today, pt receiving Folfox. Pt potassium was 3.1 and Alkaline phosphatase 154. Pt okay for treatment per Dr. Alvy Bimler.

## 2021-05-04 NOTE — Progress Notes (Signed)
Pt tolerated treatment well today without incidence. Stable during and after treatment.  Vital signs stable prior to discharge.  AVS reviewed. Discharged in stable condition ambulatory with ambulatory 31fu pump

## 2021-05-04 NOTE — Progress Notes (Signed)
[facility] OFFICE PROGRESS NOTE  Patient Care Team: Dettinger, Fransisca Kaufmann, MD as PCP - General (Family Medicine) Cameron Sprang, MD as Consulting Physician (Neurology) Brien Mates, RN as Oncology Nurse Navigator (Oncology)  ASSESSMENT & PLAN:  Colon cancer, ascending ([place]) Overall, he tolerated treatment fairly well with mild diarrhea, cold intolerance, mild hypokalemia and persistent neuropathy His blood counts are satisfactory He will proceed with treatment as scheduled We will try to get his CT scan authorized and done before his next visit for objective assessment of response to therapy  Diarrhea due to drug This is likely due to side effects of chemotherapy I recommend the patient to take scheduled Imodium to prevent diarrhea  Hypokalemia due to excessive gastrointestinal loss of potassium This is likely due to side effects of chemotherapy I recommend the patient to take scheduled Imodium to prevent diarrhea  Pancytopenia, acquired ([place]) This is due to side effects of treatment He is not symptomatic Observe only We will proceed with treatment without delay  No orders of the defined types were placed in this encounter.   All questions were answered. The patient knows to call the clinic with any problems, questions or concerns. The total time spent in the appointment was 20 minutes encounter with patients including review of chart and various tests results, discussions about plan of care and coordination of care plan   Heath Lark, MD 05/04/2021 8:19 AM  INTERVAL HISTORY: Please see below for problem oriented charting. He returns for chemotherapy and follow-up We try to arrange for CT scan to be done 2 weeks ago but his insurance denied the procedure He tolerated last cycle of treatment well He has mild persistent diarrhea He has not been taking Imodium on a regular basis He has cold sensitivity in his mouth Denies worsening peripheral  neuropathy  SUMMARY OF ONCOLOGIC HISTORY: Oncology History  Colon cancer, ascending ([place])  11/10/2020 Initial Diagnosis   Colon cancer, ascending ([place])    01/18/2021 Cancer Staging   Staging form: Colon and Rectum, AJCC 8th Edition - Pathologic stage from 01/18/2021: Stage IIIC (pT4a, pN2b, cM0) - Signed by Derek Jack, MD on 01/18/2021  Stage prefix: Initial diagnosis    01/25/2021 -  Chemotherapy    Patient is on Treatment Plan: COLORECTAL FOLFOX Q14D X 6 MONTHS         REVIEW OF SYSTEMS:   Constitutional: Denies fevers, chills or abnormal weight loss Eyes: Denies blurriness of vision Ears, nose, mouth, throat, and face: Denies mucositis or sore throat Respiratory: Denies cough, dyspnea or wheezes Cardiovascular: Denies palpitation, chest discomfort or lower extremity swelling Skin: Denies abnormal skin rashes Lymphatics: Denies new lymphadenopathy or easy bruising Neurological:Denies numbness, tingling or new weaknesses Behavioral/Psych: Mood is stable, no new changes  All other systems were reviewed with the patient and are negative.  I have reviewed the past medical history, past surgical history, social history and family history with the patient and they are unchanged from previous note.  ALLERGIES:  is allergic to penicillins, furosemide, streptomycin, and sulfa antibiotics.  MEDICATIONS:  Current Outpatient Medications  Medication Sig Dispense Refill   ferrous sulfate 325 (65 FE) MG tablet Take 325 mg by mouth daily.     fluorouracil CALGB 45409 2,400 mg/m2 in sodium chloride 0.9 % 150 mL Inject 1,920 mg/m2 into the vein over 48 hr. Every 14 days x 12 cycles     FLUOROURACIL IV Inject 320 mg/m2 into the vein every 14 (fourteen) days.  LamoTRIgine 300 MG TB24 24 hour tablet Take 1 tablet every night 90 tablet 3   LEUCOVORIN CALCIUM IV Inject into the vein every 14 (fourteen) days.     levETIRAcetam (KEPPRA XR) 500 MG 24 hr tablet Take 2 tablets (1,000 mg  total) by mouth at bedtime. 180 tablet 3   lisinopril (ZESTRIL) 10 MG tablet TAKE 1 TABLET BY MOUTH EVERY DAY 90 tablet 0   oxaliplatin in dextrose 5 % 500 mL Inject into the vein every 14 (fourteen) days.     potassium chloride SA (KLOR-CON) 20 MEQ tablet Take 1 tablet (20 mEq total) by mouth daily. 90 tablet 0   pravastatin (PRAVACHOL) 40 MG tablet TAKE 1 TABLET BY MOUTH EVERY DAY 90 tablet 0   pseudoephedrine (SUDAFED) 120 MG 12 hr tablet Take 120 mg by mouth every 12 (twelve) hours as needed for congestion.     lidocaine-prilocaine (EMLA) cream Apply a small amount to port a cath site and cover with plastic wrap 1 hour prior to infusion appointments (Patient not taking: Reported on 05/04/2021) 30 g 3   prochlorperazine (COMPAZINE) 10 MG tablet Take 1 tablet (10 mg total) by mouth every 6 (six) hours as needed (Nausea or vomiting). (Patient not taking: Reported on 05/04/2021) 30 tablet 1   No current facility-administered medications for this visit.    PHYSICAL EXAMINATION: ECOG PERFORMANCE STATUS: 1 - Symptomatic but completely ambulatory  Vitals:   05/04/21 0754  BP: 120/68  Pulse: 78  Resp: 18  Temp: (!) 97.2 F (36.2 C)  SpO2: 98%   Filed Weights   05/04/21 0754  Weight: 217 lb 14.4 oz (98.8 kg)    GENERAL:alert, no distress and comfortable SKIN: skin color, texture, turgor are normal, no rashes or significant lesions EYES: normal, Conjunctiva are pink and non-injected, sclera clear OROPHARYNX:no exudate, no erythema and lips, buccal mucosa, and tongue normal  NECK: supple, thyroid normal size, non-tender, without nodularity LYMPH:  no palpable lymphadenopathy in the cervical, axillary or inguinal LUNGS: clear to auscultation and percussion with normal breathing effort HEART: regular rate & rhythm and no murmurs and no lower extremity edema ABDOMEN:abdomen soft, non-tender and normal bowel sounds Musculoskeletal:no cyanosis of digits and no clubbing  NEURO: alert & oriented  x 3 with fluent speech, no focal motor/sensory deficits  LABORATORY DATA:  I have reviewed the data as listed    Component Value Date/Time   NA 138 04/20/2021 0753   NA 139 03/31/2020 1534   K 3.1 (L) 04/20/2021 0753   CL 105 04/20/2021 0753   CO2 24 04/20/2021 0753   GLUCOSE 135 (H) 04/20/2021 0753   BUN 8 04/20/2021 0753   BUN 11 03/31/2020 1534   CREATININE 0.91 04/20/2021 0753   CALCIUM 9.1 04/20/2021 0753   PROT 6.6 04/20/2021 0753   PROT 6.7 03/31/2020 1534   ALBUMIN 3.7 04/20/2021 0753   ALBUMIN 4.3 03/31/2020 1534   AST 44 (H) 04/20/2021 0753   ALT 39 04/20/2021 0753   ALKPHOS 145 (H) 04/20/2021 0753   BILITOT 0.6 04/20/2021 0753   BILITOT 0.6 03/31/2020 1534   GFRNONAA >60 04/20/2021 0753   GFRAA 108 03/31/2020 1534    No results found for: SPEP, UPEP  Lab Results  Component Value Date   WBC 3.8 (L) 05/04/2021   NEUTROABS 1.9 05/04/2021   HGB 13.5 05/04/2021   HCT 39.7 05/04/2021   MCV 97.8 05/04/2021   PLT 128 (L) 05/04/2021      Chemistry  Component Value Date/Time   NA 138 04/20/2021 0753   NA 139 03/31/2020 1534   K 3.1 (L) 04/20/2021 0753   CL 105 04/20/2021 0753   CO2 24 04/20/2021 0753   BUN 8 04/20/2021 0753   BUN 11 03/31/2020 1534   CREATININE 0.91 04/20/2021 0753      Component Value Date/Time   CALCIUM 9.1 04/20/2021 0753   ALKPHOS 145 (H) 04/20/2021 0753   AST 44 (H) 04/20/2021 0753   ALT 39 04/20/2021 0753   BILITOT 0.6 04/20/2021 0753   BILITOT 0.6 03/31/2020 1534

## 2021-05-04 NOTE — Progress Notes (Deleted)
Pt presents today for treatment pt receiving Taxol/Carbo.

## 2021-05-04 NOTE — Assessment & Plan Note (Signed)
Overall, he tolerated treatment fairly well with mild diarrhea, cold intolerance, mild hypokalemia and persistent neuropathy His blood counts are satisfactory He will proceed with treatment as scheduled We will try to get his CT scan authorized and done before his next visit for objective assessment of response to therapy

## 2021-05-04 NOTE — Progress Notes (Unsigned)
eacess

## 2021-05-04 NOTE — Assessment & Plan Note (Signed)
This is due to side effects of treatment He is not symptomatic Observe only We will proceed with treatment without delay

## 2021-05-04 NOTE — Assessment & Plan Note (Signed)
This is likely due to side effects of chemotherapy I recommend the patient to take scheduled Imodium to prevent diarrhea

## 2021-05-06 ENCOUNTER — Other Ambulatory Visit: Payer: Self-pay

## 2021-05-06 ENCOUNTER — Inpatient Hospital Stay (HOSPITAL_COMMUNITY): Payer: BLUE CROSS/BLUE SHIELD

## 2021-05-06 VITALS — BP 115/78 | HR 96 | Temp 96.8°F | Resp 18

## 2021-05-06 DIAGNOSIS — R197 Diarrhea, unspecified: Secondary | ICD-10-CM | POA: Diagnosis not present

## 2021-05-06 DIAGNOSIS — Z95828 Presence of other vascular implants and grafts: Secondary | ICD-10-CM

## 2021-05-06 DIAGNOSIS — R200 Anesthesia of skin: Secondary | ICD-10-CM | POA: Diagnosis not present

## 2021-05-06 DIAGNOSIS — Z79899 Other long term (current) drug therapy: Secondary | ICD-10-CM | POA: Diagnosis not present

## 2021-05-06 DIAGNOSIS — R599 Enlarged lymph nodes, unspecified: Secondary | ICD-10-CM | POA: Diagnosis not present

## 2021-05-06 DIAGNOSIS — Z5189 Encounter for other specified aftercare: Secondary | ICD-10-CM | POA: Diagnosis not present

## 2021-05-06 DIAGNOSIS — Z832 Family history of diseases of the blood and blood-forming organs and certain disorders involving the immune mechanism: Secondary | ICD-10-CM | POA: Diagnosis not present

## 2021-05-06 DIAGNOSIS — E785 Hyperlipidemia, unspecified: Secondary | ICD-10-CM | POA: Diagnosis not present

## 2021-05-06 DIAGNOSIS — Z8249 Family history of ischemic heart disease and other diseases of the circulatory system: Secondary | ICD-10-CM | POA: Diagnosis not present

## 2021-05-06 DIAGNOSIS — G479 Sleep disorder, unspecified: Secondary | ICD-10-CM | POA: Diagnosis not present

## 2021-05-06 DIAGNOSIS — N400 Enlarged prostate without lower urinary tract symptoms: Secondary | ICD-10-CM | POA: Diagnosis not present

## 2021-05-06 DIAGNOSIS — R5383 Other fatigue: Secondary | ICD-10-CM | POA: Diagnosis not present

## 2021-05-06 DIAGNOSIS — Z8261 Family history of arthritis: Secondary | ICD-10-CM | POA: Diagnosis not present

## 2021-05-06 DIAGNOSIS — E876 Hypokalemia: Secondary | ICD-10-CM | POA: Diagnosis not present

## 2021-05-06 DIAGNOSIS — C182 Malignant neoplasm of ascending colon: Secondary | ICD-10-CM | POA: Diagnosis not present

## 2021-05-06 DIAGNOSIS — Z5111 Encounter for antineoplastic chemotherapy: Secondary | ICD-10-CM | POA: Diagnosis not present

## 2021-05-06 MED ORDER — SODIUM CHLORIDE 0.9% FLUSH
10.0000 mL | INTRAVENOUS | Status: DC | PRN
  Administered 2021-05-06: 10 mL

## 2021-05-06 MED ORDER — HEPARIN SOD (PORK) LOCK FLUSH 100 UNIT/ML IV SOLN
500.0000 [IU] | Freq: Once | INTRAVENOUS | Status: AC | PRN
  Administered 2021-05-06: 500 [IU]

## 2021-05-06 MED ORDER — PEGFILGRASTIM-CBQV 6 MG/0.6ML ~~LOC~~ SOSY
6.0000 mg | PREFILLED_SYRINGE | Freq: Once | SUBCUTANEOUS | Status: AC
  Administered 2021-05-06: 6 mg via SUBCUTANEOUS
  Filled 2021-05-06: qty 0.6

## 2021-05-06 NOTE — Patient Instructions (Signed)
Carlos Miranda  Discharge Instructions: Thank you for choosing [facility] to provide your oncology and hematology care.  If you have a lab appointment with the [facility], please come in thru the Main Entrance and check in at the main information desk.  Wear comfortable clothing and clothing appropriate for easy access to any Portacath or PICC line.   We strive to give you quality time with your provider. You may need to reschedule your appointment if you arrive late (15 or more minutes).  Arriving late affects you and other patients whose appointments are after yours.  Also, if you miss three or more appointments without notifying the office, you may be dismissed from the clinic at the provider's discretion.      For prescription refill requests, have your pharmacy contact our office and allow 72 hours for refills to be completed.    Today you received Udenyca injection and pump d/c.  BELOW ARE SYMPTOMS THAT SHOULD BE REPORTED IMMEDIATELY: *FEVER GREATER THAN 100.4 F (38 C) OR HIGHER *CHILLS OR SWEATING *NAUSEA AND VOMITING THAT IS NOT CONTROLLED WITH YOUR NAUSEA MEDICATION *UNUSUAL SHORTNESS OF BREATH *UNUSUAL BRUISING OR BLEEDING *URINARY PROBLEMS (pain or burning when urinating, or frequent urination) *BOWEL PROBLEMS (unusual diarrhea, constipation, pain near the anus) TENDERNESS IN MOUTH AND THROAT WITH OR WITHOUT PRESENCE OF ULCERS (sore throat, sores in mouth, or a toothache) UNUSUAL RASH, SWELLING OR PAIN  UNUSUAL VAGINAL DISCHARGE OR ITCHING   Items with * indicate a potential emergency and should be followed up as soon as possible or go to the Emergency Department if any problems should occur.  Please show the CHEMOTHERAPY ALERT CARD or IMMUNOTHERAPY ALERT CARD at check-in to the Emergency Department and triage nurse.  Should you have questions after your visit or need to cancel or reschedule your appointment, please contact [facility]  [phone]  and follow the prompts.  Office hours are 8:00 a.m. to 4:30 p.m. Monday - Friday. Please note that voicemails left after 4:00 p.m. may not be returned until the following business day.  We are closed weekends and major holidays. You have access to a nurse at all times for urgent questions. Please call the main number to the clinic [phone] and follow the prompts.  For any non-urgent questions, you may also contact your provider using MyChart. We now offer e-Visits for anyone 59 and older to request care online for non-urgent symptoms. For details visit [email or web address].   Also download the MyChart app! Go to the app store, search "MyChart", open the app, select , and log in with your MyChart username and password.  Due to Covid, a mask is required upon entering the hospital/clinic. If you do not have a mask, one will be given to you upon arrival. For doctor visits, patients may have 1 support person aged 34 or older with them. For treatment visits, patients cannot have anyone with them due to current Covid guidelines and our immunocompromised population.

## 2021-05-06 NOTE — Progress Notes (Signed)
.  Cory Munch presents today for injection per the provider's orders.  Udenyca  administration without incident; injection site WNL; see MAR for injection details.  Patient tolerated procedure well and without incident.  No questions or complaints noted at this time. 5FU ambulatory pump discontinued. Pt stable during and after treatment. Pt discharged ambulatory in satisfactory condition.

## 2021-05-09 ENCOUNTER — Encounter: Payer: BLUE CROSS/BLUE SHIELD | Admitting: Family Medicine

## 2021-05-11 ENCOUNTER — Inpatient Hospital Stay: Admission: RE | Admit: 2021-05-11 | Payer: BLUE CROSS/BLUE SHIELD | Source: Ambulatory Visit

## 2021-05-12 ENCOUNTER — Ambulatory Visit
Admission: RE | Admit: 2021-05-12 | Discharge: 2021-05-12 | Disposition: A | Discharge status: Home / Self Care | Payer: BLUE CROSS/BLUE SHIELD | Source: Ambulatory Visit | Attending: Hematology | Admitting: Hematology

## 2021-05-12 ENCOUNTER — Other Ambulatory Visit: Payer: Self-pay

## 2021-05-12 DIAGNOSIS — C189 Malignant neoplasm of colon, unspecified: Secondary | ICD-10-CM | POA: Diagnosis not present

## 2021-05-12 DIAGNOSIS — Z9049 Acquired absence of other specified parts of digestive tract: Secondary | ICD-10-CM | POA: Diagnosis not present

## 2021-05-12 DIAGNOSIS — R918 Other nonspecific abnormal finding of lung field: Secondary | ICD-10-CM | POA: Diagnosis not present

## 2021-05-12 DIAGNOSIS — K7689 Other specified diseases of liver: Secondary | ICD-10-CM | POA: Diagnosis not present

## 2021-05-12 DIAGNOSIS — C182 Malignant neoplasm of ascending colon: Secondary | ICD-10-CM

## 2021-05-12 DIAGNOSIS — M5134 Other intervertebral disc degeneration, thoracic region: Secondary | ICD-10-CM | POA: Diagnosis not present

## 2021-05-12 DIAGNOSIS — N400 Enlarged prostate without lower urinary tract symptoms: Secondary | ICD-10-CM | POA: Diagnosis not present

## 2021-05-12 MED ORDER — SODIUM CHLORIDE 0.9% FLUSH
10.0000 mL | INTRAVENOUS | Status: DC | PRN
  Administered 2021-05-12: 10 mL via INTRAVENOUS

## 2021-05-12 MED ORDER — HEPARIN SOD (PORK) LOCK FLUSH 100 UNIT/ML IV SOLN
500.0000 [IU] | Freq: Once | INTRAVENOUS | Status: AC
  Administered 2021-05-12: 500 [IU] via INTRAVENOUS

## 2021-05-12 MED ORDER — IOPAMIDOL (ISOVUE-300) INJECTION 61%
100.0000 mL | Freq: Once | INTRAVENOUS | Status: AC | PRN
  Administered 2021-05-12: 100 mL via INTRAVENOUS

## 2021-05-13 ENCOUNTER — Ambulatory Visit (INDEPENDENT_AMBULATORY_CARE_PROVIDER_SITE_OTHER): Payer: BLUE CROSS/BLUE SHIELD | Admitting: Neurology

## 2021-05-13 ENCOUNTER — Encounter: Payer: Self-pay | Admitting: Neurology

## 2021-05-13 VITALS — BP 110/78 | HR 98 | Ht 68.0 in | Wt 212.0 lb

## 2021-05-13 DIAGNOSIS — G40009 Localization-related (focal) (partial) idiopathic epilepsy and epileptic syndromes with seizures of localized onset, not intractable, without status epilepticus: Secondary | ICD-10-CM | POA: Diagnosis not present
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
## 2021-05-17 ENCOUNTER — Telehealth: Payer: Self-pay | Admitting: Neurology

## 2021-05-17 NOTE — Telephone Encounter (Signed)
Patient called in stating he would like to see if he could get a prescription sent in for the liquid form of the Keppra? The tablet form is hard for him to swallow. He would like it sent to the [facility] in [place]
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
## 2021-05-18 ENCOUNTER — Inpatient Hospital Stay (HOSPITAL_BASED_OUTPATIENT_CLINIC_OR_DEPARTMENT_OTHER): Payer: BLUE CROSS/BLUE SHIELD | Admitting: Hematology

## 2021-05-18 ENCOUNTER — Inpatient Hospital Stay (HOSPITAL_COMMUNITY): Payer: BLUE CROSS/BLUE SHIELD

## 2021-05-18 ENCOUNTER — Other Ambulatory Visit: Payer: Self-pay

## 2021-05-18 VITALS — BP 122/75 | HR 73 | Temp 98.2°F | Resp 18

## 2021-05-18 VITALS — BP 135/85 | HR 83 | Temp 97.0°F | Resp 18 | Wt 214.6 lb

## 2021-05-18 DIAGNOSIS — E876 Hypokalemia: Secondary | ICD-10-CM | POA: Diagnosis not present

## 2021-05-18 DIAGNOSIS — Z79899 Other long term (current) drug therapy: Secondary | ICD-10-CM | POA: Diagnosis not present

## 2021-05-18 DIAGNOSIS — C182 Malignant neoplasm of ascending colon: Secondary | ICD-10-CM

## 2021-05-18 DIAGNOSIS — N400 Enlarged prostate without lower urinary tract symptoms: Secondary | ICD-10-CM | POA: Diagnosis not present

## 2021-05-18 DIAGNOSIS — Z832 Family history of diseases of the blood and blood-forming organs and certain disorders involving the immune mechanism: Secondary | ICD-10-CM | POA: Diagnosis not present

## 2021-05-18 DIAGNOSIS — Z8249 Family history of ischemic heart disease and other diseases of the circulatory system: Secondary | ICD-10-CM | POA: Diagnosis not present

## 2021-05-18 DIAGNOSIS — Z8261 Family history of arthritis: Secondary | ICD-10-CM | POA: Diagnosis not present

## 2021-05-18 DIAGNOSIS — Z5189 Encounter for other specified aftercare: Secondary | ICD-10-CM | POA: Diagnosis not present

## 2021-05-18 DIAGNOSIS — R5383 Other fatigue: Secondary | ICD-10-CM | POA: Diagnosis not present

## 2021-05-18 DIAGNOSIS — R599 Enlarged lymph nodes, unspecified: Secondary | ICD-10-CM | POA: Diagnosis not present

## 2021-05-18 DIAGNOSIS — E785 Hyperlipidemia, unspecified: Secondary | ICD-10-CM | POA: Diagnosis not present

## 2021-05-18 DIAGNOSIS — R200 Anesthesia of skin: Secondary | ICD-10-CM | POA: Diagnosis not present

## 2021-05-18 DIAGNOSIS — R197 Diarrhea, unspecified: Secondary | ICD-10-CM | POA: Diagnosis not present

## 2021-05-18 DIAGNOSIS — G479 Sleep disorder, unspecified: Secondary | ICD-10-CM | POA: Diagnosis not present

## 2021-05-18 DIAGNOSIS — Z5111 Encounter for antineoplastic chemotherapy: Secondary | ICD-10-CM | POA: Diagnosis not present

## 2021-05-18 DIAGNOSIS — Z95828 Presence of other vascular implants and grafts: Secondary | ICD-10-CM

## 2021-05-18 LAB — CBC WITH DIFFERENTIAL/PLATELET
Abs Immature Granulocytes: 0.02 10*3/uL (ref 0.00–0.07)
Basophils Absolute: 0 10*3/uL (ref 0.0–0.1)
Basophils Relative: 1 %
Eosinophils Absolute: 0.1 10*3/uL (ref 0.0–0.5)
Eosinophils Relative: 4 %
HCT: 39.1 % (ref 39.0–52.0)
Hemoglobin: 13.3 g/dL (ref 13.0–17.0)
Immature Granulocytes: 1 %
Lymphocytes Relative: 33 %
Lymphs Abs: 1.1 10*3/uL (ref 0.7–4.0)
MCH: 34.3 pg — ABNORMAL HIGH (ref 26.0–34.0)
MCHC: 34 g/dL (ref 30.0–36.0)
MCV: 100.8 fL — ABNORMAL HIGH (ref 80.0–100.0)
Monocytes Absolute: 0.4 10*3/uL (ref 0.1–1.0)
Monocytes Relative: 12 %
Neutro Abs: 1.6 10*3/uL — ABNORMAL LOW (ref 1.7–7.7)
Neutrophils Relative %: 49 %
Platelets: 142 10*3/uL — ABNORMAL LOW (ref 150–400)
RBC: 3.88 MIL/uL — ABNORMAL LOW (ref 4.22–5.81)
RDW: 16.6 % — ABNORMAL HIGH (ref 11.5–15.5)
WBC: 3.2 10*3/uL — ABNORMAL LOW (ref 4.0–10.5)
nRBC: 0 % (ref 0.0–0.2)

## 2021-05-18 LAB — COMPREHENSIVE METABOLIC PANEL
ALT: 36 U/L (ref 0–44)
AST: 44 U/L — ABNORMAL HIGH (ref 15–41)
Albumin: 3.7 g/dL (ref 3.5–5.0)
Alkaline Phosphatase: 162 U/L — ABNORMAL HIGH (ref 38–126)
Anion gap: 9 (ref 5–15)
BUN: 7 mg/dL (ref 6–20)
CO2: 26 mmol/L (ref 22–32)
Calcium: 8.9 mg/dL (ref 8.9–10.3)
Chloride: 104 mmol/L (ref 98–111)
Creatinine, Ser: 0.83 mg/dL (ref 0.61–1.24)
GFR, Estimated: >60 mL/min (ref >60)
Glucose, Bld: 136 mg/dL — ABNORMAL HIGH (ref 70–99)
Potassium: 3.1 mmol/L — ABNORMAL LOW (ref 3.5–5.1)
Sodium: 139 mmol/L (ref 135–145)
Total Bilirubin: 0.8 mg/dL (ref 0.3–1.2)
Total Protein: 6.5 g/dL (ref 6.5–8.1)

## 2021-05-18 LAB — MAGNESIUM: Magnesium: 1.6 mg/dL — ABNORMAL LOW (ref 1.7–2.4)

## 2021-05-18 MED ORDER — SODIUM CHLORIDE 0.9 % IV SOLN
2300.0000 mg/m2 | INTRAVENOUS | Status: DC
  Administered 2021-05-18: 5000 mg via INTRAVENOUS
  Filled 2021-05-18: qty 100

## 2021-05-18 MED ORDER — OXALIPLATIN CHEMO INJECTION 100 MG/20ML
68.0000 mg/m2 | Freq: Once | INTRAVENOUS | Status: AC
  Administered 2021-05-18: 150 mg via INTRAVENOUS
  Filled 2021-05-18: qty 20

## 2021-05-18 MED ORDER — DEXTROSE 5 % IV SOLN: Freq: Once | INTRAVENOUS | Status: AC

## 2021-05-18 MED ORDER — MAGNESIUM SULFATE 2 GM/50ML IV SOLN
2.0000 g | Freq: Once | INTRAVENOUS | Status: DC
  Filled 2021-05-18: qty 50

## 2021-05-18 MED ORDER — LEUCOVORIN CALCIUM INJECTION 350 MG
400.0000 mg/m2 | Freq: Once | INTRAVENOUS | Status: AC
  Administered 2021-05-18: 872 mg via INTRAVENOUS
  Filled 2021-05-18: qty 43.6

## 2021-05-18 MED ORDER — PALONOSETRON HCL INJECTION 0.25 MG/5ML
0.2500 mg | Freq: Once | INTRAVENOUS | Status: AC
  Administered 2021-05-18: 0.25 mg via INTRAVENOUS
  Filled 2021-05-18: qty 5

## 2021-05-18 MED ORDER — FAMOTIDINE 20 MG IN NS 100 ML IVPB
20.0000 mg | Freq: Once | INTRAVENOUS | Status: AC
  Administered 2021-05-18: 20 mg via INTRAVENOUS
  Filled 2021-05-18: qty 20

## 2021-05-18 MED ORDER — MAGNESIUM SULFATE 2 GM/50ML IV SOLN
2.0000 g | Freq: Once | INTRAVENOUS | Status: AC
Start: 2021-05-18 — End: 2021-05-18
  Administered 2021-05-18: 2 g via INTRAVENOUS

## 2021-05-18 MED ORDER — DIPHENHYDRAMINE HCL 50 MG/ML IJ SOLN
50.0000 mg | Freq: Once | INTRAMUSCULAR | Status: AC
Start: 2021-05-18 — End: 2021-05-18
  Administered 2021-05-18: 50 mg via INTRAVENOUS
  Filled 2021-05-18: qty 1

## 2021-05-18 MED ORDER — FLUOROURACIL CHEMO INJECTION 2.5 GM/50ML
400.0000 mg/m2 | Freq: Once | INTRAVENOUS | Status: AC
  Administered 2021-05-18: 850 mg via INTRAVENOUS
  Filled 2021-05-18: qty 17

## 2021-05-18 MED ORDER — POTASSIUM CHLORIDE CRYS ER 20 MEQ PO TBCR
20.0000 meq | EXTENDED_RELEASE_TABLET | Freq: Once | ORAL | Status: AC
  Administered 2021-05-18: 20 meq via ORAL
  Filled 2021-05-18: qty 1

## 2021-05-18 MED ORDER — POTASSIUM CHLORIDE 20 MEQ PO PACK: 20.0000 meq | PACK | Freq: Once | ORAL | Status: DC

## 2021-05-18 MED ORDER — LEVETIRACETAM 100 MG/ML PO SOLN: ORAL | 11 refills | Status: DC

## 2021-05-18 MED ORDER — HEPARIN SOD (PORK) LOCK FLUSH 100 UNIT/ML IV SOLN: 500.0000 [IU] | Freq: Once | INTRAVENOUS | Status: DC | PRN

## 2021-05-18 MED ORDER — SODIUM CHLORIDE 0.9% FLUSH
10.0000 mL | INTRAVENOUS | Status: DC | PRN
  Administered 2021-05-18: 10 mL

## 2021-05-18 MED ORDER — SODIUM CHLORIDE 0.9 % IV SOLN
10.0000 mg | Freq: Once | INTRAVENOUS | Status: AC
  Administered 2021-05-18: 10 mg via INTRAVENOUS
  Filled 2021-05-18: qty 10

## 2021-05-18 NOTE — Progress Notes (Signed)
Labs drawn and Port flushed with good blood return noted. No bruising or swelling at site. Awaiting labs for treatment.

## 2021-05-18 NOTE — Patient Instructions (Addendum)
[facility]  Discharge Instructions: Thank you for choosing [facility] to provide your oncology and hematology care.  If you have a lab appointment with the [facility], please come in thru the Main Entrance and check in at the main information desk.  Wear comfortable clothing and clothing appropriate for easy access to any Portacath or PICC line.   We strive to give you quality time with your provider. You may need to reschedule your appointment if you arrive late (15 or more minutes).  Arriving late affects you and other patients whose appointments are after yours.  Also, if you miss three or more appointments without notifying the office, you may be dismissed from the clinic at the provider's discretion.      For prescription refill requests, have your pharmacy contact our office and allow 72 hours for refills to be completed.    Today you received the following chemotherapy and/or immunotherapy agents FOLFOX, and 5FU pump connected. Per Dr. Delton Coombes take 2mg  of imodium in the mornings. Return as scheduled.   To help prevent nausea and vomiting after your treatment, we encourage you to take your nausea medication as directed.  BELOW ARE SYMPTOMS THAT SHOULD BE REPORTED IMMEDIATELY: *FEVER GREATER THAN 100.4 F (38 C) OR HIGHER *CHILLS OR SWEATING *NAUSEA AND VOMITING THAT IS NOT CONTROLLED WITH YOUR NAUSEA MEDICATION *UNUSUAL SHORTNESS OF BREATH *UNUSUAL BRUISING OR BLEEDING *URINARY PROBLEMS (pain or burning when urinating, or frequent urination) *BOWEL PROBLEMS (unusual diarrhea, constipation, pain near the anus) TENDERNESS IN MOUTH AND THROAT WITH OR WITHOUT PRESENCE OF ULCERS (sore throat, sores in mouth, or a toothache) UNUSUAL RASH, SWELLING OR PAIN  UNUSUAL VAGINAL DISCHARGE OR ITCHING   Items with * indicate a potential emergency and should be followed up as soon as possible or go to the Emergency Department if any problems should occur.  Please  show the CHEMOTHERAPY ALERT CARD or IMMUNOTHERAPY ALERT CARD at check-in to the Emergency Department and triage nurse.  Should you have questions after your visit or need to cancel or reschedule your appointment, please contact [facility] [phone]  and follow the prompts.  Office hours are 8:00 a.m. to 4:30 p.m. Monday - Friday. Please note that voicemails left after 4:00 p.m. may not be returned until the following business day.  We are closed weekends and major holidays. You have access to a nurse at all times for urgent questions. Please call the main number to the clinic [phone] and follow the prompts.  For any non-urgent questions, you may also contact your provider using MyChart. We now offer e-Visits for anyone 59 and older to request care online for non-urgent symptoms. For details visit [email or web address].   Also download the MyChart app! Go to the app store, search "MyChart", open the app, select [facility], and log in with your MyChart username and password.  Due to Covid, a mask is required upon entering the hospital/clinic. If you do not have a mask, one will be given to you upon arrival. For doctor visits, patients may have 1 support person aged 44 or older with them. For treatment visits, patients cannot have anyone with them due to current Covid guidelines and our immunocompromised population.

## 2021-05-18 NOTE — Telephone Encounter (Signed)
Pt called no answer left a voice mail for pt to call back

## 2021-05-18 NOTE — Telephone Encounter (Signed)
Pls let him know I sent in the Rx for liquid Keppra: he should take 70mL twice a day. Thanks

## 2021-05-18 NOTE — Progress Notes (Signed)
Patient has been assessed, vital signs and labs have been reviewed by Dr. Delton Coombes. ANC, Creatinine, LFTs, and Platelets are within treatment parameters per Dr. Delton Coombes. The patient is good to proceed with treatment at this time.  Mag 2 gm IV and KCL 20 meq PO with treatment today. Primary RN and pharmacy aware.

## 2021-05-18 NOTE — Progress Notes (Signed)
Patient presents today for FOLFOX.Potassium 3.1 and Magnesium 1.6. Labs and patient assessed by Dr. Delton Coombes, orders received to give 2g of Magnesium Sulfate and 20 mEq Potassium chloride tablet. Patient given Magnesium sulfate and potassium chloride before treatment, patient tolerated medications with no complaints voiced. Okay to proceed with treatment per Dr. Delton Coombes.  Patient tolerated chemotherapy with no complaints voiced. Side effects with management reviewed understanding verbalized. Port site clean and dry with no bruising or swelling noted at site. Good blood return noted before and after administration of chemotherapy.5FU pump connected with no alarms noted. Patient left in satisfactory condition with VSS and no s/s of distress noted.

## 2021-05-18 NOTE — Patient Instructions (Addendum)
[facility] at [facility] Discharge Instructions  You were seen today by Dr. Delton Coombes. He went over your recent results, and you received your treatment. Take 1 tablet of Imodium in the morning to help with your diarrhea. Dr. Delton Coombes will see you back in 2 weeks for labs and follow up.   Thank you for choosing [facility] at [facility] to provide your oncology and hematology care.  To afford each patient quality time with our provider, please arrive at least 15 minutes before your scheduled appointment time.   If you have a lab appointment with the [facility] please come in thru the Main Entrance and check in at the main information desk  You need to re-schedule your appointment should you arrive 10 or more minutes late.  We strive to give you quality time with our providers, and arriving late affects you and other patients whose appointments are after yours.  Also, if you no show three or more times for appointments you may be dismissed from the clinic at the providers discretion.     Again, thank you for choosing [facility].  Our hope is that these requests will decrease the amount of time that you wait before being seen by our physicians.       _____________________________________________________________  Should you have questions after your visit to [facility], please contact our office at [phone] between the hours of 8:00 a.m. and 4:30 p.m.  Voicemails left after 4:00 p.m. will not be returned until the following business day.  For prescription refill requests, have your pharmacy contact our office and allow 72 hours.    Cancer Center Support Programs:   > Cancer Support Group  2nd Tuesday of the month 1pm-2pm, Journey Room

## 2021-05-19 NOTE — Telephone Encounter (Signed)
Pt called and informed that Rx for liquid Keppra: he should take 78mL twice a day was sent to pharmacy

## 2021-05-20 ENCOUNTER — Other Ambulatory Visit: Payer: Self-pay

## 2021-05-20 ENCOUNTER — Inpatient Hospital Stay (HOSPITAL_COMMUNITY): Payer: BLUE CROSS/BLUE SHIELD

## 2021-05-20 VITALS — BP 107/74 | HR 97 | Temp 98.1°F | Resp 18

## 2021-05-20 DIAGNOSIS — Z8249 Family history of ischemic heart disease and other diseases of the circulatory system: Secondary | ICD-10-CM | POA: Diagnosis not present

## 2021-05-20 DIAGNOSIS — G479 Sleep disorder, unspecified: Secondary | ICD-10-CM | POA: Diagnosis not present

## 2021-05-20 DIAGNOSIS — R200 Anesthesia of skin: Secondary | ICD-10-CM | POA: Diagnosis not present

## 2021-05-20 DIAGNOSIS — R197 Diarrhea, unspecified: Secondary | ICD-10-CM | POA: Diagnosis not present

## 2021-05-20 DIAGNOSIS — R5383 Other fatigue: Secondary | ICD-10-CM | POA: Diagnosis not present

## 2021-05-20 DIAGNOSIS — E785 Hyperlipidemia, unspecified: Secondary | ICD-10-CM | POA: Diagnosis not present

## 2021-05-20 DIAGNOSIS — Z95828 Presence of other vascular implants and grafts: Secondary | ICD-10-CM

## 2021-05-20 DIAGNOSIS — E876 Hypokalemia: Secondary | ICD-10-CM | POA: Diagnosis not present

## 2021-05-20 DIAGNOSIS — C182 Malignant neoplasm of ascending colon: Secondary | ICD-10-CM | POA: Diagnosis not present

## 2021-05-20 DIAGNOSIS — Z5189 Encounter for other specified aftercare: Secondary | ICD-10-CM | POA: Diagnosis not present

## 2021-05-20 DIAGNOSIS — R599 Enlarged lymph nodes, unspecified: Secondary | ICD-10-CM | POA: Diagnosis not present

## 2021-05-20 DIAGNOSIS — Z5111 Encounter for antineoplastic chemotherapy: Secondary | ICD-10-CM | POA: Diagnosis not present

## 2021-05-20 DIAGNOSIS — Z8261 Family history of arthritis: Secondary | ICD-10-CM | POA: Diagnosis not present

## 2021-05-20 DIAGNOSIS — Z79899 Other long term (current) drug therapy: Secondary | ICD-10-CM | POA: Diagnosis not present

## 2021-05-20 DIAGNOSIS — Z832 Family history of diseases of the blood and blood-forming organs and certain disorders involving the immune mechanism: Secondary | ICD-10-CM | POA: Diagnosis not present

## 2021-05-20 DIAGNOSIS — N400 Enlarged prostate without lower urinary tract symptoms: Secondary | ICD-10-CM | POA: Diagnosis not present

## 2021-05-20 LAB — CEA: CEA: 4.9 ng/mL — ABNORMAL HIGH (ref 0.0–4.7)

## 2021-05-20 MED ORDER — SODIUM CHLORIDE 0.9% FLUSH
10.0000 mL | INTRAVENOUS | Status: DC | PRN
  Administered 2021-05-20: 10 mL

## 2021-05-20 MED ORDER — PEGFILGRASTIM-CBQV 6 MG/0.6ML ~~LOC~~ SOSY
6.0000 mg | PREFILLED_SYRINGE | Freq: Once | SUBCUTANEOUS | Status: AC
  Administered 2021-05-20: 6 mg via SUBCUTANEOUS
  Filled 2021-05-20: qty 0.6

## 2021-05-20 MED ORDER — HEPARIN SOD (PORK) LOCK FLUSH 100 UNIT/ML IV SOLN
500.0000 [IU] | Freq: Once | INTRAVENOUS | Status: AC | PRN
  Administered 2021-05-20: 500 [IU]

## 2021-05-20 NOTE — Progress Notes (Signed)
Patient here today for pump d/c.  Patient had 12 mL of chemotherapy left to infuse.  MD notified.  Dr. Delton Coombes advised to bolus the remaining chemotherapy.  At the time of bolus, there was 8.3 mL left to infuse.  Patient tolerated bolus well.  Patient left ambulatory in satisfactory condition.  Vital signs stable.

## 2021-05-20 NOTE — Patient Instructions (Signed)
[facility]  Discharge Instructions: Thank you for choosing [facility] to provide your oncology and hematology care.  If you have a lab appointment with the Cancer Center, please come in thru the Main Entrance and check in at the main information desk.  Wear comfortable clothing and clothing appropriate for easy access to any Portacath or PICC line.   We strive to give you quality time with your provider. You may need to reschedule your appointment if you arrive late (15 or more minutes).  Arriving late affects you and other patients whose appointments are after yours.  Also, if you miss three or more appointments without notifying the office, you may be dismissed from the clinic at the provider's discretion.      For prescription refill requests, have your pharmacy contact our office and allow 72 hours for refills to be completed.        To help prevent nausea and vomiting after your treatment, we encourage you to take your nausea medication as directed.  BELOW ARE SYMPTOMS THAT SHOULD BE REPORTED IMMEDIATELY: *FEVER GREATER THAN 100.4 F (38 C) OR HIGHER *CHILLS OR SWEATING *NAUSEA AND VOMITING THAT IS NOT CONTROLLED WITH YOUR NAUSEA MEDICATION *UNUSUAL SHORTNESS OF BREATH *UNUSUAL BRUISING OR BLEEDING *URINARY PROBLEMS (pain or burning when urinating, or frequent urination) *BOWEL PROBLEMS (unusual diarrhea, constipation, pain near the anus) TENDERNESS IN MOUTH AND THROAT WITH OR WITHOUT PRESENCE OF ULCERS (sore throat, sores in mouth, or a toothache) UNUSUAL RASH, SWELLING OR PAIN  UNUSUAL VAGINAL DISCHARGE OR ITCHING   Items with * indicate a potential emergency and should be followed up as soon as possible or go to the Emergency Department if any problems should occur.  Please show the CHEMOTHERAPY ALERT CARD or IMMUNOTHERAPY ALERT CARD at check-in to the Emergency Department and triage nurse.  Should you have questions after your visit or need to cancel  or reschedule your appointment, please contact [facility] [phone]  and follow the prompts.  Office hours are 8:00 a.m. to 4:30 p.m. Monday - Friday. Please note that voicemails left after 4:00 p.m. may not be returned until the following business day.  We are closed weekends and major holidays. You have access to a nurse at all times for urgent questions. Please call the main number to the clinic [phone] and follow the prompts.  For any non-urgent questions, you may also contact your provider using MyChart. We now offer e-Visits for anyone 18 and older to request care online for non-urgent symptoms. For details visit [email or web address].   Also download the MyChart app! Go to the app store, search "MyChart", open the app, select [facility], and log in with your MyChart username and password.  Due to Covid, a mask is required upon entering the hospital/clinic. If you do not have a mask, one will be given to you upon arrival. For doctor visits, patients may have 1 support person aged 18 or older with them. For treatment visits, patients cannot have anyone with them due to current Covid guidelines and our immunocompromised population.  

## 2021-05-27 DIAGNOSIS — C182 Malignant neoplasm of ascending colon: Secondary | ICD-10-CM | POA: Diagnosis not present
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
## 2021-06-01 ENCOUNTER — Inpatient Hospital Stay (HOSPITAL_COMMUNITY): Payer: BLUE CROSS/BLUE SHIELD

## 2021-06-01 ENCOUNTER — Other Ambulatory Visit: Payer: Self-pay

## 2021-06-01 ENCOUNTER — Inpatient Hospital Stay (HOSPITAL_BASED_OUTPATIENT_CLINIC_OR_DEPARTMENT_OTHER): Payer: BLUE CROSS/BLUE SHIELD | Admitting: Hematology

## 2021-06-01 ENCOUNTER — Inpatient Hospital Stay (HOSPITAL_COMMUNITY): Payer: BLUE CROSS/BLUE SHIELD | Attending: Hematology

## 2021-06-01 ENCOUNTER — Other Ambulatory Visit (HOSPITAL_COMMUNITY): Payer: Self-pay | Admitting: *Deleted

## 2021-06-01 VITALS — BP 150/87 | HR 86 | Temp 96.8°F | Resp 18 | Wt 215.2 lb

## 2021-06-01 VITALS — BP 118/74 | HR 75 | Temp 97.0°F | Resp 18

## 2021-06-01 DIAGNOSIS — R748 Abnormal levels of other serum enzymes: Secondary | ICD-10-CM | POA: Insufficient documentation

## 2021-06-01 DIAGNOSIS — Z882 Allergy status to sulfonamides status: Secondary | ICD-10-CM | POA: Insufficient documentation

## 2021-06-01 DIAGNOSIS — Z9049 Acquired absence of other specified parts of digestive tract: Secondary | ICD-10-CM | POA: Diagnosis not present

## 2021-06-01 DIAGNOSIS — R519 Headache, unspecified: Secondary | ICD-10-CM | POA: Insufficient documentation

## 2021-06-01 DIAGNOSIS — Z888 Allergy status to other drugs, medicaments and biological substances status: Secondary | ICD-10-CM | POA: Diagnosis not present

## 2021-06-01 DIAGNOSIS — Z79899 Other long term (current) drug therapy: Secondary | ICD-10-CM | POA: Insufficient documentation

## 2021-06-01 DIAGNOSIS — R197 Diarrhea, unspecified: Secondary | ICD-10-CM | POA: Diagnosis not present

## 2021-06-01 DIAGNOSIS — E785 Hyperlipidemia, unspecified: Secondary | ICD-10-CM | POA: Diagnosis not present

## 2021-06-01 DIAGNOSIS — Z832 Family history of diseases of the blood and blood-forming organs and certain disorders involving the immune mechanism: Secondary | ICD-10-CM | POA: Insufficient documentation

## 2021-06-01 DIAGNOSIS — C182 Malignant neoplasm of ascending colon: Secondary | ICD-10-CM

## 2021-06-01 DIAGNOSIS — Z5111 Encounter for antineoplastic chemotherapy: Secondary | ICD-10-CM | POA: Diagnosis not present

## 2021-06-01 DIAGNOSIS — Z8269 Family history of other diseases of the musculoskeletal system and connective tissue: Secondary | ICD-10-CM | POA: Insufficient documentation

## 2021-06-01 DIAGNOSIS — Z8261 Family history of arthritis: Secondary | ICD-10-CM | POA: Diagnosis not present

## 2021-06-01 DIAGNOSIS — R0602 Shortness of breath: Secondary | ICD-10-CM | POA: Insufficient documentation

## 2021-06-01 DIAGNOSIS — D696 Thrombocytopenia, unspecified: Secondary | ICD-10-CM | POA: Diagnosis not present

## 2021-06-01 DIAGNOSIS — Z5189 Encounter for other specified aftercare: Secondary | ICD-10-CM | POA: Insufficient documentation

## 2021-06-01 DIAGNOSIS — R599 Enlarged lymph nodes, unspecified: Secondary | ICD-10-CM | POA: Diagnosis not present

## 2021-06-01 DIAGNOSIS — Z95828 Presence of other vascular implants and grafts: Secondary | ICD-10-CM

## 2021-06-01 DIAGNOSIS — I10 Essential (primary) hypertension: Secondary | ICD-10-CM | POA: Insufficient documentation

## 2021-06-01 DIAGNOSIS — Z880 Allergy status to penicillin: Secondary | ICD-10-CM | POA: Insufficient documentation

## 2021-06-01 DIAGNOSIS — E876 Hypokalemia: Secondary | ICD-10-CM | POA: Insufficient documentation

## 2021-06-01 DIAGNOSIS — R5383 Other fatigue: Secondary | ICD-10-CM | POA: Insufficient documentation

## 2021-06-01 DIAGNOSIS — R918 Other nonspecific abnormal finding of lung field: Secondary | ICD-10-CM | POA: Insufficient documentation

## 2021-06-01 DIAGNOSIS — G479 Sleep disorder, unspecified: Secondary | ICD-10-CM | POA: Diagnosis not present

## 2021-06-01 DIAGNOSIS — Z8249 Family history of ischemic heart disease and other diseases of the circulatory system: Secondary | ICD-10-CM | POA: Insufficient documentation

## 2021-06-01 DIAGNOSIS — G629 Polyneuropathy, unspecified: Secondary | ICD-10-CM | POA: Diagnosis not present

## 2021-06-01 DIAGNOSIS — R200 Anesthesia of skin: Secondary | ICD-10-CM | POA: Diagnosis not present

## 2021-06-01 LAB — COMPREHENSIVE METABOLIC PANEL
ALT: 32 U/L (ref 0–44)
AST: 37 U/L (ref 15–41)
Albumin: 3.5 g/dL (ref 3.5–5.0)
Alkaline Phosphatase: 174 U/L — ABNORMAL HIGH (ref 38–126)
Anion gap: 6 (ref 5–15)
BUN: 8 mg/dL (ref 6–20)
CO2: 26 mmol/L (ref 22–32)
Calcium: 8.6 mg/dL — ABNORMAL LOW (ref 8.9–10.3)
Chloride: 105 mmol/L (ref 98–111)
Creatinine, Ser: 0.83 mg/dL (ref 0.61–1.24)
GFR, Estimated: >60 mL/min (ref >60)
Glucose, Bld: 135 mg/dL — ABNORMAL HIGH (ref 70–99)
Potassium: 3.1 mmol/L — ABNORMAL LOW (ref 3.5–5.1)
Sodium: 137 mmol/L (ref 135–145)
Total Bilirubin: 0.9 mg/dL (ref 0.3–1.2)
Total Protein: 6.3 g/dL — ABNORMAL LOW (ref 6.5–8.1)

## 2021-06-01 LAB — CBC WITH DIFFERENTIAL/PLATELET
Abs Immature Granulocytes: 0.04 10*3/uL (ref 0.00–0.07)
Basophils Absolute: 0 10*3/uL (ref 0.0–0.1)
Basophils Relative: 1 %
Eosinophils Absolute: 0.1 10*3/uL (ref 0.0–0.5)
Eosinophils Relative: 3 %
HCT: 38.8 % — ABNORMAL LOW (ref 39.0–52.0)
Hemoglobin: 12.9 g/dL — ABNORMAL LOW (ref 13.0–17.0)
Immature Granulocytes: 1 %
Lymphocytes Relative: 34 %
Lymphs Abs: 1 10*3/uL (ref 0.7–4.0)
MCH: 34.8 pg — ABNORMAL HIGH (ref 26.0–34.0)
MCHC: 33.2 g/dL (ref 30.0–36.0)
MCV: 104.6 fL — ABNORMAL HIGH (ref 80.0–100.0)
Monocytes Absolute: 0.4 10*3/uL (ref 0.1–1.0)
Monocytes Relative: 15 %
Neutro Abs: 1.3 10*3/uL — ABNORMAL LOW (ref 1.7–7.7)
Neutrophils Relative %: 46 %
Platelets: 130 10*3/uL — ABNORMAL LOW (ref 150–400)
RBC: 3.71 MIL/uL — ABNORMAL LOW (ref 4.22–5.81)
RDW: 16.2 % — ABNORMAL HIGH (ref 11.5–15.5)
WBC: 2.8 10*3/uL — ABNORMAL LOW (ref 4.0–10.5)
nRBC: 0 % (ref 0.0–0.2)

## 2021-06-01 LAB — MAGNESIUM: Magnesium: 1.8 mg/dL (ref 1.7–2.4)

## 2021-06-01 MED ORDER — POTASSIUM CHLORIDE 20 MEQ/15ML (10%) PO SOLN: 20.0000 meq | Freq: Every day | ORAL | 0 refills | Status: DC

## 2021-06-01 MED ORDER — FLUOROURACIL CHEMO INJECTION 2.5 GM/50ML
400.0000 mg/m2 | Freq: Once | INTRAVENOUS | Status: AC
  Administered 2021-06-01: 850 mg via INTRAVENOUS
  Filled 2021-06-01: qty 17

## 2021-06-01 MED ORDER — DIPHENHYDRAMINE HCL 50 MG/ML IJ SOLN
50.0000 mg | Freq: Once | INTRAMUSCULAR | Status: AC
  Administered 2021-06-01: 50 mg via INTRAVENOUS
  Filled 2021-06-01: qty 1

## 2021-06-01 MED ORDER — SODIUM CHLORIDE 0.9 % IV SOLN
2300.0000 mg/m2 | INTRAVENOUS | Status: DC
  Administered 2021-06-01: 5000 mg via INTRAVENOUS
  Filled 2021-06-01: qty 100

## 2021-06-01 MED ORDER — SODIUM CHLORIDE 0.9 % IV SOLN
400.0000 mg/m2 | Freq: Once | INTRAVENOUS | Status: AC
  Administered 2021-06-01: 872 mg via INTRAVENOUS
  Filled 2021-06-01: qty 43.6

## 2021-06-01 MED ORDER — FAMOTIDINE 20 MG IN NS 100 ML IVPB
20.0000 mg | Freq: Once | INTRAVENOUS | Status: AC
  Administered 2021-06-01: 20 mg via INTRAVENOUS
  Filled 2021-06-01: qty 20

## 2021-06-01 MED ORDER — SODIUM CHLORIDE 0.9% FLUSH
10.0000 mL | INTRAVENOUS | Status: DC | PRN
  Administered 2021-06-01: 10 mL

## 2021-06-01 MED ORDER — DEXTROSE 5 % IV SOLN: Freq: Once | INTRAVENOUS | Status: AC

## 2021-06-01 MED ORDER — SODIUM CHLORIDE 0.9 % IV SOLN
10.0000 mg | Freq: Once | INTRAVENOUS | Status: AC
  Administered 2021-06-01: 10 mg via INTRAVENOUS
  Filled 2021-06-01: qty 10

## 2021-06-01 MED ORDER — PALONOSETRON HCL INJECTION 0.25 MG/5ML
0.2500 mg | Freq: Once | INTRAVENOUS | Status: AC
  Administered 2021-06-01: 0.25 mg via INTRAVENOUS
  Filled 2021-06-01: qty 5

## 2021-06-01 NOTE — Progress Notes (Signed)
Patient presents today for treatment, okay for treatment per Dr. Delton Coombes, holding Oxaliplatin for today's treatment.  Patient tolerated chemotherapy with no complaints voiced. Side effects with management reviewed understanding verbalized. Port site clean and dry with no bruising or swelling noted at site. Good blood return noted before and after administration of chemotherapy. Chemo pump connected with no alarms noted. Patient left ambulatory in satisfactory condition with VSS and no s/s of distress noted.

## 2021-06-01 NOTE — Progress Notes (Signed)
Patient has been assessed, vital signs and labs have been reviewed by Dr. Katragadda. ANC, Creatinine, LFTs, and Platelets are within treatment parameters per Dr. Katragadda. The patient is good to proceed with treatment at this time. Primary RN and pharmacy aware.  

## 2021-06-01 NOTE — Patient Instructions (Addendum)
[facility] Discharge Instructions  You were seen today by Dr. Katragadda. He went over your recent results, and you received your treatment. Dr. Katragadda will see you back in 2 weeks for labs and follow up.   Thank you for choosing [facility] to provide your oncology and hematology care.  To afford each patient quality time with our provider, please arrive at least 15 minutes before your scheduled appointment time.   If you have a lab appointment with the Cancer Center please come in thru the Main Entrance and check in at the main information desk  You need to re-schedule your appointment should you arrive 10 or more minutes late.  We strive to give you quality time with our providers, and arriving late affects you and other patients whose appointments are after yours.  Also, if you no show three or more times for appointments you may be dismissed from the clinic at the providers discretion.     Again, thank you for choosing [facility].  Our hope is that these requests will decrease the amount of time that you wait before being seen by our physicians.       _____________________________________________________________  Should you have questions after your visit to [facility], please contact our office at [phone] between the hours of 8:00 a.m. and 4:30 p.m.  Voicemails left after 4:00 p.m. will not be returned until the following business day.  For prescription refill requests, have your pharmacy contact our office and allow 72 hours.    Cancer Center Support Programs:   > Cancer Support Group  2nd Tuesday of the month 1pm-2pm, Journey Room   

## 2021-06-01 NOTE — Patient Instructions (Signed)
[facility]  Discharge Instructions: Thank you for choosing [facility] to provide your oncology and hematology care.  If you have a lab appointment with the [facility], please come in thru the Main Entrance and check in at the main information desk.  Wear comfortable clothing and clothing appropriate for easy access to any Portacath or PICC line.   We strive to give you quality time with your provider. You may need to reschedule your appointment if you arrive late (15 or more minutes).  Arriving late affects you and other patients whose appointments are after yours.  Also, if you miss three or more appointments without notifying the office, you may be dismissed from the clinic at the provider's discretion.      For prescription refill requests, have your pharmacy contact our office and allow 72 hours for refills to be completed.    Today you received the following chemotherapy and/or immunotherapy agents: Leucovorin and 5FU pump connected. Return as scheduled.   To help prevent nausea and vomiting after your treatment, we encourage you to take your nausea medication as directed.  BELOW ARE SYMPTOMS THAT SHOULD BE REPORTED IMMEDIATELY: *FEVER GREATER THAN 100.4 F (38 C) OR HIGHER *CHILLS OR SWEATING *NAUSEA AND VOMITING THAT IS NOT CONTROLLED WITH YOUR NAUSEA MEDICATION *UNUSUAL SHORTNESS OF BREATH *UNUSUAL BRUISING OR BLEEDING *URINARY PROBLEMS (pain or burning when urinating, or frequent urination) *BOWEL PROBLEMS (unusual diarrhea, constipation, pain near the anus) TENDERNESS IN MOUTH AND THROAT WITH OR WITHOUT PRESENCE OF ULCERS (sore throat, sores in mouth, or a toothache) UNUSUAL RASH, SWELLING OR PAIN  UNUSUAL VAGINAL DISCHARGE OR ITCHING   Items with * indicate a potential emergency and should be followed up as soon as possible or go to the Emergency Department if any problems should occur.  Please show the CHEMOTHERAPY ALERT CARD or IMMUNOTHERAPY  ALERT CARD at check-in to the Emergency Department and triage nurse.  Should you have questions after your visit or need to cancel or reschedule your appointment, please contact [facility] [phone]  and follow the prompts.  Office hours are 8:00 a.m. to 4:30 p.m. Monday - Friday. Please note that voicemails left after 4:00 p.m. may not be returned until the following business day.  We are closed weekends and major holidays. You have access to a nurse at all times for urgent questions. Please call the main number to the clinic [phone] and follow the prompts.  For any non-urgent questions, you may also contact your provider using MyChart. We now offer e-Visits for anyone 33 and older to request care online for non-urgent symptoms. For details visit [email or web address].   Also download the MyChart app! Go to the app store, search "MyChart", open the app, select [facility], and log in with your MyChart username and password.  Due to Covid, a mask is required upon entering the hospital/clinic. If you do not have a mask, one will be given to you upon arrival. For doctor visits, patients may have 1 support person aged 45 or older with them. For treatment visits, patients cannot have anyone with them due to current Covid guidelines and our immunocompromised population.

## 2021-06-02 LAB — CEA: CEA: 4.4 ng/mL (ref 0.0–4.7)

## 2021-06-03 ENCOUNTER — Telehealth: Payer: Self-pay | Admitting: Neurology

## 2021-06-03 ENCOUNTER — Other Ambulatory Visit: Payer: Self-pay

## 2021-06-03 ENCOUNTER — Inpatient Hospital Stay (HOSPITAL_COMMUNITY): Payer: BLUE CROSS/BLUE SHIELD

## 2021-06-03 VITALS — BP 117/74 | HR 85 | Temp 96.9°F | Resp 18

## 2021-06-03 DIAGNOSIS — R918 Other nonspecific abnormal finding of lung field: Secondary | ICD-10-CM | POA: Diagnosis not present

## 2021-06-03 DIAGNOSIS — R5383 Other fatigue: Secondary | ICD-10-CM | POA: Diagnosis not present

## 2021-06-03 DIAGNOSIS — R599 Enlarged lymph nodes, unspecified: Secondary | ICD-10-CM | POA: Diagnosis not present

## 2021-06-03 DIAGNOSIS — R197 Diarrhea, unspecified: Secondary | ICD-10-CM | POA: Diagnosis not present

## 2021-06-03 DIAGNOSIS — C182 Malignant neoplasm of ascending colon: Secondary | ICD-10-CM | POA: Diagnosis not present

## 2021-06-03 DIAGNOSIS — Z5111 Encounter for antineoplastic chemotherapy: Secondary | ICD-10-CM | POA: Diagnosis not present

## 2021-06-03 DIAGNOSIS — R0602 Shortness of breath: Secondary | ICD-10-CM | POA: Diagnosis not present

## 2021-06-03 DIAGNOSIS — Z5189 Encounter for other specified aftercare: Secondary | ICD-10-CM | POA: Diagnosis not present

## 2021-06-03 DIAGNOSIS — E876 Hypokalemia: Secondary | ICD-10-CM | POA: Diagnosis not present

## 2021-06-03 DIAGNOSIS — G629 Polyneuropathy, unspecified: Secondary | ICD-10-CM | POA: Diagnosis not present

## 2021-06-03 DIAGNOSIS — D696 Thrombocytopenia, unspecified: Secondary | ICD-10-CM | POA: Diagnosis not present

## 2021-06-03 DIAGNOSIS — R200 Anesthesia of skin: Secondary | ICD-10-CM | POA: Diagnosis not present

## 2021-06-03 DIAGNOSIS — R748 Abnormal levels of other serum enzymes: Secondary | ICD-10-CM | POA: Diagnosis not present

## 2021-06-03 DIAGNOSIS — R519 Headache, unspecified: Secondary | ICD-10-CM | POA: Diagnosis not present

## 2021-06-03 DIAGNOSIS — Z95828 Presence of other vascular implants and grafts: Secondary | ICD-10-CM

## 2021-06-03 DIAGNOSIS — G479 Sleep disorder, unspecified: Secondary | ICD-10-CM | POA: Diagnosis not present

## 2021-06-03 MED ORDER — PEGFILGRASTIM-CBQV 6 MG/0.6ML ~~LOC~~ SOSY
PREFILLED_SYRINGE | SUBCUTANEOUS | Status: AC
  Filled 2021-06-03: qty 0.6

## 2021-06-03 MED ORDER — OXTELLAR XR 300 MG PO TB24: ORAL_TABLET | ORAL | 6 refills | Status: DC

## 2021-06-03 MED ORDER — HEPARIN SOD (PORK) LOCK FLUSH 100 UNIT/ML IV SOLN
500.0000 [IU] | Freq: Once | INTRAVENOUS | Status: AC | PRN
Start: 2021-06-03 — End: 2021-06-03
  Administered 2021-06-03: 500 [IU]

## 2021-06-03 MED ORDER — SODIUM CHLORIDE 0.9% FLUSH
10.0000 mL | INTRAVENOUS | Status: DC | PRN
  Administered 2021-06-03: 10 mL

## 2021-06-03 MED ORDER — PEGFILGRASTIM-CBQV 6 MG/0.6ML ~~LOC~~ SOSY
6.0000 mg | PREFILLED_SYRINGE | Freq: Once | SUBCUTANEOUS | Status: AC
  Administered 2021-06-03: 6 mg via SUBCUTANEOUS

## 2021-06-03 NOTE — Patient Instructions (Signed)
[facility]  Discharge Instructions: Thank you for choosing [facility] to provide your oncology and hematology care.  If you have a lab appointment with the Cancer Center, please come in thru the Main Entrance and check in at the main information desk.  Wear comfortable clothing and clothing appropriate for easy access to any Portacath or PICC line.   We strive to give you quality time with your provider. You may need to reschedule your appointment if you arrive late (15 or more minutes).  Arriving late affects you and other patients whose appointments are after yours.  Also, if you miss three or more appointments without notifying the office, you may be dismissed from the clinic at the provider's discretion.      For prescription refill requests, have your pharmacy contact our office and allow 72 hours for refills to be completed.        To help prevent nausea and vomiting after your treatment, we encourage you to take your nausea medication as directed.  BELOW ARE SYMPTOMS THAT SHOULD BE REPORTED IMMEDIATELY: *FEVER GREATER THAN 100.4 F (38 C) OR HIGHER *CHILLS OR SWEATING *NAUSEA AND VOMITING THAT IS NOT CONTROLLED WITH YOUR NAUSEA MEDICATION *UNUSUAL SHORTNESS OF BREATH *UNUSUAL BRUISING OR BLEEDING *URINARY PROBLEMS (pain or burning when urinating, or frequent urination) *BOWEL PROBLEMS (unusual diarrhea, constipation, pain near the anus) TENDERNESS IN MOUTH AND THROAT WITH OR WITHOUT PRESENCE OF ULCERS (sore throat, sores in mouth, or a toothache) UNUSUAL RASH, SWELLING OR PAIN  UNUSUAL VAGINAL DISCHARGE OR ITCHING   Items with * indicate a potential emergency and should be followed up as soon as possible or go to the Emergency Department if any problems should occur.  Please show the CHEMOTHERAPY ALERT CARD or IMMUNOTHERAPY ALERT CARD at check-in to the Emergency Department and triage nurse.  Should you have questions after your visit or need to cancel  or reschedule your appointment, please contact  [facility] [phone]  and follow the prompts.  Office hours are 8:00 a.m. to 4:30 p.m. Monday - Friday. Please note that voicemails left after 4:00 p.m. may not be returned until the following business day.  We are closed weekends and major holidays. You have access to a nurse at all times for urgent questions. Please call the main number to the clinic [phone] and follow the prompts.  For any non-urgent questions, you may also contact your provider using MyChart. We now offer e-Visits for anyone 18 and older to request care online for non-urgent symptoms. For details visit [email or web address].   Also download the MyChart app! Go to the app store, search "MyChart", open the app, select [facility], and log in with your MyChart username and password.  Due to Covid, a mask is required upon entering the hospital/clinic. If you do not have a mask, one will be given to you upon arrival. For doctor visits, patients may have 1 support person aged 18 or older with them. For treatment visits, patients cannot have anyone with them due to current Covid guidelines and our immunocompromised population.  

## 2021-06-03 NOTE — Telephone Encounter (Signed)
Pt called in stating he took all of the Oxtellar samples he was given and he is ready for a prescription for them to be sent in to [facility] in [place]. He also stated he mailed in some insurance forms and wants to find out if those had been filled out?

## 2021-06-03 NOTE — Addendum Note (Signed)
Addended by: Cameron Sprang on: 06/03/2021 03:15 PM   Modules accepted: Orders

## 2021-06-03 NOTE — Telephone Encounter (Signed)
Pls confirm no side effects, if none, I will send in Rx for increase dose Oxtellar XR '300mg'$  daily. Form filled out today, thanks

## 2021-06-03 NOTE — Telephone Encounter (Signed)
Form placed in mail

## 2021-06-03 NOTE — Telephone Encounter (Signed)
Rx sent to his local pharmacy

## 2021-06-03 NOTE — Progress Notes (Signed)
Patient presents today for pump d/c per orders.  Patient also received Udencya injection.  Patient tolerated well with no complaints voiced.  Patient left ambulatory in stable condition.  Vital signs stable at discharge.  Follow up as scheduled.

## 2021-06-03 NOTE — Progress Notes (Deleted)
Carlos Miranda presents to have home infusion pump d/c'd and for port-a-cath deaccess with flush.  Portacath located *** chest wall accessed with  {CHL ONC AP PORTACATH NEEDLE:[id number]} needle.  {CHL ONC AP PORTACATH BLOOD HA:5097071 Portacath flushed with NS and 500U/52m Heparin, and needle removed intact.  Procedure tolerated well and without incident.

## 2021-06-06 ENCOUNTER — Ambulatory Visit (INDEPENDENT_AMBULATORY_CARE_PROVIDER_SITE_OTHER): Payer: BLUE CROSS/BLUE SHIELD | Admitting: Family Medicine

## 2021-06-06 ENCOUNTER — Other Ambulatory Visit: Payer: Self-pay

## 2021-06-06 ENCOUNTER — Encounter: Payer: Self-pay | Admitting: Family Medicine

## 2021-06-06 VITALS — BP 111/80 | HR 91 | Ht 68.0 in | Wt 210.0 lb

## 2021-06-06 DIAGNOSIS — I10 Essential (primary) hypertension: Secondary | ICD-10-CM | POA: Diagnosis not present

## 2021-06-06 DIAGNOSIS — E785 Hyperlipidemia, unspecified: Secondary | ICD-10-CM

## 2021-06-06 DIAGNOSIS — Z0001 Encounter for general adult medical examination with abnormal findings: Secondary | ICD-10-CM | POA: Diagnosis not present

## 2021-06-06 DIAGNOSIS — Z23 Encounter for immunization: Secondary | ICD-10-CM

## 2021-06-06 DIAGNOSIS — Z0000 Encounter for general adult medical examination without abnormal findings: Secondary | ICD-10-CM | POA: Diagnosis not present

## 2021-06-06 MED ORDER — PRAVASTATIN SODIUM 40 MG PO TABS: 40.0000 mg | ORAL_TABLET | Freq: Every day | ORAL | 3 refills | Status: DC

## 2021-06-06 MED ORDER — LISINOPRIL 10 MG PO TABS: 10.0000 mg | ORAL_TABLET | Freq: Every day | ORAL | 3 refills | Status: DC

## 2021-06-06 NOTE — Progress Notes (Signed)
BP 111/80   Pulse 91   Ht '5\' 8"'$  (1.727 m)   Wt 210 lb (95.3 kg)   SpO2 96%   BMI 31.93 kg/m    Subjective:   Patient ID: Carlos Miranda, male    DOB: [birth year], 60 y.o.   MRN: [id number]  HPI: Carlos Miranda is a 60 y.o. male presenting on 06/06/2021 for Medical Management of Chronic Issues (CPE)   HPI Physical Patient is currently undergoing treatment for chemotherapy for colon cancer.  He does get some diarrhea related to that and is taking Imodium. Patient denies any chest pain, shortness of breath, headaches or vision issues, abdominal complaints, nausea, vomiting, or joint issues.   Hypertension Patient is currently on lisinopril, and their blood pressure today is 111/80. Patient denies any lightheadedness or dizziness. Patient denies headaches, blurred vision, chest pains, shortness of breath, or weakness. Denies any side effects from medication and is content with current medication.   Hyperlipidemia Patient is coming in for recheck of his hyperlipidemia. The patient is currently taking pravastatin. They deny any issues with myalgias or history of liver damage from it. They deny any focal numbness or weakness or chest pain.   Relevant past medical, surgical, family and social history reviewed and updated as indicated. Interim medical history since our last visit reviewed. Allergies and medications reviewed and updated.  Review of Systems  Constitutional:  Negative for chills and fever.  HENT:  Negative for ear pain and tinnitus.   Eyes:  Negative for pain.  Respiratory:  Negative for cough, shortness of breath and wheezing.   Cardiovascular:  Negative for chest pain, palpitations and leg swelling.  Gastrointestinal:  Positive for diarrhea. Negative for abdominal pain, blood in stool and constipation.  Genitourinary:  Negative for dysuria and hematuria.  Musculoskeletal:  Negative for back pain, gait problem and myalgias.  Skin:  Negative for rash.  Neurological:   Negative for dizziness, weakness, light-headedness and headaches.  Psychiatric/Behavioral:  Negative for suicidal ideas.   All other systems reviewed and are negative.  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 If you have any questions, ask your nurse or doctor.          ferrous sulfate 325 (65 FE) MG tablet Take 325 mg by mouth daily.   fluorouracil CALGB 02725 2,400 mg/m2 in sodium chloride 0.9 % 150 mL Inject 1,920 mg/m2 into the vein over 48 hr. Every 14 days x 12 cycles   FLUOROURACIL IV Inject 320 mg/m2 into the vein every 14 (fourteen) days.   LamoTRIgine 300 MG Tb24 24 hour tablet Take 1 tablet every night   LEUCOVORIN CALCIUM IV Inject into the vein every 14 (fourteen) days.   levETIRAcetam 100 MG/ML solution Commonly known as: KEPPRA Take 17m ('500mg'$ ) twice a day   lidocaine-prilocaine cream Commonly known as: EMLA Apply a small amount to port a cath site and cover with  plastic wrap 1 hour prior to infusion appointments   lisinopril 10 MG tablet Commonly known as: ZESTRIL Take 1 tablet (10 mg total) by mouth daily.   loperamide 2 MG capsule Commonly known as: IMODIUM Take 2 mg by mouth daily. May repeat if needed   oxaliplatin in dextrose 5 % 500 mL Inject into the vein every 14 (fourteen) days.   Oxtellar XR 300 MG Tb24 Generic drug: OXcarbazepine ER Take 1 tablet every night   potassium chloride 20 MEQ/15ML (10%) Soln Take 15 mLs (20 mEq total) by mouth daily.    pravastatin 40 MG tablet Commonly known as: PRAVACHOL Take 1 tablet (40 mg total) by mouth daily.   prochlorperazine 10 MG tablet Commonly known as: COMPAZINE Take 1 tablet (10 mg total) by mouth every 6 (six) hours as needed (Nausea or vomiting).   pseudoephedrine 120 MG 12 hr tablet Commonly known as: SUDAFED Take 120 mg by mouth every 12 (twelve) hours as needed for congestion.         Objective:   BP 111/80   Pulse 91   Ht '5\' 8"'$  (1.727 m)   Wt 210 lb (95.3 kg)   SpO2 96%   BMI 31.93 kg/m   Wt Readings from Last 3 Encounters:  06/06/21 210 lb (95.3 kg)  06/01/21 215 lb 3.2 oz (97.6 kg)  05/18/21 214 lb 9.6 oz (97.3 kg)    Physical Exam Vitals and nursing note reviewed.  Constitutional:      General: He is not in acute distress.    Appearance: He is well-developed. He is not diaphoretic.  Eyes:     General: No scleral icterus.    Conjunctiva/sclera: Conjunctivae normal.  Neck:     Thyroid: No thyromegaly.  Cardiovascular:     Rate and Rhythm: Normal rate and regular rhythm.     Heart sounds: Normal heart sounds. No murmur heard. Pulmonary:     Effort: Pulmonary effort is normal. No respiratory distress.     Breath sounds: Normal breath sounds. No wheezing.  Musculoskeletal:        General: Normal range of motion.     Cervical back: Neck supple.  Lymphadenopathy:     Cervical: No cervical adenopathy.  Skin:    General: Skin is warm and dry.     Findings: No rash.  Neurological:     Mental Status: He is alert and oriented to person, place, and time.     Coordination: Coordination normal.  Psychiatric:        Behavior: Behavior normal.    Results for orders placed or performed in visit on 06/01/21  Magnesium  Result Value Ref Range   Magnesium 1.8 1.7 - 2.4 mg/dL  Comprehensive metabolic panel  Result Value Ref Range   Sodium 137 135 - 145 mmol/L   Potassium 3.1 (L) 3.5 - 5.1 mmol/L   Chloride 105 98 - 111 mmol/L   CO2 26 22 - 32 mmol/L    Glucose, Bld 135 (H) 70 - 99 mg/dL   BUN 8 6 - 20 mg/dL   Creatinine, Ser 0.83 0.61 - 1.24 mg/dL   Calcium 8.6 (L) 8.9 - 10.3 mg/dL   Total Protein 6.3 (L) 6.5 - 8.1 g/dL   Albumin 3.5 3.5 - 5.0 g/dL   AST 37 15 - 41 U/L   ALT 32 0 - 44 U/L   Alkaline Phosphatase 174 (H) 38 - 126 U/L   Total Bilirubin 0.9 0.3 - 1.2 mg/dL   GFR, Estimated >  60 >60 mL/min   Anion gap 6 5 - 15  CBC with Differential  Result Value Ref Range   WBC 2.8 (L) 4.0 - 10.5 K/uL   RBC 3.71 (L) 4.22 - 5.81 MIL/uL   Hemoglobin 12.9 (L) 13.0 - 17.0 g/dL   HCT 38.8 (L) 39.0 - 52.0 %   MCV 104.6 (H) 80.0 - 100.0 fL   MCH 34.8 (H) 26.0 - 34.0 pg   MCHC 33.2 30.0 - 36.0 g/dL   RDW 16.2 (H) 11.5 - 15.5 %   Platelets 130 (L) 150 - 400 K/uL   nRBC 0.0 0.0 - 0.2 %   Neutrophils Relative % 46 %   Neutro Abs 1.3 (L) 1.7 - 7.7 K/uL   Lymphocytes Relative 34 %   Lymphs Abs 1.0 0.7 - 4.0 K/uL   Monocytes Relative 15 %   Monocytes Absolute 0.4 0.1 - 1.0 K/uL   Eosinophils Relative 3 %   Eosinophils Absolute 0.1 0.0 - 0.5 K/uL   Basophils Relative 1 %   Basophils Absolute 0.0 0.0 - 0.1 K/uL   Immature Granulocytes 1 %   Abs Immature Granulocytes 0.04 0.00 - 0.07 K/uL  CEA  Result Value Ref Range   CEA 4.4 0.0 - 4.7 ng/mL    Assessment & Plan:   Problem List Items Addressed This Visit       Cardiovascular and Mediastinum   HTN (hypertension)   Relevant Medications   lisinopril (ZESTRIL) 10 MG tablet   pravastatin (PRAVACHOL) 40 MG tablet     Other   HLD (hyperlipidemia)   Relevant Medications   lisinopril (ZESTRIL) 10 MG tablet   pravastatin (PRAVACHOL) 40 MG tablet   Other Relevant Orders   Lipid panel   CBC with Differential/Platelet   Other Visit Diagnoses     Physical exam    -  Primary   Relevant Orders   PSA, total and free   Essential hypertension       Relevant Medications   lisinopril (ZESTRIL) 10 MG tablet   pravastatin (PRAVACHOL) 40 MG tablet   Other Relevant Orders   Lipid panel    CBC with Differential/Platelet       Patient is doing chemotherapy and continue with that.  Continue current medicine no changes, will check blood work.  They have checked most of his blood work very frequently because of the chemotherapy. Follow up plan: Return in about 6 months (around 12/07/2021), or if symptoms worsen or fail to improve, for Hypertension and cholesterol.  Counseling provided for all of the vaccine components Orders Placed This Encounter  Procedures   Lipid panel   CBC with Differential/Platelet   PSA, total and free    Caryl Pina, MD [facility] 06/06/2021, 3:38 PM

## 2021-06-07 LAB — CBC WITH DIFFERENTIAL/PLATELET
Basophils Absolute: 0.1 10*3/uL (ref 0.0–0.2)
Basos: 1 %
EOS (ABSOLUTE): 0.1 10*3/uL (ref 0.0–0.4)
Eos: 1 %
Hematocrit: 43.9 % (ref 37.5–51.0)
Hemoglobin: 14.6 g/dL (ref 13.0–17.7)
Immature Grans (Abs): 0.2 10*3/uL — ABNORMAL HIGH (ref 0.0–0.1)
Immature Granulocytes: 1 %
Lymphocytes Absolute: 1.6 10*3/uL (ref 0.7–3.1)
Lymphs: 8 %
MCH: 34.4 pg — ABNORMAL HIGH (ref 26.6–33.0)
MCHC: 33.3 g/dL (ref 31.5–35.7)
MCV: 104 fL — ABNORMAL HIGH (ref 79–97)
Monocytes Absolute: 0.3 10*3/uL (ref 0.1–0.9)
Monocytes: 1 %
Neutrophils Absolute: 18.7 10*3/uL — ABNORMAL HIGH (ref 1.4–7.0)
Neutrophils: 88 %
Platelets: 191 10*3/uL (ref 150–450)
RBC: 4.24 x10E6/uL (ref 4.14–5.80)
RDW: 15.8 % — ABNORMAL HIGH (ref 11.6–15.4)
WBC: 21 10*3/uL (ref 3.4–10.8)

## 2021-06-07 LAB — LIPID PANEL
Chol/HDL Ratio: 3.2 ratio (ref 0.0–5.0)
Cholesterol, Total: 155 mg/dL (ref 100–199)
HDL: 48 mg/dL (ref >39)
LDL Chol Calc (NIH): 78 mg/dL (ref 0–99)
Triglycerides: 170 mg/dL — ABNORMAL HIGH (ref 0–149)
VLDL Cholesterol Cal: 29 mg/dL (ref 5–40)

## 2021-06-07 LAB — PSA, TOTAL AND FREE
PSA, Free Pct: 17.9 %
PSA, Free: 0.43 ng/mL
Prostate Specific Ag, Serum: 2.4 ng/mL (ref 0.0–4.0)

## 2021-06-13 ENCOUNTER — Telehealth: Payer: Self-pay | Admitting: Family Medicine

## 2021-06-13 NOTE — Telephone Encounter (Signed)
Rash on Abdominal area, wraps around side and to back  - no large spots = all is very fine = sunburn look to it. It does not hurt or anything.  No where else on body.  Hasn't tried any meds - some better now - more on the side area - aware to try some cortisone cream on the area.   Dr Dettinger - this was from his #1 of 2 Shingrix vaccines. Would you recommend he get the #2 dose in 2-6 months??

## 2021-06-13 NOTE — Telephone Encounter (Signed)
This does not sound like what I would expect an allergic reaction rash to look like, did he take a picture of it and if he did any uploaded to me so I can look at it.  If not I would say that I think he is still okay to get that #2 and he may just want to take some Benadryl the morning before he gets the second 1

## 2021-06-13 NOTE — Telephone Encounter (Signed)
Pt understood. States that he does believe he has chigger bites as well. So may be he came in to contact with something outside. Pt will try to send a picture through [facility].
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
## 2021-06-15 ENCOUNTER — Other Ambulatory Visit: Payer: Self-pay

## 2021-06-15 ENCOUNTER — Inpatient Hospital Stay (HOSPITAL_BASED_OUTPATIENT_CLINIC_OR_DEPARTMENT_OTHER): Payer: BLUE CROSS/BLUE SHIELD | Admitting: Hematology

## 2021-06-15 ENCOUNTER — Inpatient Hospital Stay (HOSPITAL_COMMUNITY): Payer: BLUE CROSS/BLUE SHIELD

## 2021-06-15 VITALS — BP 119/76 | HR 70 | Temp 97.5°F | Resp 18

## 2021-06-15 VITALS — BP 124/81 | HR 79 | Temp 97.3°F | Resp 18 | Wt 215.6 lb

## 2021-06-15 DIAGNOSIS — Z5189 Encounter for other specified aftercare: Secondary | ICD-10-CM | POA: Diagnosis not present

## 2021-06-15 DIAGNOSIS — R0602 Shortness of breath: Secondary | ICD-10-CM | POA: Diagnosis not present

## 2021-06-15 DIAGNOSIS — G629 Polyneuropathy, unspecified: Secondary | ICD-10-CM | POA: Diagnosis not present

## 2021-06-15 DIAGNOSIS — C182 Malignant neoplasm of ascending colon: Secondary | ICD-10-CM

## 2021-06-15 DIAGNOSIS — R5383 Other fatigue: Secondary | ICD-10-CM | POA: Diagnosis not present

## 2021-06-15 DIAGNOSIS — R599 Enlarged lymph nodes, unspecified: Secondary | ICD-10-CM | POA: Diagnosis not present

## 2021-06-15 DIAGNOSIS — Z95828 Presence of other vascular implants and grafts: Secondary | ICD-10-CM

## 2021-06-15 DIAGNOSIS — R918 Other nonspecific abnormal finding of lung field: Secondary | ICD-10-CM | POA: Diagnosis not present

## 2021-06-15 DIAGNOSIS — D696 Thrombocytopenia, unspecified: Secondary | ICD-10-CM | POA: Diagnosis not present

## 2021-06-15 DIAGNOSIS — R197 Diarrhea, unspecified: Secondary | ICD-10-CM | POA: Diagnosis not present

## 2021-06-15 DIAGNOSIS — Z5111 Encounter for antineoplastic chemotherapy: Secondary | ICD-10-CM | POA: Diagnosis not present

## 2021-06-15 DIAGNOSIS — R519 Headache, unspecified: Secondary | ICD-10-CM | POA: Diagnosis not present

## 2021-06-15 DIAGNOSIS — R200 Anesthesia of skin: Secondary | ICD-10-CM | POA: Diagnosis not present

## 2021-06-15 DIAGNOSIS — R748 Abnormal levels of other serum enzymes: Secondary | ICD-10-CM | POA: Diagnosis not present

## 2021-06-15 DIAGNOSIS — G479 Sleep disorder, unspecified: Secondary | ICD-10-CM | POA: Diagnosis not present

## 2021-06-15 DIAGNOSIS — E876 Hypokalemia: Secondary | ICD-10-CM | POA: Diagnosis not present

## 2021-06-15 LAB — CBC WITH DIFFERENTIAL/PLATELET
Abs Immature Granulocytes: 0.05 10*3/uL (ref 0.00–0.07)
Basophils Absolute: 0.1 10*3/uL (ref 0.0–0.1)
Basophils Relative: 1 %
Eosinophils Absolute: 0.2 10*3/uL (ref 0.0–0.5)
Eosinophils Relative: 4 %
HCT: 40.3 % (ref 39.0–52.0)
Hemoglobin: 13.6 g/dL (ref 13.0–17.0)
Immature Granulocytes: 1 %
Lymphocytes Relative: 28 %
Lymphs Abs: 1.2 10*3/uL (ref 0.7–4.0)
MCH: 35.9 pg — ABNORMAL HIGH (ref 26.0–34.0)
MCHC: 33.7 g/dL (ref 30.0–36.0)
MCV: 106.3 fL — ABNORMAL HIGH (ref 80.0–100.0)
Monocytes Absolute: 0.5 10*3/uL (ref 0.1–1.0)
Monocytes Relative: 11 %
Neutro Abs: 2.4 10*3/uL (ref 1.7–7.7)
Neutrophils Relative %: 55 %
Platelets: 163 10*3/uL (ref 150–400)
RBC: 3.79 MIL/uL — ABNORMAL LOW (ref 4.22–5.81)
RDW: 15.4 % (ref 11.5–15.5)
WBC: 4.3 10*3/uL (ref 4.0–10.5)
nRBC: 0 % (ref 0.0–0.2)

## 2021-06-15 LAB — COMPREHENSIVE METABOLIC PANEL
ALT: 30 U/L (ref 0–44)
AST: 37 U/L (ref 15–41)
Albumin: 3.6 g/dL (ref 3.5–5.0)
Alkaline Phosphatase: 162 U/L — ABNORMAL HIGH (ref 38–126)
Anion gap: 7 (ref 5–15)
BUN: 9 mg/dL (ref 6–20)
CO2: 25 mmol/L (ref 22–32)
Calcium: 9.2 mg/dL (ref 8.9–10.3)
Chloride: 105 mmol/L (ref 98–111)
Creatinine, Ser: 0.76 mg/dL (ref 0.61–1.24)
GFR, Estimated: >60 mL/min (ref >60)
Glucose, Bld: 127 mg/dL — ABNORMAL HIGH (ref 70–99)
Potassium: 3.3 mmol/L — ABNORMAL LOW (ref 3.5–5.1)
Sodium: 137 mmol/L (ref 135–145)
Total Bilirubin: 0.7 mg/dL (ref 0.3–1.2)
Total Protein: 6.5 g/dL (ref 6.5–8.1)

## 2021-06-15 LAB — MAGNESIUM: Magnesium: 1.9 mg/dL (ref 1.7–2.4)

## 2021-06-15 MED ORDER — SODIUM CHLORIDE 0.9% FLUSH
10.0000 mL | INTRAVENOUS | Status: DC | PRN
  Administered 2021-06-15: 10 mL

## 2021-06-15 MED ORDER — LEUCOVORIN CALCIUM INJECTION 350 MG
400.0000 mg/m2 | Freq: Once | INTRAVENOUS | Status: AC
  Administered 2021-06-15: 872 mg via INTRAVENOUS
  Filled 2021-06-15: qty 43.6

## 2021-06-15 MED ORDER — FLUOROURACIL CHEMO INJECTION 2.5 GM/50ML
400.0000 mg/m2 | Freq: Once | INTRAVENOUS | Status: AC
  Administered 2021-06-15: 850 mg via INTRAVENOUS
  Filled 2021-06-15: qty 17

## 2021-06-15 MED ORDER — SODIUM CHLORIDE 0.9 % IV SOLN
2300.0000 mg/m2 | INTRAVENOUS | Status: DC
  Administered 2021-06-15: 5000 mg via INTRAVENOUS
  Filled 2021-06-15: qty 100

## 2021-06-15 MED ORDER — FAMOTIDINE 20 MG IN NS 100 ML IVPB
20.0000 mg | Freq: Once | INTRAVENOUS | Status: AC
  Administered 2021-06-15: 20 mg via INTRAVENOUS
  Filled 2021-06-15: qty 100

## 2021-06-15 MED ORDER — HEPARIN SOD (PORK) LOCK FLUSH 100 UNIT/ML IV SOLN: 500.0000 [IU] | Freq: Once | INTRAVENOUS | Status: DC | PRN

## 2021-06-15 MED ORDER — DIPHENHYDRAMINE HCL 50 MG/ML IJ SOLN
50.0000 mg | Freq: Once | INTRAMUSCULAR | Status: AC
  Administered 2021-06-15: 50 mg via INTRAVENOUS
  Filled 2021-06-15: qty 1

## 2021-06-15 MED ORDER — DEXTROSE 5 % IV SOLN: Freq: Once | INTRAVENOUS | Status: AC

## 2021-06-15 MED ORDER — LEUCOVORIN CALCIUM INJECTION 350 MG
400.0000 mg/m2 | Freq: Once | INTRAVENOUS | Status: DC
  Filled 2021-06-15: qty 43.6

## 2021-06-15 MED ORDER — SODIUM CHLORIDE 0.9 % IV SOLN
10.0000 mg | Freq: Once | INTRAVENOUS | Status: AC
  Administered 2021-06-15: 10 mg via INTRAVENOUS
  Filled 2021-06-15: qty 10

## 2021-06-15 MED ORDER — PALONOSETRON HCL INJECTION 0.25 MG/5ML
0.2500 mg | Freq: Once | INTRAVENOUS | Status: AC
  Administered 2021-06-15: 0.25 mg via INTRAVENOUS
  Filled 2021-06-15: qty 5

## 2021-06-15 NOTE — Patient Instructions (Signed)
[facility]  Discharge Instructions: Thank you for choosing [facility] to provide your oncology and hematology care.  If you have a lab appointment with the [facility], please come in thru the Main Entrance and check in at the main information desk.  Wear comfortable clothing and clothing appropriate for easy access to any Portacath or PICC line.   We strive to give you quality time with your provider. You may need to reschedule your appointment if you arrive late (15 or more minutes).  Arriving late affects you and other patients whose appointments are after yours.  Also, if you miss three or more appointments without notifying the office, you may be dismissed from the clinic at the provider's discretion.      For prescription refill requests, have your pharmacy contact our office and allow 72 hours for refills to be completed.    Today you received the following chemotherapy and/or immunotherapy agents: Leucovorin and 5FU pump was connected.   To help prevent nausea and vomiting after your treatment, we encourage you to take your nausea medication as directed.  BELOW ARE SYMPTOMS THAT SHOULD BE REPORTED IMMEDIATELY: *FEVER GREATER THAN 100.4 F (38 C) OR HIGHER *CHILLS OR SWEATING *NAUSEA AND VOMITING THAT IS NOT CONTROLLED WITH YOUR NAUSEA MEDICATION *UNUSUAL SHORTNESS OF BREATH *UNUSUAL BRUISING OR BLEEDING *URINARY PROBLEMS (pain or burning when urinating, or frequent urination) *BOWEL PROBLEMS (unusual diarrhea, constipation, pain near the anus) TENDERNESS IN MOUTH AND THROAT WITH OR WITHOUT PRESENCE OF ULCERS (sore throat, sores in mouth, or a toothache) UNUSUAL RASH, SWELLING OR PAIN  UNUSUAL VAGINAL DISCHARGE OR ITCHING   Items with * indicate a potential emergency and should be followed up as soon as possible or go to the Emergency Department if any problems should occur.  Please show the CHEMOTHERAPY ALERT CARD or IMMUNOTHERAPY ALERT CARD at  check-in to the Emergency Department and triage nurse.  Should you have questions after your visit or need to cancel or reschedule your appointment, please contact [facility] [phone]  and follow the prompts.  Office hours are 8:00 a.m. to 4:30 p.m. Monday - Friday. Please note that voicemails left after 4:00 p.m. may not be returned until the following business day.  We are closed weekends and major holidays. You have access to a nurse at all times for urgent questions. Please call the main number to the clinic [phone] and follow the prompts.  For any non-urgent questions, you may also contact your provider using MyChart. We now offer e-Visits for anyone 41 and older to request care online for non-urgent symptoms. For details visit [email or web address].   Also download the MyChart app! Go to the app store, search "MyChart", open the app, select [facility], and log in with your MyChart username and password.  Due to Covid, a mask is required upon entering the hospital/clinic. If you do not have a mask, one will be given to you upon arrival. For doctor visits, patients may have 1 support person aged 73 or older with them. For treatment visits, patients cannot have anyone with them due to current Covid guidelines and our immunocompromised population.

## 2021-06-15 NOTE — Progress Notes (Signed)
Patient tolerated chemotherapy with no complaints voiced. Side effects with management reviewed understanding verbalized. Port site clean and dry with no bruising or swelling noted at site. Good blood return noted before and after administration of chemotherapy. Chemo pump connected with no alarms noted. Patient left in satisfactory condition with VSS and no s/s of distress noted.  °

## 2021-06-15 NOTE — Progress Notes (Signed)
Patient has been assessed, vital signs and labs have been reviewed by Dr. Katragadda. ANC, Creatinine, LFTs, and Platelets are within treatment parameters per Dr. Katragadda. The patient is good to proceed with treatment at this time. Primary RN and pharmacy aware.  

## 2021-06-15 NOTE — Patient Instructions (Addendum)
[facility] Discharge Instructions  You were seen today by Dr. Katragadda. He went over your recent results, and you received your treatment. Dr. Katragadda will see you back in 2 weeks for labs and follow up.   Thank you for choosing [facility] at [facility] to provide your oncology and hematology care.  To afford each patient quality time with our provider, please arrive at least 15 minutes before your scheduled appointment time.   If you have a lab appointment with the Cancer Center please come in thru the Main Entrance and check in at the main information desk  You need to re-schedule your appointment should you arrive 10 or more minutes late.  We strive to give you quality time with our providers, and arriving late affects you and other patients whose appointments are after yours.  Also, if you no show three or more times for appointments you may be dismissed from the clinic at the providers discretion.     Again, thank you for choosing [facility].  Our hope is that these requests will decrease the amount of time that you wait before being seen by our physicians.       _____________________________________________________________  Should you have questions after your visit to [facility], please contact our office at [phone] between the hours of 8:00 a.m. and 4:30 p.m.  Voicemails left after 4:00 p.m. will not be returned until the following business day.  For prescription refill requests, have your pharmacy contact our office and allow 72 hours.    Cancer Center Support Programs:   > Cancer Support Group  2nd Tuesday of the month 1pm-2pm, Journey Room   

## 2021-06-17 ENCOUNTER — Other Ambulatory Visit: Payer: Self-pay

## 2021-06-17 ENCOUNTER — Inpatient Hospital Stay (HOSPITAL_COMMUNITY): Payer: BLUE CROSS/BLUE SHIELD

## 2021-06-17 ENCOUNTER — Encounter (HOSPITAL_COMMUNITY): Payer: Self-pay

## 2021-06-17 VITALS — BP 111/66 | HR 89 | Temp 97.9°F | Resp 18

## 2021-06-17 DIAGNOSIS — R519 Headache, unspecified: Secondary | ICD-10-CM | POA: Diagnosis not present

## 2021-06-17 DIAGNOSIS — G479 Sleep disorder, unspecified: Secondary | ICD-10-CM | POA: Diagnosis not present

## 2021-06-17 DIAGNOSIS — R5383 Other fatigue: Secondary | ICD-10-CM | POA: Diagnosis not present

## 2021-06-17 DIAGNOSIS — R918 Other nonspecific abnormal finding of lung field: Secondary | ICD-10-CM | POA: Diagnosis not present

## 2021-06-17 DIAGNOSIS — Z5111 Encounter for antineoplastic chemotherapy: Secondary | ICD-10-CM | POA: Diagnosis not present

## 2021-06-17 DIAGNOSIS — R748 Abnormal levels of other serum enzymes: Secondary | ICD-10-CM | POA: Diagnosis not present

## 2021-06-17 DIAGNOSIS — R200 Anesthesia of skin: Secondary | ICD-10-CM | POA: Diagnosis not present

## 2021-06-17 DIAGNOSIS — C182 Malignant neoplasm of ascending colon: Secondary | ICD-10-CM | POA: Diagnosis not present

## 2021-06-17 DIAGNOSIS — Z5189 Encounter for other specified aftercare: Secondary | ICD-10-CM | POA: Diagnosis not present

## 2021-06-17 DIAGNOSIS — E876 Hypokalemia: Secondary | ICD-10-CM | POA: Diagnosis not present

## 2021-06-17 DIAGNOSIS — D696 Thrombocytopenia, unspecified: Secondary | ICD-10-CM | POA: Diagnosis not present

## 2021-06-17 DIAGNOSIS — R599 Enlarged lymph nodes, unspecified: Secondary | ICD-10-CM | POA: Diagnosis not present

## 2021-06-17 DIAGNOSIS — R0602 Shortness of breath: Secondary | ICD-10-CM | POA: Diagnosis not present

## 2021-06-17 DIAGNOSIS — R197 Diarrhea, unspecified: Secondary | ICD-10-CM | POA: Diagnosis not present

## 2021-06-17 DIAGNOSIS — G629 Polyneuropathy, unspecified: Secondary | ICD-10-CM | POA: Diagnosis not present

## 2021-06-17 DIAGNOSIS — Z95828 Presence of other vascular implants and grafts: Secondary | ICD-10-CM

## 2021-06-17 MED ORDER — HEPARIN SOD (PORK) LOCK FLUSH 100 UNIT/ML IV SOLN
500.0000 [IU] | Freq: Once | INTRAVENOUS | Status: AC | PRN
  Administered 2021-06-17: 500 [IU]

## 2021-06-17 MED ORDER — PEGFILGRASTIM-CBQV 6 MG/0.6ML ~~LOC~~ SOSY
6.0000 mg | PREFILLED_SYRINGE | Freq: Once | SUBCUTANEOUS | Status: AC
  Administered 2021-06-17: 6 mg via SUBCUTANEOUS
  Filled 2021-06-17: qty 0.6

## 2021-06-17 MED ORDER — SODIUM CHLORIDE 0.9% FLUSH
10.0000 mL | INTRAVENOUS | Status: DC | PRN
  Administered 2021-06-17: 10 mL

## 2021-06-17 NOTE — Patient Instructions (Signed)
[facility]  Discharge Instructions: Thank you for choosing [facility] to provide your oncology and hematology care.  If you have a lab appointment with the [facility], please come in thru the Main Entrance and check in at the main information desk.  Wear comfortable clothing and clothing appropriate for easy access to any Portacath or PICC line.   We strive to give you quality time with your provider. You may need to reschedule your appointment if you arrive late (15 or more minutes).  Arriving late affects you and other patients whose appointments are after yours.  Also, if you miss three or more appointments without notifying the office, you may be dismissed from the clinic at the provider's discretion.      For prescription refill requests, have your pharmacy contact our office and allow 72 hours for refills to be completed.    Today you received the following chemotherapy and/or immunotherapy agents: Undenyca injection, and your chemo pump was disconnected. Return as scheduled.   To help prevent nausea and vomiting after your treatment, we encourage you to take your nausea medication as directed.  BELOW ARE SYMPTOMS THAT SHOULD BE REPORTED IMMEDIATELY: *FEVER GREATER THAN 100.4 F (38 C) OR HIGHER *CHILLS OR SWEATING *NAUSEA AND VOMITING THAT IS NOT CONTROLLED WITH YOUR NAUSEA MEDICATION *UNUSUAL SHORTNESS OF BREATH *UNUSUAL BRUISING OR BLEEDING *URINARY PROBLEMS (pain or burning when urinating, or frequent urination) *BOWEL PROBLEMS (unusual diarrhea, constipation, pain near the anus) TENDERNESS IN MOUTH AND THROAT WITH OR WITHOUT PRESENCE OF ULCERS (sore throat, sores in mouth, or a toothache) UNUSUAL RASH, SWELLING OR PAIN  UNUSUAL VAGINAL DISCHARGE OR ITCHING   Items with * indicate a potential emergency and should be followed up as soon as possible or go to the Emergency Department if any problems should occur.  Please show the CHEMOTHERAPY ALERT  CARD or IMMUNOTHERAPY ALERT CARD at check-in to the Emergency Department and triage nurse.  Should you have questions after your visit or need to cancel or reschedule your appointment, please contact [facility] [phone]  and follow the prompts.  Office hours are 8:00 a.m. to 4:30 p.m. Monday - Friday. Please note that voicemails left after 4:00 p.m. may not be returned until the following business day.  We are closed weekends and major holidays. You have access to a nurse at all times for urgent questions. Please call the main number to the clinic [phone] and follow the prompts.  For any non-urgent questions, you may also contact your provider using MyChart. We now offer e-Visits for anyone 39 and older to request care online for non-urgent symptoms. For details visit [email or web address].   Also download the MyChart app! Go to the app store, search "MyChart", open the app, select [facility], and log in with your MyChart username and password.  Due to Covid, a mask is required upon entering the hospital/clinic. If you do not have a mask, one will be given to you upon arrival. For doctor visits, patients may have 1 support person aged 57 or older with them. For treatment visits, patients cannot have anyone with them due to current Covid guidelines and our immunocompromised population.

## 2021-06-17 NOTE — Progress Notes (Signed)
Patient arrived today for Pump d/c. Chemo pump disconnect, port flushed with good blood return noted. No bruising or swelling at site. Bandaid applied.  Patient tolerated Undenyca injection with no complaints voiced. Site clean and dry with no bruising or swelling noted at site. See MAR for details. Band aid applied.  Patient stable during and after injection. VSS with discharge and left in satisfactory condition with no s/s of distress noted.

## 2021-06-20 DIAGNOSIS — Z23 Encounter for immunization: Secondary | ICD-10-CM | POA: Diagnosis not present

## 2021-06-20 NOTE — Addendum Note (Signed)
Addended by: Alphonzo Dublin on: 06/20/2021 03:07 PM   Modules accepted: Orders

## 2021-06-23 ENCOUNTER — Other Ambulatory Visit (HOSPITAL_COMMUNITY): Payer: Self-pay | Admitting: Hematology

## 2021-06-27 DIAGNOSIS — C182 Malignant neoplasm of ascending colon: Secondary | ICD-10-CM | POA: Diagnosis not present
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
## 2021-06-29 ENCOUNTER — Other Ambulatory Visit: Payer: Self-pay

## 2021-06-29 ENCOUNTER — Inpatient Hospital Stay (HOSPITAL_COMMUNITY): Payer: BLUE CROSS/BLUE SHIELD

## 2021-06-29 ENCOUNTER — Inpatient Hospital Stay (HOSPITAL_BASED_OUTPATIENT_CLINIC_OR_DEPARTMENT_OTHER): Payer: BLUE CROSS/BLUE SHIELD | Admitting: Hematology

## 2021-06-29 VITALS — BP 111/72 | HR 71 | Temp 97.2°F | Resp 18

## 2021-06-29 DIAGNOSIS — G629 Polyneuropathy, unspecified: Secondary | ICD-10-CM | POA: Diagnosis not present

## 2021-06-29 DIAGNOSIS — R200 Anesthesia of skin: Secondary | ICD-10-CM | POA: Diagnosis not present

## 2021-06-29 DIAGNOSIS — R599 Enlarged lymph nodes, unspecified: Secondary | ICD-10-CM | POA: Diagnosis not present

## 2021-06-29 DIAGNOSIS — G479 Sleep disorder, unspecified: Secondary | ICD-10-CM | POA: Diagnosis not present

## 2021-06-29 DIAGNOSIS — D696 Thrombocytopenia, unspecified: Secondary | ICD-10-CM | POA: Diagnosis not present

## 2021-06-29 DIAGNOSIS — R0602 Shortness of breath: Secondary | ICD-10-CM | POA: Diagnosis not present

## 2021-06-29 DIAGNOSIS — R748 Abnormal levels of other serum enzymes: Secondary | ICD-10-CM | POA: Diagnosis not present

## 2021-06-29 DIAGNOSIS — R519 Headache, unspecified: Secondary | ICD-10-CM | POA: Diagnosis not present

## 2021-06-29 DIAGNOSIS — C182 Malignant neoplasm of ascending colon: Secondary | ICD-10-CM

## 2021-06-29 DIAGNOSIS — Z5189 Encounter for other specified aftercare: Secondary | ICD-10-CM | POA: Diagnosis not present

## 2021-06-29 DIAGNOSIS — R918 Other nonspecific abnormal finding of lung field: Secondary | ICD-10-CM | POA: Diagnosis not present

## 2021-06-29 DIAGNOSIS — R197 Diarrhea, unspecified: Secondary | ICD-10-CM | POA: Diagnosis not present

## 2021-06-29 DIAGNOSIS — R5383 Other fatigue: Secondary | ICD-10-CM | POA: Diagnosis not present

## 2021-06-29 DIAGNOSIS — Z95828 Presence of other vascular implants and grafts: Secondary | ICD-10-CM

## 2021-06-29 DIAGNOSIS — E876 Hypokalemia: Secondary | ICD-10-CM | POA: Diagnosis not present

## 2021-06-29 DIAGNOSIS — Z5111 Encounter for antineoplastic chemotherapy: Secondary | ICD-10-CM | POA: Diagnosis not present

## 2021-06-29 LAB — MAGNESIUM: Magnesium: 1.8 mg/dL (ref 1.7–2.4)

## 2021-06-29 LAB — CBC WITH DIFFERENTIAL/PLATELET
Abs Immature Granulocytes: 0.03 10*3/uL (ref 0.00–0.07)
Basophils Absolute: 0 10*3/uL (ref 0.0–0.1)
Basophils Relative: 1 %
Eosinophils Absolute: 0.2 10*3/uL (ref 0.0–0.5)
Eosinophils Relative: 4 %
HCT: 40.5 % (ref 39.0–52.0)
Hemoglobin: 13.7 g/dL (ref 13.0–17.0)
Immature Granulocytes: 1 %
Lymphocytes Relative: 22 %
Lymphs Abs: 1 10*3/uL (ref 0.7–4.0)
MCH: 36.5 pg — ABNORMAL HIGH (ref 26.0–34.0)
MCHC: 33.8 g/dL (ref 30.0–36.0)
MCV: 108 fL — ABNORMAL HIGH (ref 80.0–100.0)
Monocytes Absolute: 0.4 10*3/uL (ref 0.1–1.0)
Monocytes Relative: 8 %
Neutro Abs: 3 10*3/uL (ref 1.7–7.7)
Neutrophils Relative %: 64 %
Platelets: 146 10*3/uL — ABNORMAL LOW (ref 150–400)
RBC: 3.75 MIL/uL — ABNORMAL LOW (ref 4.22–5.81)
RDW: 15.1 % (ref 11.5–15.5)
WBC: 4.7 10*3/uL (ref 4.0–10.5)
nRBC: 0 % (ref 0.0–0.2)

## 2021-06-29 LAB — COMPREHENSIVE METABOLIC PANEL
ALT: 24 U/L (ref 0–44)
AST: 30 U/L (ref 15–41)
Albumin: 3.7 g/dL (ref 3.5–5.0)
Alkaline Phosphatase: 154 U/L — ABNORMAL HIGH (ref 38–126)
Anion gap: 7 (ref 5–15)
BUN: 9 mg/dL (ref 6–20)
CO2: 27 mmol/L (ref 22–32)
Calcium: 9.1 mg/dL (ref 8.9–10.3)
Chloride: 105 mmol/L (ref 98–111)
Creatinine, Ser: 0.83 mg/dL (ref 0.61–1.24)
GFR, Estimated: >60 mL/min (ref >60)
Glucose, Bld: 138 mg/dL — ABNORMAL HIGH (ref 70–99)
Potassium: 3.3 mmol/L — ABNORMAL LOW (ref 3.5–5.1)
Sodium: 139 mmol/L (ref 135–145)
Total Bilirubin: 0.6 mg/dL (ref 0.3–1.2)
Total Protein: 6.6 g/dL (ref 6.5–8.1)

## 2021-06-29 MED ORDER — DEXTROSE 5 % IV SOLN: Freq: Once | INTRAVENOUS | Status: AC

## 2021-06-29 MED ORDER — FAMOTIDINE 20 MG IN NS 100 ML IVPB
20.0000 mg | Freq: Once | INTRAVENOUS | Status: AC
  Administered 2021-06-29: 20 mg via INTRAVENOUS
  Filled 2021-06-29: qty 100

## 2021-06-29 MED ORDER — SODIUM CHLORIDE 0.9 % IV SOLN
10.0000 mg | Freq: Once | INTRAVENOUS | Status: AC
  Administered 2021-06-29: 10 mg via INTRAVENOUS
  Filled 2021-06-29: qty 10

## 2021-06-29 MED ORDER — FLUOROURACIL CHEMO INJECTION 2.5 GM/50ML
400.0000 mg/m2 | Freq: Once | INTRAVENOUS | Status: AC
Start: 2021-06-29 — End: 2021-06-29
  Administered 2021-06-29: 850 mg via INTRAVENOUS
  Filled 2021-06-29: qty 17

## 2021-06-29 MED ORDER — LEUCOVORIN CALCIUM INJECTION 350 MG
400.0000 mg/m2 | Freq: Once | INTRAVENOUS | Status: AC
  Administered 2021-06-29: 872 mg via INTRAVENOUS
  Filled 2021-06-29: qty 43.6

## 2021-06-29 MED ORDER — PALONOSETRON HCL INJECTION 0.25 MG/5ML
0.2500 mg | Freq: Once | INTRAVENOUS | Status: AC
  Administered 2021-06-29: 0.25 mg via INTRAVENOUS
  Filled 2021-06-29: qty 5

## 2021-06-29 MED ORDER — DIPHENHYDRAMINE HCL 50 MG/ML IJ SOLN
50.0000 mg | Freq: Once | INTRAMUSCULAR | Status: AC
  Administered 2021-06-29: 50 mg via INTRAVENOUS
  Filled 2021-06-29: qty 1

## 2021-06-29 MED ORDER — SODIUM CHLORIDE 0.9 % IV SOLN: Freq: Once | INTRAVENOUS | Status: AC

## 2021-06-29 MED ORDER — FLUOROURACIL CHEMO INJECTION 5 GM/100ML
2300.0000 mg/m2 | INTRAVENOUS | Status: DC
  Administered 2021-06-29: 5000 mg via INTRAVENOUS
  Filled 2021-06-29: qty 100

## 2021-06-29 NOTE — Progress Notes (Signed)
Patient presents today for Leucovorin, Fluorouracil, with 5FU pump start per providers order.  Vital signs within parameters for treatment.  Labs pending.  Patients only complaint is numbness in the fingertips since last treatment.  Labs reviewed and within parameters for treatment.  Treatment given today per MD orders.  Tolerated infusion without adverse affects.  5FU pump connected and verified RUN on the screen with the patient.  Vital signs stable.  No complaints at this time.  Discharge from clinic ambulatory in stable condition.  Alert and oriented X 3.  Follow up with [facility] as scheduled.

## 2021-06-29 NOTE — Patient Instructions (Addendum)
[facility] at [facility] Discharge Instructions  You were seen today by Dr. Delton Coombes. He went over your recent results, and you received your last treatment. You will need to return to the clinic every 3 months to have your port flushed. You will be scheduled for a CT scan of your abdomen, and pelvis prior to your next visit. Dr. Delton Coombes will see you back in 4 months for labs and follow up.   Thank you for choosing [place] at [facility] to provide your oncology and hematology care.  To afford each patient quality time with our provider, please arrive at least 15 minutes before your scheduled appointment time.   If you have a lab appointment with the [facility] please come in thru the Main Entrance and check in at the main information desk  You need to re-schedule your appointment should you arrive 10 or more minutes late.  We strive to give you quality time with our providers, and arriving late affects you and other patients whose appointments are after yours.  Also, if you no show three or more times for appointments you may be dismissed from the clinic at the providers discretion.     Again, thank you for choosing [facility].  Our hope is that these requests will decrease the amount of time that you wait before being seen by our physicians.       _____________________________________________________________  Should you have questions after your visit to [facility], please contact our office at [phone] between the hours of 8:00 a.m. and 4:30 p.m.  Voicemails left after 4:00 p.m. will not be returned until the following business day.  For prescription refill requests, have your pharmacy contact our office and allow 72 hours.    Cancer Center Support Programs:   > Cancer Support Group  2nd Tuesday of the month 1pm-2pm, Journey Room

## 2021-06-29 NOTE — Progress Notes (Signed)
Patients port flushed without difficulty.  Good blood return noted with no bruising or swelling noted at site.  Stable during access and blood draw.  Patient to remain accessed for treatment. 

## 2021-06-29 NOTE — Patient Instructions (Signed)
[facility]  Discharge Instructions: Thank you for choosing [facility] to provide your oncology and hematology care.  If you have a lab appointment with the [facility], please come in thru the Main Entrance and check in at the main information desk.  Wear comfortable clothing and clothing appropriate for easy access to any Portacath or PICC line.   We strive to give you quality time with your provider. You may need to reschedule your appointment if you arrive late (15 or more minutes).  Arriving late affects you and other patients whose appointments are after yours.  Also, if you miss three or more appointments without notifying the office, you may be dismissed from the clinic at the provider's discretion.      For prescription refill requests, have your pharmacy contact our office and allow 72 hours for refills to be completed.    Today you received the following chemotherapy and/or immunotherapy agents leucovorin, fluorouracil and 5FU pump      To help prevent nausea and vomiting after your treatment, we encourage you to take your nausea medication as directed.  BELOW ARE SYMPTOMS THAT SHOULD BE REPORTED IMMEDIATELY: *FEVER GREATER THAN 100.4 F (38 C) OR HIGHER *CHILLS OR SWEATING *NAUSEA AND VOMITING THAT IS NOT CONTROLLED WITH YOUR NAUSEA MEDICATION *UNUSUAL SHORTNESS OF BREATH *UNUSUAL BRUISING OR BLEEDING *URINARY PROBLEMS (pain or burning when urinating, or frequent urination) *BOWEL PROBLEMS (unusual diarrhea, constipation, pain near the anus) TENDERNESS IN MOUTH AND THROAT WITH OR WITHOUT PRESENCE OF ULCERS (sore throat, sores in mouth, or a toothache) UNUSUAL RASH, SWELLING OR PAIN  UNUSUAL VAGINAL DISCHARGE OR ITCHING   Items with * indicate a potential emergency and should be followed up as soon as possible or go to the Emergency Department if any problems should occur.  Please show the CHEMOTHERAPY ALERT CARD or IMMUNOTHERAPY ALERT CARD at  check-in to the Emergency Department and triage nurse.  Should you have questions after your visit or need to cancel or reschedule your appointment, please contact [facility] [phone]  and follow the prompts.  Office hours are 8:00 a.m. to 4:30 p.m. Monday - Friday. Please note that voicemails left after 4:00 p.m. may not be returned until the following business day.  We are closed weekends and major holidays. You have access to a nurse at all times for urgent questions. Please call the main number to the clinic [phone] and follow the prompts.  For any non-urgent questions, you may also contact your provider using MyChart. We now offer e-Visits for anyone 97 and older to request care online for non-urgent symptoms. For details visit [email or web address].   Also download the MyChart app! Go to the app store, search "MyChart", open the app, select [facility], and log in with your MyChart username and password.  Due to Covid, a mask is required upon entering the hospital/clinic. If you do not have a mask, one will be given to you upon arrival. For doctor visits, patients may have 1 support person aged 79 or older with them. For treatment visits, patients cannot have anyone with them due to current Covid guidelines and our immunocompromised population.

## 2021-06-29 NOTE — Progress Notes (Signed)
Patient has been examined, vital signs and labs have been reviewed by Dr. Katragadda. ANC, Creatinine, LFTs, hemoglobin, and platelets are within treatment parameters per Dr. Katragadda. Patient is okay to proceed with treatment per M.D.   

## 2021-07-01 ENCOUNTER — Other Ambulatory Visit: Payer: Self-pay

## 2021-07-01 ENCOUNTER — Encounter (HOSPITAL_COMMUNITY): Payer: Self-pay

## 2021-07-01 ENCOUNTER — Inpatient Hospital Stay (HOSPITAL_COMMUNITY): Payer: BLUE CROSS/BLUE SHIELD | Attending: Hematology

## 2021-07-01 VITALS — BP 102/65 | HR 85 | Temp 98.4°F | Resp 18

## 2021-07-01 DIAGNOSIS — Z5189 Encounter for other specified aftercare: Secondary | ICD-10-CM | POA: Insufficient documentation

## 2021-07-01 DIAGNOSIS — Z95828 Presence of other vascular implants and grafts: Secondary | ICD-10-CM

## 2021-07-01 DIAGNOSIS — C182 Malignant neoplasm of ascending colon: Secondary | ICD-10-CM | POA: Diagnosis not present

## 2021-07-01 MED ORDER — HEPARIN SOD (PORK) LOCK FLUSH 100 UNIT/ML IV SOLN
500.0000 [IU] | Freq: Once | INTRAVENOUS | Status: AC | PRN
  Administered 2021-07-01: 500 [IU]

## 2021-07-01 MED ORDER — PEGFILGRASTIM-CBQV 6 MG/0.6ML ~~LOC~~ SOSY
6.0000 mg | PREFILLED_SYRINGE | Freq: Once | SUBCUTANEOUS | Status: AC
  Administered 2021-07-01: 6 mg via SUBCUTANEOUS
  Filled 2021-07-01: qty 0.6

## 2021-07-01 MED ORDER — SODIUM CHLORIDE 0.9% FLUSH
10.0000 mL | INTRAVENOUS | Status: DC | PRN
  Administered 2021-07-01: 10 mL

## 2021-07-01 NOTE — Progress Notes (Signed)
Patient presents today for pump d/c. Vital signs are stable. Port a cath site clean, dry, and intact. Port flushed with 10 mls of Normal Saline and 500 Units of Heparin. Needle removed intact. Band aid applied. Patient has no complaints at this time. Discharged from clinic ambulatory and in stable condition. Patient alert and oriented.  

## 2021-07-01 NOTE — Patient Instructions (Signed)
[facility]  Discharge Instructions: Thank you for choosing [facility] to provide your oncology and hematology care.  If you have a lab appointment with the [facility], please come in thru the Main Entrance and check in at the main information desk.  Wear comfortable clothing and clothing appropriate for easy access to any Portacath or PICC line.   We strive to give you quality time with your provider. You may need to reschedule your appointment if you arrive late (15 or more minutes).  Arriving late affects you and other patients whose appointments are after yours.  Also, if you miss three or more appointments without notifying the office, you may be dismissed from the clinic at the provider's discretion.      For prescription refill requests, have your pharmacy contact our office and allow 72 hours for refills to be completed.    Today you received the following chemotherapy and/or immunotherapy agents Adrucil/ 5FU      To help prevent nausea and vomiting after your treatment, we encourage you to take your nausea medication as directed.  BELOW ARE SYMPTOMS THAT SHOULD BE REPORTED IMMEDIATELY: *FEVER GREATER THAN 100.4 F (38 C) OR HIGHER *CHILLS OR SWEATING *NAUSEA AND VOMITING THAT IS NOT CONTROLLED WITH YOUR NAUSEA MEDICATION *UNUSUAL SHORTNESS OF BREATH *UNUSUAL BRUISING OR BLEEDING *URINARY PROBLEMS (pain or burning when urinating, or frequent urination) *BOWEL PROBLEMS (unusual diarrhea, constipation, pain near the anus) TENDERNESS IN MOUTH AND THROAT WITH OR WITHOUT PRESENCE OF ULCERS (sore throat, sores in mouth, or a toothache) UNUSUAL RASH, SWELLING OR PAIN  UNUSUAL VAGINAL DISCHARGE OR ITCHING   Items with * indicate a potential emergency and should be followed up as soon as possible or go to the Emergency Department if any problems should occur.  Please show the CHEMOTHERAPY ALERT CARD or IMMUNOTHERAPY ALERT CARD at check-in to the Emergency  Department and triage nurse.  Should you have questions after your visit or need to cancel or reschedule your appointment, please contact [facility] [phone]  and follow the prompts.  Office hours are 8:00 a.m. to 4:30 p.m. Monday - Friday. Please note that voicemails left after 4:00 p.m. may not be returned until the following business day.  We are closed weekends and major holidays. You have access to a nurse at all times for urgent questions. Please call the main number to the clinic [phone] and follow the prompts.  For any non-urgent questions, you may also contact your provider using MyChart. We now offer e-Visits for anyone 37 and older to request care online for non-urgent symptoms. For details visit [email or web address].   Also download the MyChart app! Go to the app store, search "MyChart", open the app, select [place], and log in with your MyChart username and password.  Due to Covid, a mask is required upon entering the hospital/clinic. If you do not have a mask, one will be given to you upon arrival. For doctor visits, patients may have 1 support person aged 56 or older with them. For treatment visits, patients cannot have anyone with them due to current Covid guidelines and our immunocompromised population.

## 2021-07-27 ENCOUNTER — Ambulatory Visit (INDEPENDENT_AMBULATORY_CARE_PROVIDER_SITE_OTHER): Payer: BLUE CROSS/BLUE SHIELD | Admitting: Neurology

## 2021-07-27 ENCOUNTER — Encounter: Payer: Self-pay | Admitting: Neurology

## 2021-07-27 ENCOUNTER — Other Ambulatory Visit: Payer: Self-pay

## 2021-07-27 VITALS — BP 113/77 | HR 84 | Resp 20 | Ht 68.0 in | Wt 219.0 lb

## 2021-07-27 DIAGNOSIS — G40219 Localization-related (focal) (partial) symptomatic epilepsy and epileptic syndromes with complex partial seizures, intractable, without status epilepticus: Secondary | ICD-10-CM

## 2021-07-27 MED ORDER — LAMOTRIGINE ER 300 MG PO TB24: ORAL_TABLET | ORAL | 3 refills | Status: DC

## 2021-07-27 MED ORDER — OXTELLAR XR 600 MG PO TB24: ORAL_TABLET | ORAL | 11 refills | Status: DC

## 2021-07-27 MED ORDER — LEVETIRACETAM 100 MG/ML PO SOLN: ORAL | 11 refills | Status: DC
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
## 2021-07-29 ENCOUNTER — Other Ambulatory Visit (HOSPITAL_COMMUNITY): Payer: Self-pay | Admitting: Hematology

## 2021-08-11 ENCOUNTER — Ambulatory Visit: Payer: BLUE CROSS/BLUE SHIELD | Admitting: Neurology

## 2021-08-15 ENCOUNTER — Encounter: Payer: Self-pay | Admitting: Gastroenterology

## 2021-08-15 ENCOUNTER — Telehealth: Payer: Self-pay | Admitting: Neurology

## 2021-08-15 NOTE — Telephone Encounter (Signed)
Pt said he is returning a call to set up an appt for cat scan

## 2021-08-16 NOTE — Telephone Encounter (Signed)
Returned patients call about Cat Scan. Patient stated that he got [place] confused with his Gastroenterology doctor and they were the ones who ordered the Cat Scan. Patient does not need anything from [place]. Patient had no further questions or concerns.

## 2021-08-18 ENCOUNTER — Telehealth: Payer: Self-pay | Admitting: *Deleted

## 2021-08-18 ENCOUNTER — Encounter (HOSPITAL_COMMUNITY): Payer: Self-pay | Admitting: Hematology

## 2021-08-18 DIAGNOSIS — R1310 Dysphagia, unspecified: Secondary | ICD-10-CM

## 2021-08-18 DIAGNOSIS — Z85038 Personal history of other malignant neoplasm of large intestine: Secondary | ICD-10-CM

## 2021-08-18 NOTE — Telephone Encounter (Signed)
Dr Rush Landmark- This patient is scheduled for previsit and colonoscopy 08/31/21 in [facility] for 1 year follow up post rectal CA. After review of his chart, it appears he has uncontrolled seizures (see Dr Delice Lesch note from 07/27/21). Would he be appropriate for direct hospital case or does he need office visit with you first?

## 2021-08-18 NOTE — Telephone Encounter (Signed)
Carlos Miranda, Please schedule this patient for a colonoscopy for colon cancer surveillance and an endoscopy for dysphagia (notes suggest he has had pill dysphagia and required liquid formulations) in the hospital-based setting.  This should be no sooner than December or January so that he is hopefully 3 months from his medication titration for his antiepileptics. Thanks. GM

## 2021-08-18 NOTE — Telephone Encounter (Signed)
Ro- Can you help me with available hospital dates for Dr Jerilynn Mages? I will schedule and speak to patient but just need your input for dates. Thx

## 2021-08-19 ENCOUNTER — Other Ambulatory Visit: Payer: Self-pay | Admitting: Gastroenterology

## 2021-08-19 ENCOUNTER — Encounter: Payer: Self-pay | Admitting: *Deleted

## 2021-08-19 DIAGNOSIS — R1310 Dysphagia, unspecified: Secondary | ICD-10-CM

## 2021-08-19 DIAGNOSIS — Z85038 Personal history of other malignant neoplasm of large intestine: Secondary | ICD-10-CM

## 2021-08-19 MED ORDER — PLENVU 140 G PO SOLR: 1.0000 | ORAL | 0 refills | Status: DC

## 2021-08-19 MED ORDER — SODIUM CHLORIDE 0.9% FLUSH: 10.0000 mL | Freq: Once | INTRAVENOUS | Status: DC

## 2021-08-19 NOTE — Telephone Encounter (Signed)
Patient has been scheduled for endoscopy/colonoscopy at [facility] with Dr Rush Landmark for 09/26/21 at 11:45 am (1015 am arrival).

## 2021-08-19 NOTE — Addendum Note (Signed)
Addended by: Larina Bras on: 08/19/2021 03:33 PM   Modules accepted: Orders

## 2021-08-19 NOTE — Addendum Note (Signed)
Addended by: Larina Bras on: 08/19/2021 03:13 PM   Modules accepted: Orders

## 2021-08-19 NOTE — Telephone Encounter (Signed)
[name]- Dr Rush Landmark next available hospital date is 09/26/21 Mon @ [place]  or 09/29/21 @ [place].

## 2021-08-19 NOTE — Telephone Encounter (Signed)
I have spoken to patient to advise of ECL at [facility] on 09/26/21 at 1145 am. I have given him detailed prep instructions and have advised that he will need a care partner 18 or older to bring him, stay with him and take him home the day of his test. Patient verbalizes understanding of this. I am sending written instructions via [employer] as well as through mychart.

## 2021-08-31 ENCOUNTER — Telehealth: Payer: Self-pay | Admitting: Neurology

## 2021-08-31 ENCOUNTER — Encounter: Payer: BLUE CROSS/BLUE SHIELD | Admitting: Gastroenterology

## 2021-08-31 NOTE — Telephone Encounter (Signed)
Pt wants to make sure it is safe for him to take  Viagra  please call

## 2021-09-01 NOTE — Telephone Encounter (Signed)
Pt called an informed that from a seizure standpoint there is no contraindication for his Viagra and that he needs to call his PCP to get a script for it that we do not prescribe it,

## 2021-09-01 NOTE — Telephone Encounter (Signed)
From a seizure standpoint, there is no contraindication. Thanks

## 2021-09-07 ENCOUNTER — Encounter: Payer: Self-pay | Admitting: Family Medicine

## 2021-09-07 ENCOUNTER — Other Ambulatory Visit: Payer: Self-pay

## 2021-09-07 ENCOUNTER — Ambulatory Visit (INDEPENDENT_AMBULATORY_CARE_PROVIDER_SITE_OTHER): Payer: BLUE CROSS/BLUE SHIELD | Admitting: Family Medicine

## 2021-09-07 VITALS — BP 124/79 | HR 87 | Ht 68.0 in | Wt 215.0 lb

## 2021-09-07 DIAGNOSIS — N529 Male erectile dysfunction, unspecified: Secondary | ICD-10-CM | POA: Diagnosis not present

## 2021-09-07 DIAGNOSIS — Z23 Encounter for immunization: Secondary | ICD-10-CM | POA: Diagnosis not present

## 2021-09-07 MED ORDER — SILDENAFIL CITRATE 20 MG PO TABS: 20.0000 mg | ORAL_TABLET | ORAL | 1 refills | Status: DC | PRN

## 2021-09-07 NOTE — Progress Notes (Signed)
BP 124/79   Pulse 87   Ht 5\' 8"  (1.727 m)   Wt 215 lb (97.5 kg)   SpO2 97%   BMI 32.69 kg/m    Subjective:   Patient ID: Carlos Miranda, male    DOB: 11/11/1960, 60 y.o.   MRN: [id number]  HPI: Carlos Miranda is a 60 y.o. male presenting on 09/07/2021 for No chief complaint on file.   HPI ED Patient is coming in today to discuss erectile dysfunction.  He says he has been having trouble for quite many years with erections.  He says it is soft and he cannot get it fully hard except early in the morning, he will awaken with an erection in the morning that is hard but it will not last more than 5 minutes.  He says he has not worried about it too much until recently, he has a new partner who he is dating, they have not been sexually active yet but they do plan to be sexually active and he is going down to her place over Christmas and wants to be prepared.  Relevant past medical, surgical, family and social history reviewed and updated as indicated. Interim medical history since our last visit reviewed. Allergies and medications reviewed and updated.  Review of Systems  Constitutional:  Negative for activity change, appetite change, chills and fever.  Respiratory:  Negative for shortness of breath and wheezing.   Cardiovascular:  Negative for chest pain and leg swelling.  Genitourinary:  Negative for penile discharge, penile pain and penile swelling.  All other systems reviewed and are negative.  Per HPI unless specifically indicated above   Allergies as of 09/07/2021       Reactions   Penicillins Swelling   Has patient had a PCN reaction causing    Furosemide Other (See Comments)   Advised to avoid this medication due to condition from childhood (Meninigitis)   Streptomycin Other (See Comments)   Avoid streptomycin, neomycin, and kanamycin due to deafness in one ear from meninigitis   Sulfa Antibiotics Other (See Comments)   Unknown reaction        Medication List         Accurate as of September 07, 2021  2:35 PM. If you have any questions, ask your nurse or doctor.          STOP taking these medications    ferrous sulfate 325 (65 FE) MG tablet Stopped by: Nils Pyle, MD   fluorouracil CALGB 40981 2,400 mg/m2 in sodium chloride 0.9 % 150 mL Stopped by: Nils Pyle, MD   FLUOROURACIL IV Stopped by: Elige Radon Jahmire Ruffins, MD   LEUCOVORIN CALCIUM IV Stopped by: Nils Pyle, MD   lidocaine-prilocaine cream Commonly known as: EMLA Stopped by: Elige Radon Jahquez Steffler, MD   oxaliplatin in dextrose 5 % 500 mL Stopped by: Nils Pyle, MD   Plenvu 140 g Solr Generic drug: PEG-KCl-NaCl-NaSulf-Na Asc-C Stopped by: Nils Pyle, MD   prochlorperazine 10 MG tablet Commonly known as: COMPAZINE Stopped by: Nils Pyle, MD       TAKE these medications    LamoTRIgine 300 MG Tb24 24 hour tablet Take 1 tablet every night   levETIRAcetam 100 MG/ML solution Commonly known as: KEPPRA Take 5mL (500mg ) twice a day   lisinopril 10 MG tablet Commonly known as: ZESTRIL Take 1 tablet (10 mg total) by mouth daily.   loperamide 2 MG capsule Commonly known as: IMODIUM Take 2 mg by  mouth daily. May repeat if needed   MELATONIN PO Take by mouth.   Oxtellar XR 600 MG Tb24 Generic drug: OXcarbazepine ER Take 1 tablet every night   potassium chloride 20 MEQ/15ML (10%) Soln TAKE 15 MLS (20 MEQ TOTAL) BY MOUTH DAILY.   pravastatin 40 MG tablet Commonly known as: PRAVACHOL Take 1 tablet (40 mg total) by mouth daily.   pseudoephedrine 120 MG 12 hr tablet Commonly known as: SUDAFED Take 120 mg by mouth every 12 (twelve) hours as needed for congestion.   sildenafil 20 MG tablet Commonly known as: REVATIO Take 1-5 tablets (20-100 mg total) by mouth as needed. Started by: Elige Radon Cohan Stipes, MD         Objective:   BP 124/79   Pulse 87   Ht 5\' 8"  (1.727 m)   Wt 215 lb (97.5 kg)   SpO2 97%   BMI 32.69  kg/m   Wt Readings from Last 3 Encounters:  09/07/21 215 lb (97.5 kg)  07/27/21 219 lb (99.3 kg)  06/29/21 216 lb (98 kg)    Physical Exam Vitals and nursing note reviewed.  Constitutional:      Appearance: Normal appearance.  Skin:    Findings: No rash.  Neurological:     Mental Status: He is alert and oriented to person, place, and time.  Psychiatric:        Mood and Affect: Mood normal.        Behavior: Behavior normal.      Assessment & Plan:   Problem List Items Addressed This Visit   None Visit Diagnoses     Erectile dysfunction, unspecified erectile dysfunction type    -  Primary        Follow up plan: Return if symptoms worsen or fail to improve.  Counseling provided for all of the vaccine components No orders of the defined types were placed in this encounter.   Arville Care, MD [facility] 09/07/2021, 2:35 PM

## 2021-09-26 ENCOUNTER — Encounter (HOSPITAL_COMMUNITY)
Admission: RE | Disposition: A | Discharge status: Home / Self Care | Payer: Self-pay | Source: Home / Self Care | Attending: Gastroenterology

## 2021-09-26 ENCOUNTER — Other Ambulatory Visit: Payer: Self-pay

## 2021-09-26 ENCOUNTER — Encounter (HOSPITAL_COMMUNITY): Payer: Self-pay | Admitting: Gastroenterology

## 2021-09-26 ENCOUNTER — Ambulatory Visit (HOSPITAL_COMMUNITY)
Admission: RE | Admit: 2021-09-26 | Discharge: 2021-09-26 | Disposition: A | Discharge status: Home / Self Care | Payer: BLUE CROSS/BLUE SHIELD | Attending: Gastroenterology | Admitting: Gastroenterology

## 2021-09-26 ENCOUNTER — Ambulatory Visit (HOSPITAL_COMMUNITY): Payer: BLUE CROSS/BLUE SHIELD | Admitting: Anesthesiology

## 2021-09-26 DIAGNOSIS — Z4802 Encounter for removal of sutures: Secondary | ICD-10-CM | POA: Diagnosis not present

## 2021-09-26 DIAGNOSIS — K5730 Diverticulosis of large intestine without perforation or abscess without bleeding: Secondary | ICD-10-CM | POA: Diagnosis not present

## 2021-09-26 DIAGNOSIS — K641 Second degree hemorrhoids: Secondary | ICD-10-CM | POA: Diagnosis not present

## 2021-09-26 DIAGNOSIS — K635 Polyp of colon: Secondary | ICD-10-CM | POA: Diagnosis not present

## 2021-09-26 DIAGNOSIS — Z980 Intestinal bypass and anastomosis status: Secondary | ICD-10-CM | POA: Insufficient documentation

## 2021-09-26 DIAGNOSIS — K644 Residual hemorrhoidal skin tags: Secondary | ICD-10-CM | POA: Insufficient documentation

## 2021-09-26 DIAGNOSIS — Z1211 Encounter for screening for malignant neoplasm of colon: Secondary | ICD-10-CM | POA: Diagnosis not present

## 2021-09-26 DIAGNOSIS — Z85038 Personal history of other malignant neoplasm of large intestine: Secondary | ICD-10-CM | POA: Diagnosis not present

## 2021-09-26 DIAGNOSIS — D123 Benign neoplasm of transverse colon: Secondary | ICD-10-CM

## 2021-09-26 DIAGNOSIS — Z87891 Personal history of nicotine dependence: Secondary | ICD-10-CM | POA: Insufficient documentation

## 2021-09-26 DIAGNOSIS — R1310 Dysphagia, unspecified: Secondary | ICD-10-CM

## 2021-09-26 HISTORY — PX: POLYPECTOMY: SHX5525

## 2021-09-26 HISTORY — PX: COLONOSCOPY WITH PROPOFOL: SHX5780

## 2021-09-26 HISTORY — PX: FOREIGN BODY REMOVAL: SHX962

## 2021-09-26 SURGERY — COLONOSCOPY WITH PROPOFOL
Anesthesia: Monitor Anesthesia Care

## 2021-09-26 MED ORDER — FLEET ENEMA 7-19 GM/118ML RE ENEM
ENEMA | RECTAL | Status: AC
  Filled 2021-09-26: qty 1

## 2021-09-26 MED ORDER — PROPOFOL 500 MG/50ML IV EMUL
INTRAVENOUS | Status: DC | PRN
Start: 2021-09-26 — End: 2021-09-26
  Administered 2021-09-26: 125 ug/kg/min via INTRAVENOUS

## 2021-09-26 MED ORDER — PHENYLEPHRINE HCL (PRESSORS) 10 MG/ML IV SOLN
INTRAVENOUS | Status: AC
  Filled 2021-09-26: qty 2

## 2021-09-26 MED ORDER — PHENYLEPHRINE HCL-NACL 20-0.9 MG/250ML-% IV SOLN
INTRAVENOUS | Status: DC | PRN
  Administered 2021-09-26: 100 ug/min via INTRAVENOUS

## 2021-09-26 MED ORDER — LACTATED RINGERS IV SOLN: INTRAVENOUS | Status: DC

## 2021-09-26 MED ORDER — PHENYLEPHRINE 40 MCG/ML (10ML) SYRINGE FOR IV PUSH (FOR BLOOD PRESSURE SUPPORT)
PREFILLED_SYRINGE | INTRAVENOUS | Status: DC | PRN
  Administered 2021-09-26: 120 ug via INTRAVENOUS
  Administered 2021-09-26: 80 ug via INTRAVENOUS
  Administered 2021-09-26: 120 ug via INTRAVENOUS

## 2021-09-26 MED ORDER — PROPOFOL 10 MG/ML IV BOLUS
INTRAVENOUS | Status: DC | PRN
  Administered 2021-09-26: 30 mg via INTRAVENOUS

## 2021-09-26 MED ORDER — SODIUM CHLORIDE 0.9 % IV SOLN: INTRAVENOUS | Status: DC

## 2021-09-26 MED ORDER — ALBUMIN HUMAN 5 % IV SOLN
INTRAVENOUS | Status: AC
  Filled 2021-09-26: qty 250

## 2021-09-26 MED ORDER — FLEET ENEMA 7-19 GM/118ML RE ENEM
1.0000 | ENEMA | Freq: Once | RECTAL | Status: AC
  Administered 2021-09-26: 10:00:00 1 via RECTAL

## 2021-09-26 MED ORDER — LIDOCAINE HCL 1 % IJ SOLN
INTRAMUSCULAR | Status: DC | PRN
  Administered 2021-09-26: 60 mg via INTRADERMAL

## 2021-09-26 MED ORDER — PROPOFOL 500 MG/50ML IV EMUL
INTRAVENOUS | Status: AC
  Filled 2021-09-26: qty 50

## 2021-09-26 MED ORDER — ALBUMIN HUMAN 5 % IV SOLN: INTRAVENOUS | Status: DC | PRN

## 2021-09-26 SURGICAL SUPPLY — 24 items
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
## 2021-09-26 NOTE — Op Note (Signed)
[facility] Patient Name: Carlos Miranda Procedure Date: 09/26/2021 MRN: [id number] Attending MD: Justice Britain , MD Date of Birth: [birth year] CSN: [id number] Age: 60 Admit Type: Outpatient Procedure:                Colonoscopy Indications:              High risk colon cancer surveillance: Personal                            history of colon cancer Providers:                Justice Britain, MD, Burtis Junes, RN, [facility]                            Technician, Technician Referring MD:             Derek Jack, MD, Leighton Ruff, MD, Fransisca Kaufmann Dettinger Medicines:                Monitored Anesthesia Care Complications:            No immediate complications. Estimated Blood Loss:     Estimated blood loss was minimal. Procedure:                Pre-Anesthesia Assessment:                           - Prior to the procedure, a History and Physical                            was performed, and patient medications and                            allergies were reviewed. The patient's tolerance of                            previous anesthesia was also reviewed. The risks                            and benefits of the procedure and the sedation                            options and risks were discussed with the patient.                            All questions were answered, and informed consent                            was obtained. Prior Anticoagulants: The patient has                            taken no previous anticoagulant or antiplatelet                            agents  except for aspirin. ASA Grade Assessment:                            III - A patient with severe systemic disease. After                            reviewing the risks and benefits, the patient was                            deemed in satisfactory condition to undergo the                            procedure.                           After obtaining informed  consent, the colonoscope                            was passed under direct vision. Throughout the                            procedure, the patient's blood pressure, pulse, and                            oxygen saturations were monitored continuously. The                            CF-HQ190L ([id number]) Olympus colonoscope was                            introduced through the anus and advanced to the the                            ileocolonic anastomosis. The colonoscopy was                            performed without difficulty. The patient tolerated                            the procedure. The quality of the bowel preparation                            was adequate. Scope In: 11:22:15 AM Scope Out: 11:43:52 AM Scope Withdrawal Time: 0 hours 15 minutes 7 seconds  Total Procedure Duration: 0 hours 21 minutes 37 seconds  Findings:      The digital rectal exam findings include hemorrhoids. Pertinent       negatives include no palpable rectal lesions.      Copious quantities of semi-liquid stool was found in the entire colon,       interfering with visualization. Suction via endoscope was performed,       resulting in clearance with adequate visualization (>1.5L removed during       insertion of colonoscope).      There was evidence of a prior functional end-to-end ileo-colonic       anastomosis at  the hepatic flexure. This was patent and was       characterized by healthy appearing mucosa and an intact staple line. The       anastomosis was traversed. Removal of two staples was accomplished with       a regular forceps.      A 7 mm polyp was found in the proximal transverse colon. The polyp was       sessile. The polyp was removed with a cold snare. Resection and       retrieval were complete.      Multiple small-mouthed diverticula were found in the recto-sigmoid       colon, sigmoid colon and descending colon.      Normal mucosa was found in the entire colon otherwise.       Non-bleeding non-thrombosed external and internal hemorrhoids were found       during retroflexion, during perianal exam and during digital exam. The       hemorrhoids were Grade II (internal hemorrhoids that prolapse but reduce       spontaneously). Impression:               - Hemorrhoids found on digital rectal exam.                           - Stool in the entire examined colon - suctioned                            via endoscope (>1.5L on insertion).                           - Patent functional end-to-end ileo-colonic                            anastomosis, characterized by healthy appearing                            mucosa and two intact staples - removed.                           - One 7 mm polyp in the proximal transverse colon,                            removed with a cold snare. Resected and retrieved.                           - Diverticulosis in the recto-sigmoid colon, in the                            sigmoid colon and in the descending colon.                           - Normal mucosa in the entire examined colon                            otherwise.                           -  Non-bleeding non-thrombosed external and internal                            hemorrhoids. Moderate Sedation:      Not Applicable - Patient had care per Anesthesia. Recommendation:           - The patient will be observed post-procedure,                            until all discharge criteria are met.                           - Discharge patient to home.                           - Patient has a contact number available for                            emergencies. The signs and symptoms of potential                            delayed complications were discussed with the                            patient. Return to normal activities tomorrow.                            Written discharge instructions were provided to the                            patient.                           - High fiber  diet.                           - Use FiberCon 1-2 tablets PO daily.                           - Continue present medications.                           - Await pathology results.                           - Repeat colonoscopy in 3 years for surveillance.                           - Recommend patient have procedure in hospital                            outpatient center as a result of hypotension that                            require Neosynephrine drip during procedure for  unclear etiology. Also recommend that patient have                            1.5 days - 2 days of preparation and be prepared                            for an enema as we removed nearly 1.5L of fluid                            during the insertion portion of the procedure.                           - The findings and recommendations were discussed                            with the patient.                           - The findings and recommendations were discussed                            with the patient's family. Procedure Code(s):        --- Professional ---                           [phone], Colonoscopy, flexible; with removal of                            tumor(s), polyp(s), or other lesion(s) by snare                            technique                           [phone], Colonoscopy, flexible; with removal of                            foreign body(s) Diagnosis Code(s):        --- Professional ---                           D17.616, Personal history of other malignant                            neoplasm of large intestine                           K64.1, Second degree hemorrhoids                           Z98.0, Intestinal bypass and anastomosis status                           K63.5, Polyp of colon  K57.30, Diverticulosis of large intestine without                            perforation or abscess without bleeding CPT copyright 2019 American Medical  Association. All rights reserved. The codes documented in this report are preliminary and upon coder review may  be revised to meet current compliance requirements. Justice Britain, MD 09/26/2021 11:59:44 AM Number of Addenda: 0

## 2021-09-26 NOTE — Anesthesia Preprocedure Evaluation (Addendum)
Anesthesia Evaluation  Patient identified by MRN, date of birth, ID band Patient awake    Reviewed: Allergy & Precautions, H&P , NPO status , Patient's Chart, lab work & pertinent test results  Airway Mallampati: III  TM Distance: >3 FB Neck ROM: Full    Dental no notable dental hx. (+) Teeth Intact, Dental Advisory Given   Pulmonary neg pulmonary ROS,    Pulmonary exam normal breath sounds clear to auscultation       Cardiovascular hypertension, Pt. on medications  Rhythm:Regular Rate:Normal     Neuro/Psych Seizures -, Well Controlled,  negative psych ROS   GI/Hepatic negative GI ROS, Neg liver ROS,   Endo/Other  negative endocrine ROS  Renal/GU negative Renal ROS  negative genitourinary   Musculoskeletal   Abdominal   Peds  Hematology  (+) Blood dyscrasia, anemia ,   Anesthesia Other Findings   Reproductive/Obstetrics negative OB ROS                            Anesthesia Physical Anesthesia Plan  ASA: 2  Anesthesia Plan: MAC   Post-op Pain Management: Minimal or no pain anticipated   Induction: Intravenous  PONV Risk Score and Plan: 1 and Propofol infusion  Airway Management Planned: Simple Face Mask  Additional Equipment:   Intra-op Plan:   Post-operative Plan:   Informed Consent: I have reviewed the patients History and Physical, chart, labs and discussed the procedure including the risks, benefits and alternatives for the proposed anesthesia with the patient or authorized representative who has indicated his/her understanding and acceptance.     Dental advisory given  Plan Discussed with: CRNA  Anesthesia Plan Comments:         Anesthesia Quick Evaluation

## 2021-09-26 NOTE — Progress Notes (Signed)
Pt arrived to recovery with 71/31 bp.  Dr fitzgerald notified and albumin was ordered and hung.  Pt is also on a phenylephrin drip to be titrated in recovery

## 2021-09-26 NOTE — Discharge Instructions (Signed)

## 2021-09-26 NOTE — Anesthesia Postprocedure Evaluation (Signed)
Anesthesia Post Note  Patient: Carlos Miranda  Procedure(s) Performed: COLONOSCOPY WITH PROPOFOL POLYPECTOMY FOREIGN BODY REMOVAL     Patient location during evaluation: Endoscopy Anesthesia Type: MAC Level of consciousness: awake and alert Pain management: pain level controlled Vital Signs Assessment: post-procedure vital signs reviewed and stable Respiratory status: spontaneous breathing, nonlabored ventilation and respiratory function stable Cardiovascular status: stable and blood pressure returned to baseline Postop Assessment: no apparent nausea or vomiting Anesthetic complications: no   No notable events documented.  Last Vitals:  Vitals:   09/26/21 1230 09/26/21 1236  BP: 136/73 117/60  Pulse: (!) 56 67  Resp: 17 16  Temp:    SpO2: 97% 99%    Last Pain:  Vitals:   09/26/21 1200  TempSrc:   PainSc: 0-No pain                 Kupono Marling,W. EDMOND

## 2021-09-26 NOTE — Progress Notes (Signed)
Patient has been evaluated post procedure. The patient has required a Neo-Synephrine drip during his procedure due to hypotension.  He is coming down on this slowly. He understands all of the findings on his colonoscopy. I have discussed this with the patient's father who is his point of care. We will continue to monitor him and slowly down titrate his Neo-Synephrine. He has received albumin as well. If he is unable to come off Neo-Synephrine safely he may require observation for further evaluation of hypotension. In either case, he likely will need to hold his lisinopril dose this evening. Time will tell.  He will be reassessed before final decision of discharge.  Justice Britain, MD [facility] Office # [phone]

## 2021-09-26 NOTE — Transfer of Care (Signed)
Immediate Anesthesia Transfer of Care Note  Patient: Carlos Miranda  Procedure(s) Performed: COLONOSCOPY WITH PROPOFOL POLYPECTOMY FOREIGN BODY REMOVAL  Patient Location: PACU and Endoscopy Unit  Anesthesia Type:MAC  Level of Consciousness: oriented, drowsy and patient cooperative  Airway & Oxygen Therapy: Patient Spontanous Breathing and Patient connected to face mask oxygen  Post-op Assessment: Report given to RN and Post -op Vital signs reviewed and stable  Post vital signs: Reviewed and stable  Last Vitals:  Vitals Value Taken Time  BP 113/47 09/26/21 1200  Temp 36.5 C 09/26/21 1153  Pulse 59 09/26/21 1201  Resp 19 09/26/21 1201  SpO2 99 % 09/26/21 1201  Vitals shown include unvalidated device data.  Last Pain:  Vitals:   09/26/21 1200  TempSrc:   PainSc: 0-No pain         Complications: No notable events documented.

## 2021-09-27 LAB — SURGICAL PATHOLOGY

## 2021-09-28 ENCOUNTER — Encounter (HOSPITAL_COMMUNITY): Payer: Self-pay | Admitting: Gastroenterology

## 2021-09-28 ENCOUNTER — Encounter: Payer: Self-pay | Admitting: Gastroenterology

## 2021-09-29 ENCOUNTER — Telehealth: Payer: Self-pay | Admitting: Family Medicine

## 2021-09-29 NOTE — Telephone Encounter (Signed)
Pt called requesting to speak with provider or nurse regarding possible side effect from taking his Viagra Rx with another one of his Rx's. Says pharmacy told him it was no concern but just wants to clarify with [place].

## 2021-09-29 NOTE — Telephone Encounter (Signed)
Prescribed 20mg  up to 5 in 24 hour period at last visit. Pt has epilepsy and takes Oxstellar XR 600mg .  Pt concerned about the Oxtellar decreasing the efficacy of Viagra.

## 2021-09-29 NOTE — Telephone Encounter (Signed)
Pt has been informed and understood.  He is aware to not take more than 100mg  of Viagra.  Pt states that his father has access to his mychart because he manages his neurology appts. Pt does not want dad to know about ED and Viagra. Informed pt that he needs to have a conversation with his father about how much he can view in his mychart.

## 2021-09-29 NOTE — Telephone Encounter (Signed)
Any antiepileptic medication can decrease or cause issues with erectile dysfunction but there is not necessarily an interaction that it makes Viagra worked last as much as it just affects erectile dysfunction.  If the Viagra was going to work for him it would work at the normal doses.  I would not go up any higher 100 mg is the max dose of Viagra.  According to the database when I look up there is no interaction between those 2 medicines

## 2021-10-14 ENCOUNTER — Ambulatory Visit (INDEPENDENT_AMBULATORY_CARE_PROVIDER_SITE_OTHER): Payer: BLUE CROSS/BLUE SHIELD | Admitting: Family Medicine

## 2021-10-14 ENCOUNTER — Encounter: Payer: Self-pay | Admitting: Family Medicine

## 2021-10-14 ENCOUNTER — Encounter (HOSPITAL_COMMUNITY): Payer: Self-pay | Admitting: Hematology

## 2021-10-14 VITALS — BP 125/84 | HR 94 | Temp 97.8°F | Ht 68.0 in | Wt 219.0 lb

## 2021-10-14 DIAGNOSIS — J0100 Acute maxillary sinusitis, unspecified: Secondary | ICD-10-CM

## 2021-10-14 MED ORDER — LEVOFLOXACIN 500 MG PO TABS: 500.0000 mg | ORAL_TABLET | Freq: Every day | ORAL | 0 refills | Status: DC

## 2021-10-14 NOTE — Progress Notes (Signed)
Subjective:  Patient ID: Carlos Miranda, male    DOB: [birth year]  Age: 60 y.o. MRN: [id number]  CC: sinus drainage   HPI Carlos Miranda presents for Symptoms include congestion, facial pain, nasal congestion, non productive cough, post nasal drip and sinus pressure. There is no fever, chills, or sweats. Onset of symptoms was a few days ago, gradually worsening since that time.   Having E.D. worried about use of sildenafil  Depression screen [facility] 2/9 10/14/2021 09/07/2021 09/07/2021  Decreased Interest 0 0 0  Down, Depressed, Hopeless 0 0 0  PHQ - 2 Score 0 0 0  Altered sleeping - 1 -  Tired, decreased energy - 1 -  Change in appetite - 0 -  Feeling bad or failure about yourself  - 0 -  Trouble concentrating - 0 -  Moving slowly or fidgety/restless - 0 -  Suicidal thoughts - 0 -  PHQ-9 Score - 2 -    History Carlos Miranda has a past medical history of Allergy, Cancer ([place]), Deafness in left ear, History of meningitis (6 months old), Hyperlipidemia, Hypertension, Port-A-Cath in place (01/24/2021), and Seizures ([place]).   Carlos Miranda has a past surgical history that includes Wisdom tooth extraction; Exploratory laparotomy; Colonoscopy; Laparoscopic partial colectomy (N/A, 11/10/2020); Portacath placement (Left, 12/29/2020); Colonoscopy with propofol (N/A, 09/26/2021); polypectomy (09/26/2021); and Foreign Body Removal (09/26/2021).   His family history includes Arthritis in his father; Hypertension in his father; Lupus in his mother; Multiple sclerosis in his brother and maternal uncle.Carlos Miranda reports that Carlos Miranda has never smoked. Carlos Miranda quit smokeless tobacco use about 2 years ago.  His smokeless tobacco use included chew. Carlos Miranda reports that Carlos Miranda does not currently use alcohol. Carlos Miranda reports that Carlos Miranda does not use drugs.    ROS Review of Systems  Constitutional:  Negative for activity change, appetite change, chills and fever.  HENT:  Positive for congestion, postnasal drip, rhinorrhea and sinus pressure. Negative for ear  discharge, ear pain, hearing loss, nosebleeds, sneezing and trouble swallowing.   Respiratory:  Negative for chest tightness and shortness of breath.   Cardiovascular:  Negative for chest pain and palpitations.  Skin:  Negative for rash.   Objective:  BP 125/84    Pulse 94    Temp 97.8 F (36.6 C) (Temporal)    Ht 5\' 8"  (1.727 m)    Wt 219 lb (99.3 kg)    SpO2 97%    BMI 33.30 kg/m   BP Readings from Last 3 Encounters:  10/14/21 125/84  09/26/21 117/60  09/07/21 124/79    Wt Readings from Last 3 Encounters:  10/14/21 219 lb (99.3 kg)  09/26/21 220 lb (99.8 kg)  09/07/21 215 lb (97.5 kg)     Physical Exam Constitutional:      Appearance: Carlos Miranda is well-developed.  HENT:     Head: Normocephalic and atraumatic.     Right Ear: Tympanic membrane and external ear normal. No decreased hearing noted.     Left Ear: Tympanic membrane and external ear normal. No decreased hearing noted.     Nose: Mucosal edema present.     Right Sinus: No frontal sinus tenderness.     Left Sinus: No frontal sinus tenderness.     Mouth/Throat:     Pharynx: No oropharyngeal exudate or posterior oropharyngeal erythema.  Neck:     Meningeal: Brudzinski's sign absent.  Pulmonary:     Effort: No respiratory distress.     Breath sounds: Normal breath sounds.  Lymphadenopathy:  Head:     Right side of head: No preauricular adenopathy.     Left side of head: No preauricular adenopathy.     Cervical:     Right cervical: No superficial cervical adenopathy.    Left cervical: No superficial cervical adenopathy.      Assessment & Plan:   Carlos Miranda was seen today for sinus drainage.  Diagnoses and all orders for this visit:  Acute maxillary sinusitis, recurrence not specified  Other orders -     levofloxacin (LEVAQUIN) 500 MG tablet; Take 1 tablet (500 mg total) by mouth daily. For 10 days       I am having Carlos Miranda. Carlos Miranda" start on levofloxacin. I am also having Carlos Miranda maintain his  pseudoephedrine, loperamide, lisinopril, pravastatin, MELATONIN PO, LamoTRIgine, levETIRAcetam, Oxtellar XR, potassium chloride, and sildenafil.  Allergies as of 10/14/2021       Reactions   Penicillins Swelling   Has patient had a PCN reaction causing    Furosemide Other (See Comments)   Advised to avoid this medication due to condition from childhood (Meninigitis)   Streptomycin Other (See Comments)   Avoid streptomycin, neomycin, and kanamycin due to deafness in one ear from meninigitis   Sulfa Antibiotics Other (See Comments)   Unknown reaction        Medication List        Accurate as of October 14, 2021 11:59 PM. If you have any questions, ask your nurse or doctor.          LamoTRIgine 300 MG Tb24 24 hour tablet Take 1 tablet every night   levETIRAcetam 100 MG/ML solution Commonly known as: KEPPRA Take 44mL (500mg ) twice a day   levofloxacin 500 MG tablet Commonly known as: LEVAQUIN Take 1 tablet (500 mg total) by mouth daily. For 10 days Started by: Claretta Fraise, MD   lisinopril 10 MG tablet Commonly known as: ZESTRIL Take 1 tablet (10 mg total) by mouth daily.   loperamide 2 MG capsule Commonly known as: IMODIUM Take 2 mg by mouth daily. May repeat if needed   MELATONIN PO Take by mouth.   Oxtellar XR 600 MG Tb24 Generic drug: OXcarbazepine ER Take 1 tablet every night   potassium chloride 20 MEQ/15ML (10%) Soln TAKE 15 MLS (20 MEQ TOTAL) BY MOUTH DAILY.   pravastatin 40 MG tablet Commonly known as: PRAVACHOL Take 1 tablet (40 mg total) by mouth daily.   pseudoephedrine 120 MG 12 hr tablet Commonly known as: SUDAFED Take 120 mg by mouth every 12 (twelve) hours as needed for congestion.   sildenafil 20 MG tablet Commonly known as: REVATIO Take 1-5 tablets (20-100 mg total) by mouth as needed.         Follow-up: Return if symptoms worsen or fail to improve.  Claretta Fraise, M.D.

## 2021-10-16 ENCOUNTER — Encounter: Payer: Self-pay | Admitting: Family Medicine

## 2021-10-18 ENCOUNTER — Inpatient Hospital Stay (HOSPITAL_COMMUNITY): Payer: BLUE CROSS/BLUE SHIELD | Attending: Hematology

## 2021-10-18 ENCOUNTER — Other Ambulatory Visit: Payer: Self-pay

## 2021-10-18 DIAGNOSIS — C182 Malignant neoplasm of ascending colon: Secondary | ICD-10-CM | POA: Diagnosis not present

## 2021-10-18 DIAGNOSIS — Z95828 Presence of other vascular implants and grafts: Secondary | ICD-10-CM

## 2021-10-18 LAB — CBC WITH DIFFERENTIAL/PLATELET
Abs Immature Granulocytes: 0.01 10*3/uL (ref 0.00–0.07)
Basophils Absolute: 0 10*3/uL (ref 0.0–0.1)
Basophils Relative: 1 %
Eosinophils Absolute: 0.3 10*3/uL (ref 0.0–0.5)
Eosinophils Relative: 6 %
HCT: 48.2 % (ref 39.0–52.0)
Hemoglobin: 16.2 g/dL (ref 13.0–17.0)
Immature Granulocytes: 0 %
Lymphocytes Relative: 28 %
Lymphs Abs: 1.2 10*3/uL (ref 0.7–4.0)
MCH: 32.8 pg (ref 26.0–34.0)
MCHC: 33.6 g/dL (ref 30.0–36.0)
MCV: 97.6 fL (ref 80.0–100.0)
Monocytes Absolute: 0.3 10*3/uL (ref 0.1–1.0)
Monocytes Relative: 6 %
Neutro Abs: 2.5 10*3/uL (ref 1.7–7.7)
Neutrophils Relative %: 59 %
Platelets: 168 10*3/uL (ref 150–400)
RBC: 4.94 MIL/uL (ref 4.22–5.81)
RDW: 12.3 % (ref 11.5–15.5)
WBC: 4.4 10*3/uL (ref 4.0–10.5)
nRBC: 0 % (ref 0.0–0.2)

## 2021-10-18 LAB — COMPREHENSIVE METABOLIC PANEL
ALT: 29 U/L (ref 0–44)
AST: 25 U/L (ref 15–41)
Albumin: 4.1 g/dL (ref 3.5–5.0)
Alkaline Phosphatase: 118 U/L (ref 38–126)
Anion gap: 8 (ref 5–15)
BUN: 14 mg/dL (ref 6–20)
CO2: 25 mmol/L (ref 22–32)
Calcium: 8.9 mg/dL (ref 8.9–10.3)
Chloride: 104 mmol/L (ref 98–111)
Creatinine, Ser: 0.86 mg/dL (ref 0.61–1.24)
GFR, Estimated: >60 mL/min (ref >60)
Glucose, Bld: 137 mg/dL — ABNORMAL HIGH (ref 70–99)
Potassium: 3.3 mmol/L — ABNORMAL LOW (ref 3.5–5.1)
Sodium: 137 mmol/L (ref 135–145)
Total Bilirubin: 0.7 mg/dL (ref 0.3–1.2)
Total Protein: 6.9 g/dL (ref 6.5–8.1)

## 2021-10-18 LAB — MAGNESIUM: Magnesium: 2.1 mg/dL (ref 1.7–2.4)

## 2021-10-18 MED ORDER — SODIUM CHLORIDE 0.9% FLUSH
10.0000 mL | Freq: Once | INTRAVENOUS | Status: AC
  Administered 2021-10-18: 09:00:00 10 mL via INTRAVENOUS

## 2021-10-18 MED ORDER — HEPARIN SOD (PORK) LOCK FLUSH 100 UNIT/ML IV SOLN
500.0000 [IU] | Freq: Once | INTRAVENOUS | Status: AC
  Administered 2021-10-18: 09:00:00 500 [IU] via INTRAVENOUS

## 2021-10-18 NOTE — Patient Instructions (Signed)
[facility]  Discharge Instructions: Thank you for choosing [facility] to provide your oncology and hematology care.  If you have a lab appointment with the Cancer Center, please come in thru the Main Entrance and check in at the main information desk.  Wear comfortable clothing and clothing appropriate for easy access to any Portacath or PICC line.   We strive to give you quality time with your provider. You may need to reschedule your appointment if you arrive late (15 or more minutes).  Arriving late affects you and other patients whose appointments are after yours.  Also, if you miss three or more appointments without notifying the office, you may be dismissed from the clinic at the provider's discretion.      For prescription refill requests, have your pharmacy contact our office and allow 72 hours for refills to be completed.    Today you received the following chemotherapy and/or immunotherapy agents PORT flush labs      To help prevent nausea and vomiting after your treatment, we encourage you to take your nausea medication as directed.  BELOW ARE SYMPTOMS THAT SHOULD BE REPORTED IMMEDIATELY: *FEVER GREATER THAN 100.4 F (38 C) OR HIGHER *CHILLS OR SWEATING *NAUSEA AND VOMITING THAT IS NOT CONTROLLED WITH YOUR NAUSEA MEDICATION *UNUSUAL SHORTNESS OF BREATH *UNUSUAL BRUISING OR BLEEDING *URINARY PROBLEMS (pain or burning when urinating, or frequent urination) *BOWEL PROBLEMS (unusual diarrhea, constipation, pain near the anus) TENDERNESS IN MOUTH AND THROAT WITH OR WITHOUT PRESENCE OF ULCERS (sore throat, sores in mouth, or a toothache) UNUSUAL RASH, SWELLING OR PAIN  UNUSUAL VAGINAL DISCHARGE OR ITCHING   Items with * indicate a potential emergency and should be followed up as soon as possible or go to the Emergency Department if any problems should occur.  Please show the CHEMOTHERAPY ALERT CARD or IMMUNOTHERAPY ALERT CARD at check-in to the Emergency  Department and triage nurse.  Should you have questions after your visit or need to cancel or reschedule your appointment, please contact [facility] [phone]  and follow the prompts.  Office hours are 8:00 a.m. to 4:30 p.m. Monday - Friday. Please note that voicemails left after 4:00 p.m. may not be returned until the following business day.  We are closed weekends and major holidays. You have access to a nurse at all times for urgent questions. Please call the main number to the clinic [phone] and follow the prompts.  For any non-urgent questions, you may also contact your provider using MyChart. We now offer e-Visits for anyone 18 and older to request care online for non-urgent symptoms. For details visit [email or web address].   Also download the MyChart app! Go to the app store, search "MyChart", open the app, select [facility], and log in with your MyChart username and password.  Due to Covid, a mask is required upon entering the hospital/clinic. If you do not have a mask, one will be given to you upon arrival. For doctor visits, patients may have 1 support person aged 18 or older with them. For treatment visits, patients cannot have anyone with them due to current Covid guidelines and our immunocompromised population.  

## 2021-10-18 NOTE — Progress Notes (Signed)
Patients port flushed without difficulty.  Good blood return noted with no bruising or swelling noted at site.  Stable during access and blood draw.  Band aid applied.  VSS with discharge and left in satisfactory condition with no s/s of distress noted.   ?

## 2021-10-19 ENCOUNTER — Other Ambulatory Visit (HOSPITAL_COMMUNITY): Payer: BLUE CROSS/BLUE SHIELD

## 2021-10-20 ENCOUNTER — Other Ambulatory Visit (HOSPITAL_COMMUNITY): Payer: Self-pay | Admitting: Hematology

## 2021-10-26 ENCOUNTER — Ambulatory Visit (HOSPITAL_COMMUNITY): Payer: BLUE CROSS/BLUE SHIELD | Admitting: Hematology

## 2021-10-28 ENCOUNTER — Ambulatory Visit (HOSPITAL_COMMUNITY)
Admission: RE | Admit: 2021-10-28 | Discharge: 2021-10-28 | Disposition: A | Discharge status: Home / Self Care | Payer: BLUE CROSS/BLUE SHIELD | Source: Ambulatory Visit | Attending: Hematology | Admitting: Hematology

## 2021-10-28 ENCOUNTER — Other Ambulatory Visit: Payer: Self-pay

## 2021-10-28 DIAGNOSIS — N400 Enlarged prostate without lower urinary tract symptoms: Secondary | ICD-10-CM | POA: Diagnosis not present

## 2021-10-28 DIAGNOSIS — C182 Malignant neoplasm of ascending colon: Secondary | ICD-10-CM | POA: Insufficient documentation

## 2021-10-28 DIAGNOSIS — K449 Diaphragmatic hernia without obstruction or gangrene: Secondary | ICD-10-CM | POA: Diagnosis not present

## 2021-10-28 DIAGNOSIS — K7689 Other specified diseases of liver: Secondary | ICD-10-CM | POA: Diagnosis not present

## 2021-10-28 DIAGNOSIS — C189 Malignant neoplasm of colon, unspecified: Secondary | ICD-10-CM | POA: Diagnosis not present

## 2021-10-28 MED ORDER — HEPARIN SOD (PORK) LOCK FLUSH 100 UNIT/ML IV SOLN
INTRAVENOUS | Status: AC
  Filled 2021-10-28: qty 5

## 2021-10-28 MED ORDER — HEPARIN SOD (PORK) LOCK FLUSH 100 UNIT/ML IV SOLN
500.0000 [IU] | Freq: Once | INTRAVENOUS | Status: AC
  Administered 2021-10-28: 16:00:00 500 [IU] via INTRAVENOUS

## 2021-10-28 MED ORDER — IOHEXOL 300 MG/ML  SOLN
100.0000 mL | Freq: Once | INTRAMUSCULAR | Status: AC | PRN
  Administered 2021-10-28: 16:00:00 100 mL via INTRAVENOUS
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
## 2021-11-02 ENCOUNTER — Inpatient Hospital Stay (HOSPITAL_COMMUNITY): Payer: BLUE CROSS/BLUE SHIELD | Attending: Hematology | Admitting: Hematology

## 2021-11-02 ENCOUNTER — Other Ambulatory Visit: Payer: Self-pay

## 2021-11-02 DIAGNOSIS — E785 Hyperlipidemia, unspecified: Secondary | ICD-10-CM | POA: Insufficient documentation

## 2021-11-02 DIAGNOSIS — G629 Polyneuropathy, unspecified: Secondary | ICD-10-CM | POA: Insufficient documentation

## 2021-11-02 DIAGNOSIS — K7689 Other specified diseases of liver: Secondary | ICD-10-CM | POA: Diagnosis not present

## 2021-11-02 DIAGNOSIS — Z79899 Other long term (current) drug therapy: Secondary | ICD-10-CM | POA: Diagnosis not present

## 2021-11-02 DIAGNOSIS — N400 Enlarged prostate without lower urinary tract symptoms: Secondary | ICD-10-CM | POA: Insufficient documentation

## 2021-11-02 DIAGNOSIS — Z8269 Family history of other diseases of the musculoskeletal system and connective tissue: Secondary | ICD-10-CM | POA: Diagnosis not present

## 2021-11-02 DIAGNOSIS — I10 Essential (primary) hypertension: Secondary | ICD-10-CM | POA: Diagnosis not present

## 2021-11-02 DIAGNOSIS — R590 Localized enlarged lymph nodes: Secondary | ICD-10-CM | POA: Diagnosis not present

## 2021-11-02 DIAGNOSIS — Z882 Allergy status to sulfonamides status: Secondary | ICD-10-CM | POA: Diagnosis not present

## 2021-11-02 DIAGNOSIS — Z87891 Personal history of nicotine dependence: Secondary | ICD-10-CM | POA: Insufficient documentation

## 2021-11-02 DIAGNOSIS — Z9049 Acquired absence of other specified parts of digestive tract: Secondary | ICD-10-CM | POA: Insufficient documentation

## 2021-11-02 DIAGNOSIS — Z8249 Family history of ischemic heart disease and other diseases of the circulatory system: Secondary | ICD-10-CM | POA: Diagnosis not present

## 2021-11-02 DIAGNOSIS — E876 Hypokalemia: Secondary | ICD-10-CM | POA: Diagnosis not present

## 2021-11-02 DIAGNOSIS — Z803 Family history of malignant neoplasm of breast: Secondary | ICD-10-CM | POA: Diagnosis not present

## 2021-11-02 DIAGNOSIS — Z880 Allergy status to penicillin: Secondary | ICD-10-CM | POA: Diagnosis not present

## 2021-11-02 DIAGNOSIS — R197 Diarrhea, unspecified: Secondary | ICD-10-CM | POA: Diagnosis not present

## 2021-11-02 DIAGNOSIS — C182 Malignant neoplasm of ascending colon: Secondary | ICD-10-CM | POA: Insufficient documentation

## 2021-11-02 DIAGNOSIS — Z888 Allergy status to other drugs, medicaments and biological substances status: Secondary | ICD-10-CM | POA: Diagnosis not present

## 2021-11-02 DIAGNOSIS — R269 Unspecified abnormalities of gait and mobility: Secondary | ICD-10-CM | POA: Diagnosis not present

## 2021-11-02 DIAGNOSIS — K449 Diaphragmatic hernia without obstruction or gangrene: Secondary | ICD-10-CM | POA: Diagnosis not present

## 2021-11-02 DIAGNOSIS — Z8261 Family history of arthritis: Secondary | ICD-10-CM | POA: Insufficient documentation

## 2021-11-02 DIAGNOSIS — R569 Unspecified convulsions: Secondary | ICD-10-CM | POA: Diagnosis not present

## 2021-11-02 DIAGNOSIS — R918 Other nonspecific abnormal finding of lung field: Secondary | ICD-10-CM | POA: Insufficient documentation

## 2021-11-02 MED ORDER — GABAPENTIN 300 MG PO CAPS: 300.0000 mg | ORAL_CAPSULE | Freq: Two times a day (BID) | ORAL | 2 refills | Status: DC

## 2021-11-02 NOTE — Patient Instructions (Signed)
[place] at [facility] Discharge Instructions  You were seen and examined today by Dr. Delton Coombes. He reviewed your most recent labs and scan everything looks good. He sent Gabapentin into the pharmacy for you to pick up. Please keep follow up appointment as scheduled in 3 months.   Thank you for choosing [place] at [facility] to provide your oncology and hematology care.  To afford each patient quality time with our provider, please arrive at least 15 minutes before your scheduled appointment time.   If you have a lab appointment with the [place] please come in thru the Main Entrance and check in at the main information desk.  You need to re-schedule your appointment should you arrive 10 or more minutes late.  We strive to give you quality time with our providers, and arriving late affects you and other patients whose appointments are after yours.  Also, if you no show three or more times for appointments you may be dismissed from the clinic at the providers discretion.     Again, thank you for choosing [facility].  Our hope is that these requests will decrease the amount of time that you wait before being seen by our physicians.       _____________________________________________________________  Should you have questions after your visit to [facility], please contact our office at [phone] and follow the prompts.  Our office hours are 8:00 a.m. and 4:30 p.m. Monday - Friday.  Please note that voicemails left after 4:00 p.m. may not be returned until the following business day.  We are closed weekends and major holidays.  You do have access to a nurse 24-7, just call the main number to the clinic [phone] and do not press any options, hold on the line and a nurse will answer the phone.    For prescription refill requests, have your pharmacy contact our office and allow 72 hours.    Due to Covid, you  will need to wear a mask upon entering the hospital. If you do not have a mask, a mask will be given to you at the Main Entrance upon arrival. For doctor visits, patients may have 1 support person age 22 or older with them. For treatment visits, patients can not have anyone with them due to social distancing guidelines and our immunocompromised population.

## 2021-11-29 ENCOUNTER — Encounter (HOSPITAL_COMMUNITY): Payer: Self-pay | Admitting: Hematology

## 2021-11-29 ENCOUNTER — Ambulatory Visit (INDEPENDENT_AMBULATORY_CARE_PROVIDER_SITE_OTHER): Payer: BLUE CROSS/BLUE SHIELD | Admitting: Neurology

## 2021-11-29 ENCOUNTER — Other Ambulatory Visit: Payer: Self-pay

## 2021-11-29 ENCOUNTER — Encounter: Payer: Self-pay | Admitting: Neurology

## 2021-11-29 VITALS — BP 116/78 | HR 90 | Ht 68.0 in | Wt 221.4 lb

## 2021-11-29 DIAGNOSIS — G40219 Localization-related (focal) (partial) symptomatic epilepsy and epileptic syndromes with complex partial seizures, intractable, without status epilepticus: Secondary | ICD-10-CM

## 2021-11-29 MED ORDER — OXCARBAZEPINE ER 300 MG PO TB24: ORAL_TABLET | ORAL | 11 refills | Status: DC

## 2021-11-29 MED ORDER — LAMOTRIGINE ER 300 MG PO TB24: ORAL_TABLET | ORAL | 3 refills | Status: DC

## 2021-11-29 MED ORDER — OXTELLAR XR 600 MG PO TB24: ORAL_TABLET | ORAL | 11 refills | Status: DC

## 2021-11-29 MED ORDER — LEVETIRACETAM 100 MG/ML PO SOLN
ORAL | 11 refills | Status: DC
Start: 2021-11-29 — End: 2022-03-28
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
## 2021-12-07 ENCOUNTER — Encounter (HOSPITAL_COMMUNITY): Payer: Self-pay | Admitting: Hematology

## 2021-12-07 ENCOUNTER — Other Ambulatory Visit: Payer: Self-pay

## 2021-12-07 ENCOUNTER — Ambulatory Visit (INDEPENDENT_AMBULATORY_CARE_PROVIDER_SITE_OTHER): Payer: BLUE CROSS/BLUE SHIELD | Admitting: Family Medicine

## 2021-12-07 ENCOUNTER — Encounter: Payer: Self-pay | Admitting: Family Medicine

## 2021-12-07 VITALS — BP 104/65 | HR 75 | Ht 68.0 in | Wt 220.0 lb

## 2021-12-07 DIAGNOSIS — I10 Essential (primary) hypertension: Secondary | ICD-10-CM | POA: Diagnosis not present

## 2021-12-07 DIAGNOSIS — E785 Hyperlipidemia, unspecified: Secondary | ICD-10-CM

## 2021-12-07 NOTE — Progress Notes (Signed)
BP 104/65    Pulse 75    Ht 5\' 8"  (1.727 m)    Wt 220 lb (99.8 kg)    SpO2 95%    BMI 33.45 kg/m    Subjective:   Patient ID: Carlos Miranda, male    DOB: [birth year], 61 y.o.   MRN: [id number]  HPI: Carlos Miranda is a 61 y.o. male presenting on 12/07/2021 for Medical Management of Chronic Issues   HPI Hypertension Patient is currently on lisinopril, and their blood pressure today is 104/65. Patient denies any lightheadedness or dizziness. Patient denies headaches, blurred vision, chest pains, shortness of breath, or weakness. Denies any side effects from medication and is content with current medication.   Hyperlipidemia Patient is coming in for recheck of his hyperlipidemia. The patient is currently taking pravastatin. They deny any issues with myalgias or history of liver damage from it. They deny any focal numbness or weakness or chest pain.   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  He tried gabapentin but it did not seem to help.  He does not really get a lot of pain with it but just more of the numbness and feeling like it is asleep.  Relevant past medical, surgical, family and social history reviewed and updated as indicated. Interim medical history since our last visit reviewed. Allergies and medications reviewed and updated.  Review of Systems  Constitutional:  Negative for chills and fever.  Eyes:  Negative for visual disturbance.  Respiratory:  Negative for shortness of breath and wheezing.   Cardiovascular:  Negative for chest pain and leg swelling.  Musculoskeletal:  Negative for back pain and gait problem.  Skin:  Negative for rash.  Neurological:  Negative for dizziness, weakness and light-headedness.  All other systems reviewed and are negative.  Per HPI unless specifically indicated above   Allergies as of 12/07/2021       Reactions   Penicillins Swelling   Has patient had a PCN reaction causing    Furosemide  Other (See Comments)   Advised to avoid this medication due to condition from childhood (Meninigitis)   Streptomycin Other (See Comments)   Avoid streptomycin, neomycin, and kanamycin due to deafness in one ear from meninigitis   Sulfa Antibiotics Other (See Comments)   Unknown reaction        Medication List        Accurate as of December 07, 2021  9:21 AM. If you have any questions, ask your nurse or doctor.          STOP taking these medications    gabapentin 300 MG capsule Commonly known as: NEURONTIN Stopped by: Nils Pyle, MD       TAKE these medications    LamoTRIgine 300 MG Tb24 24 hour tablet Take 1 tablet every night   levETIRAcetam 100 MG/ML solution Commonly known as: KEPPRA Take 5mL (500mg ) twice a day   lisinopril 10 MG tablet Commonly known as: ZESTRIL Take 1 tablet (10 mg total) by mouth daily.   MELATONIN PO Take by mouth.   Oxtellar XR 600 MG Tb24 Generic drug: OXcarbazepine ER Take 1 tablet every night (take with Oxtellar 300mg  tablet for total of 900mg  every night) What changed: Another medication with the same name was removed. Continue taking this medication, and follow the directions you see here. Changed by: Elige Radon Jewelz Ricklefs, MD   potassium chloride 20 MEQ/15ML (10%) Soln TAKE 15 MLS (20 MEQ TOTAL)  BY MOUTH DAILY.   pravastatin 40 MG tablet Commonly known as: PRAVACHOL Take 1 tablet (40 mg total) by mouth daily.   sildenafil 20 MG tablet Commonly known as: REVATIO Take 1-5 tablets (20-100 mg total) by mouth as needed.         Objective:   BP 104/65    Pulse 75    Ht 5\' 8"  (1.727 m)    Wt 220 lb (99.8 kg)    SpO2 95%    BMI 33.45 kg/m   Wt Readings from Last 3 Encounters:  12/07/21 220 lb (99.8 kg)  11/29/21 221 lb 6.4 oz (100.4 kg)  10/14/21 219 lb (99.3 kg)    Physical Exam Vitals and nursing note reviewed.  Constitutional:      General: He is not in acute distress.    Appearance: He is well-developed.  He is not diaphoretic.  Eyes:     General: No scleral icterus.    Conjunctiva/sclera: Conjunctivae normal.  Neck:     Thyroid: No thyromegaly.  Cardiovascular:     Rate and Rhythm: Normal rate and regular rhythm.     Heart sounds: Normal heart sounds. No murmur heard. Pulmonary:     Effort: Pulmonary effort is normal. No respiratory distress.     Breath sounds: Normal breath sounds. No wheezing.  Musculoskeletal:        General: No swelling. Normal range of motion.     Cervical back: Neck supple.  Lymphadenopathy:     Cervical: No cervical adenopathy.  Skin:    General: Skin is warm and dry.     Findings: No rash.  Neurological:     Mental Status: He is alert and oriented to person, place, and time.     Coordination: Coordination normal.  Psychiatric:        Behavior: Behavior normal.      Assessment & Plan:   Problem List Items Addressed This Visit       Cardiovascular and Mediastinum   HTN (hypertension) - Primary     Other   HLD (hyperlipidemia)   Relevant Orders   Lipid panel   Morbid obesity (HCC)    Continue current medicine, give a suggestion of possibly apolipoprotein 600 mg daily over-the-counter could help with his neuropathy.  He said the gabapentin did not help at all.  Follow up plan: Return in about 6 months (around 06/06/2022), or if symptoms worsen or fail to improve, for Physical exam and hypertension and cholesterol.  Counseling provided for all of the vaccine components Orders Placed This Encounter  Procedures   Lipid panel    Arville Care, MD [facility] 12/07/2021, 9:21 AM

## 2021-12-08 LAB — LIPID PANEL
Chol/HDL Ratio: 3.1 ratio (ref 0.0–5.0)
Cholesterol, Total: 163 mg/dL (ref 100–199)
HDL: 52 mg/dL (ref >39)
LDL Chol Calc (NIH): 90 mg/dL (ref 0–99)
Triglycerides: 115 mg/dL (ref 0–149)
VLDL Cholesterol Cal: 21 mg/dL (ref 5–40)

## 2022-02-07 ENCOUNTER — Inpatient Hospital Stay (HOSPITAL_COMMUNITY): Payer: BLUE CROSS/BLUE SHIELD | Attending: Hematology

## 2022-02-07 DIAGNOSIS — Z87891 Personal history of nicotine dependence: Secondary | ICD-10-CM | POA: Insufficient documentation

## 2022-02-07 DIAGNOSIS — R200 Anesthesia of skin: Secondary | ICD-10-CM | POA: Insufficient documentation

## 2022-02-07 DIAGNOSIS — R197 Diarrhea, unspecified: Secondary | ICD-10-CM | POA: Insufficient documentation

## 2022-02-07 DIAGNOSIS — R202 Paresthesia of skin: Secondary | ICD-10-CM | POA: Insufficient documentation

## 2022-02-07 DIAGNOSIS — E785 Hyperlipidemia, unspecified: Secondary | ICD-10-CM | POA: Insufficient documentation

## 2022-02-07 DIAGNOSIS — Z9049 Acquired absence of other specified parts of digestive tract: Secondary | ICD-10-CM | POA: Insufficient documentation

## 2022-02-07 DIAGNOSIS — C182 Malignant neoplasm of ascending colon: Secondary | ICD-10-CM | POA: Insufficient documentation

## 2022-02-07 DIAGNOSIS — Z882 Allergy status to sulfonamides status: Secondary | ICD-10-CM | POA: Insufficient documentation

## 2022-02-07 DIAGNOSIS — I10 Essential (primary) hypertension: Secondary | ICD-10-CM | POA: Insufficient documentation

## 2022-02-07 DIAGNOSIS — Z8269 Family history of other diseases of the musculoskeletal system and connective tissue: Secondary | ICD-10-CM | POA: Insufficient documentation

## 2022-02-07 DIAGNOSIS — E669 Obesity, unspecified: Secondary | ICD-10-CM | POA: Insufficient documentation

## 2022-02-07 DIAGNOSIS — Z79899 Other long term (current) drug therapy: Secondary | ICD-10-CM | POA: Insufficient documentation

## 2022-02-07 DIAGNOSIS — Z8249 Family history of ischemic heart disease and other diseases of the circulatory system: Secondary | ICD-10-CM | POA: Insufficient documentation

## 2022-02-07 DIAGNOSIS — Z888 Allergy status to other drugs, medicaments and biological substances status: Secondary | ICD-10-CM | POA: Insufficient documentation

## 2022-02-07 DIAGNOSIS — E876 Hypokalemia: Secondary | ICD-10-CM | POA: Insufficient documentation

## 2022-02-07 DIAGNOSIS — G629 Polyneuropathy, unspecified: Secondary | ICD-10-CM | POA: Insufficient documentation

## 2022-02-07 DIAGNOSIS — Z8261 Family history of arthritis: Secondary | ICD-10-CM | POA: Insufficient documentation

## 2022-02-07 DIAGNOSIS — Z880 Allergy status to penicillin: Secondary | ICD-10-CM | POA: Insufficient documentation

## 2022-02-07 LAB — CBC WITH DIFFERENTIAL/PLATELET
Abs Immature Granulocytes: 0.02 10*3/uL (ref 0.00–0.07)
Basophils Absolute: 0.1 10*3/uL (ref 0.0–0.1)
Basophils Relative: 1 %
Eosinophils Absolute: 0.3 10*3/uL (ref 0.0–0.5)
Eosinophils Relative: 6 %
HCT: 50 % (ref 39.0–52.0)
Hemoglobin: 17.2 g/dL — ABNORMAL HIGH (ref 13.0–17.0)
Immature Granulocytes: 0 %
Lymphocytes Relative: 27 %
Lymphs Abs: 1.3 10*3/uL (ref 0.7–4.0)
MCH: 32.4 pg (ref 26.0–34.0)
MCHC: 34.4 g/dL (ref 30.0–36.0)
MCV: 94.2 fL (ref 80.0–100.0)
Monocytes Absolute: 0.4 10*3/uL (ref 0.1–1.0)
Monocytes Relative: 8 %
Neutro Abs: 2.6 10*3/uL (ref 1.7–7.7)
Neutrophils Relative %: 58 %
Platelets: 201 10*3/uL (ref 150–400)
RBC: 5.31 MIL/uL (ref 4.22–5.81)
RDW: 12.7 % (ref 11.5–15.5)
WBC: 4.6 10*3/uL (ref 4.0–10.5)
nRBC: 0 % (ref 0.0–0.2)

## 2022-02-07 LAB — MAGNESIUM: Magnesium: 2.2 mg/dL (ref 1.7–2.4)

## 2022-02-07 LAB — COMPREHENSIVE METABOLIC PANEL
ALT: 29 U/L (ref 0–44)
AST: 24 U/L (ref 15–41)
Albumin: 4.4 g/dL (ref 3.5–5.0)
Alkaline Phosphatase: 131 U/L — ABNORMAL HIGH (ref 38–126)
Anion gap: 8 (ref 5–15)
BUN: 17 mg/dL (ref 6–20)
CO2: 23 mmol/L (ref 22–32)
Calcium: 9 mg/dL (ref 8.9–10.3)
Chloride: 107 mmol/L (ref 98–111)
Creatinine, Ser: 0.97 mg/dL (ref 0.61–1.24)
GFR, Estimated: >60 mL/min (ref >60)
Glucose, Bld: 113 mg/dL — ABNORMAL HIGH (ref 70–99)
Potassium: 3.8 mmol/L (ref 3.5–5.1)
Sodium: 138 mmol/L (ref 135–145)
Total Bilirubin: 0.6 mg/dL (ref 0.3–1.2)
Total Protein: 7.8 g/dL (ref 6.5–8.1)

## 2022-02-08 LAB — CEA: CEA: 1.4 ng/mL (ref 0.0–4.7)

## 2022-02-14 ENCOUNTER — Inpatient Hospital Stay (HOSPITAL_BASED_OUTPATIENT_CLINIC_OR_DEPARTMENT_OTHER): Payer: Self-pay | Admitting: Hematology

## 2022-02-14 ENCOUNTER — Encounter (HOSPITAL_COMMUNITY): Payer: Self-pay | Admitting: Hematology

## 2022-02-14 VITALS — BP 130/86 | HR 82 | Temp 98.2°F | Resp 20 | Ht 68.5 in | Wt 235.4 lb

## 2022-02-14 DIAGNOSIS — C182 Malignant neoplasm of ascending colon: Secondary | ICD-10-CM

## 2022-02-14 NOTE — Patient Instructions (Signed)
[place] at [facility] ?Discharge Instructions ? ?You were seen and examined today by Dr. Delton Coombes. ? ?Dr. Delton Coombes discussed your most recent lab work and everything looks good. ? ?Please keep follow-up as scheduled in 3 months.  ? ? ?Thank you for choosing [place] at [facility] to provide your oncology and hematology care.  To afford each patient quality time with our provider, please arrive at least 15 minutes before your scheduled appointment time.  ? ?If you have a lab appointment with the [facility] please come in thru the Main Entrance and check in at the main information desk. ? ?You need to re-schedule your appointment should you arrive 10 or more minutes late.  We strive to give you quality time with our providers, and arriving late affects you and other patients whose appointments are after yours.  Also, if you no show three or more times for appointments you may be dismissed from the clinic at the providers discretion.     ?Again, thank you for choosing [facility].  Our hope is that these requests will decrease the amount of time that you wait before being seen by our physicians.       ?_____________________________________________________________ ? ?Should you have questions after your visit to [facility], please contact our office at [phone] and follow the prompts.  Our office hours are 8:00 a.m. and 4:30 p.m. Monday - Friday.  Please note that voicemails left after 4:00 p.m. may not be returned until the following business day.  We are closed weekends and major holidays.  You do have access to a nurse 24-7, just call the main number to the clinic [phone] and do not press any options, hold on the line and a nurse will answer the phone.   ? ?For prescription refill requests, have your pharmacy contact our office and allow 72 hours.   ? ?Due to Covid, you will need to wear a mask upon entering the  hospital. If you do not have a mask, a mask will be given to you at the Main Entrance upon arrival. For doctor visits, patients may have 1 support person age 50 or older with them. For treatment visits, patients can not have anyone with them due to social distancing guidelines and our immunocompromised population.  ? ?  ?
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
## 2022-02-21 DIAGNOSIS — Z0271 Encounter for disability determination: Secondary | ICD-10-CM

## 2022-03-28 ENCOUNTER — Ambulatory Visit (INDEPENDENT_AMBULATORY_CARE_PROVIDER_SITE_OTHER): Payer: Self-pay | Admitting: Neurology

## 2022-03-28 ENCOUNTER — Encounter: Payer: Self-pay | Admitting: Neurology

## 2022-03-28 ENCOUNTER — Encounter (HOSPITAL_COMMUNITY): Payer: Self-pay | Admitting: Hematology

## 2022-03-28 VITALS — BP 110/76 | HR 99 | Ht 68.0 in | Wt 230.0 lb

## 2022-03-28 DIAGNOSIS — G40219 Localization-related (focal) (partial) symptomatic epilepsy and epileptic syndromes with complex partial seizures, intractable, without status epilepticus: Secondary | ICD-10-CM

## 2022-03-28 DIAGNOSIS — R400 Somnolence: Secondary | ICD-10-CM
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
## 2022-03-28 NOTE — Progress Notes (Signed)
Has had one seizure from last visit

## 2022-03-30 ENCOUNTER — Telehealth: Payer: Self-pay | Admitting: Neurology

## 2022-03-30 NOTE — Telephone Encounter (Signed)
Patient called and states that he was put on a medication ( he does not know the name of it ). He states that he was to send some paper work in and so was [place] about the medication. He has sent his paperwork in and so she would need to send her part in. He states that she knows about this and told him to call when he got his in so she could do her part   Per patient ok to leave a message if he does not answer

## 2022-04-03 NOTE — Telephone Encounter (Signed)
Pt called an informed that we need him to come by and sign paperwork for the xcorpi

## 2022-04-03 NOTE — Telephone Encounter (Signed)
It's for Xcopri, do you have the forms to get patients started? Thank you!

## 2022-04-04 ENCOUNTER — Telehealth: Payer: Self-pay | Admitting: Neurology

## 2022-04-04 NOTE — Telephone Encounter (Signed)
Patient states that he was calling to get some paperwork faxed to him and was told to call back with the fax number  please fax the paper work to [phone]

## 2022-04-04 NOTE — Telephone Encounter (Signed)
Paperwork faxed to pt at [phone] waiting for him to fax back signed papers

## 2022-04-08 ENCOUNTER — Other Ambulatory Visit: Payer: Self-pay | Admitting: Nurse Practitioner

## 2022-04-11 ENCOUNTER — Telehealth: Payer: Self-pay | Admitting: Neurology

## 2022-04-11 DIAGNOSIS — R400 Somnolence: Secondary | ICD-10-CM

## 2022-04-11 DIAGNOSIS — R569 Unspecified convulsions: Secondary | ICD-10-CM

## 2022-04-11 NOTE — Telephone Encounter (Signed)
See other phone note already taken care of

## 2022-04-11 NOTE — Telephone Encounter (Signed)
Ans service called on this patient re: orders.  I don't see we sent orders on this patient but please call Sharee Pimple back at [phone]

## 2022-04-11 NOTE — Telephone Encounter (Signed)
The following message was left with [facility] on 04/11/22 at 1:02 PM.  Carlos Miranda from [facility] states an order was put in for him and after review it shows pt has a history of seizures. Caller states she wants an order put in with the [facility] with seizures montage.

## 2022-04-11 NOTE — Telephone Encounter (Signed)
I spoke with Dr Delice Lesch its ok to change the pt sleep study to an in lab study order was placed and [facility] sleep [facility] called with the update

## 2022-04-13 ENCOUNTER — Other Ambulatory Visit: Payer: Self-pay

## 2022-04-13 ENCOUNTER — Telehealth: Payer: Self-pay | Admitting: Neurology

## 2022-04-13 DIAGNOSIS — R400 Somnolence: Secondary | ICD-10-CM

## 2022-04-13 DIAGNOSIS — R569 Unspecified convulsions: Secondary | ICD-10-CM

## 2022-04-13 NOTE — Telephone Encounter (Signed)
Sharee Pimple called an order was fixed

## 2022-04-13 NOTE — Telephone Encounter (Signed)
The following message was left with [facility] on 04/12/22 at 4:40 PM.  Carlos Miranda from [facility] called again after hours requesting a call back about this patient after 12:30 tomorrow.

## 2022-04-18 ENCOUNTER — Encounter: Payer: Medicaid Other | Admitting: Neurology

## 2022-04-28 ENCOUNTER — Ambulatory Visit (INDEPENDENT_AMBULATORY_CARE_PROVIDER_SITE_OTHER): Payer: Self-pay | Admitting: Nurse Practitioner

## 2022-04-28 ENCOUNTER — Encounter: Payer: Self-pay | Admitting: Nurse Practitioner

## 2022-04-28 VITALS — BP 130/83 | HR 89 | Temp 97.8°F | Ht 68.0 in | Wt 242.0 lb

## 2022-04-28 DIAGNOSIS — H6123 Impacted cerumen, bilateral: Secondary | ICD-10-CM

## 2022-04-28 MED ORDER — IBUPROFEN 600 MG PO TABS: 600.0000 mg | ORAL_TABLET | Freq: Three times a day (TID) | ORAL | 0 refills | Status: AC | PRN

## 2022-04-28 NOTE — Progress Notes (Signed)
Pulm  Acute Office Visit  Subjective:     Patient ID: Carlos Miranda, male    DOB: 02/05/1961, 61 y.o.   MRN: [id number]  Chief Complaint  Patient presents with   Ear Fullness    Right ear , no pain - cant hear    Nasal Congestion    Ear Fullness  There is pain in both ears. This is a new problem. The current episode started 1 to 4 weeks ago. The problem has been unchanged. There has been no fever. The patient is experiencing no pain. Pertinent negatives include no ear discharge, headaches, neck pain or sore throat. He has tried nothing (hydrogen peroxide) for the symptoms. His past medical history is significant for hearing loss. left ear   Review of Systems  Constitutional: Negative.   HENT:  Negative for ear discharge and sore throat.   Eyes: Negative.   Cardiovascular: Negative.   Genitourinary: Negative.   Musculoskeletal:  Negative for neck pain.  Neurological:  Negative for headaches.  All other systems reviewed and are negative.       Objective:    BP 130/83   Pulse 89   Temp 97.8 F (36.6 C)   Ht '5\' 8"'$  (1.727 m)   Wt 242 lb (109.8 kg)   SpO2 96%   BMI 36.80 kg/m  BP Readings from Last 3 Encounters:  04/28/22 130/83  03/28/22 110/76  02/14/22 130/86   Wt Readings from Last 3 Encounters:  04/28/22 242 lb (109.8 kg)  03/28/22 230 lb (104.3 kg)  02/14/22 235 lb 6.4 oz (106.8 kg)      Physical Exam Vitals and nursing note reviewed.  Constitutional:      Appearance: He is overweight.  HENT:     Head: Normocephalic.     Right Ear: External ear normal. There is impacted cerumen.     Left Ear: External ear normal. There is impacted cerumen.     Mouth/Throat:     Mouth: Mucous membranes are moist.     Pharynx: Oropharynx is clear.  Eyes:     Conjunctiva/sclera: Conjunctivae normal.  Cardiovascular:     Rate and Rhythm: Normal rate and regular rhythm.  Pulmonary:     Effort: Pulmonary effort is normal.     Breath sounds: Normal breath sounds.   Neurological:     Mental Status: He is alert and oriented to person, place, and time.  Psychiatric:        Behavior: Behavior normal. Behavior is cooperative.     No results found for any visits on 04/28/22.      Assessment & Plan:  Presents with tenderness in the right ear and mild fullness.  Left ear completed deaf.  On assessment bilateral ear impacted cerumen.  Bilateral ear flushed and impacted patient tolerated well.  Advised patient in the future to use Debrox to help soften earwax.  Ibuprofen as needed for pain.  Follow-up with unresolved symptoms. Problem List Items Addressed This Visit   None Visit Diagnoses     Bilateral impacted cerumen    -  Primary   Relevant Medications   ibuprofen (ADVIL) 600 MG tablet       Meds ordered this encounter  Medications   ibuprofen (ADVIL) 600 MG tablet    Sig: Take 1 tablet (600 mg total) by mouth every 8 (eight) hours as needed.    Dispense:  30 tablet    Refill:  0    Order Specific Question:   Supervising Provider  AnswerClaretta Fraise [[id number]]    Return if symptoms worsen or fail to improve.  Ivy Lynn, NP

## 2022-04-28 NOTE — Patient Instructions (Signed)

## 2022-05-01 ENCOUNTER — Telehealth: Payer: Self-pay | Admitting: Neurology

## 2022-05-01 NOTE — Telephone Encounter (Signed)
Pls remind him about AVS instructions: Reduce Keppra to 1m every night for 1 week, then stop Keppra   2. Continue Lamotrigine ER '300mg'$  daily and Oxtellar XR '600mg'$  + '300mg'$  daily.   Has he stopped the Keppra? That is the only medicine he would go off and can start the XColumbusanytime. Continue Lamotrigine and Oxtellar. Thanks

## 2022-05-01 NOTE — Telephone Encounter (Signed)
Patient advised of Dr.Aqunio note below.

## 2022-05-01 NOTE — Telephone Encounter (Signed)
Patient called with question about starting Xcopri.   He said he is not sure if he is to stop his other medications before starting it.

## 2022-05-09 ENCOUNTER — Inpatient Hospital Stay (HOSPITAL_COMMUNITY): Payer: Medicaid Other | Attending: Hematology

## 2022-05-09 ENCOUNTER — Ambulatory Visit (HOSPITAL_COMMUNITY)
Admission: RE | Admit: 2022-05-09 | Discharge: 2022-05-09 | Disposition: A | Discharge status: Home / Self Care | Payer: Medicaid Other | Source: Ambulatory Visit | Attending: Hematology | Admitting: Hematology

## 2022-05-09 DIAGNOSIS — R519 Headache, unspecified: Secondary | ICD-10-CM | POA: Insufficient documentation

## 2022-05-09 DIAGNOSIS — Z79899 Other long term (current) drug therapy: Secondary | ICD-10-CM | POA: Diagnosis not present

## 2022-05-09 DIAGNOSIS — I10 Essential (primary) hypertension: Secondary | ICD-10-CM | POA: Insufficient documentation

## 2022-05-09 DIAGNOSIS — E785 Hyperlipidemia, unspecified: Secondary | ICD-10-CM | POA: Insufficient documentation

## 2022-05-09 DIAGNOSIS — Z87891 Personal history of nicotine dependence: Secondary | ICD-10-CM | POA: Insufficient documentation

## 2022-05-09 DIAGNOSIS — Z888 Allergy status to other drugs, medicaments and biological substances status: Secondary | ICD-10-CM | POA: Insufficient documentation

## 2022-05-09 DIAGNOSIS — Z882 Allergy status to sulfonamides status: Secondary | ICD-10-CM | POA: Diagnosis not present

## 2022-05-09 DIAGNOSIS — Z9049 Acquired absence of other specified parts of digestive tract: Secondary | ICD-10-CM | POA: Diagnosis not present

## 2022-05-09 DIAGNOSIS — Z880 Allergy status to penicillin: Secondary | ICD-10-CM | POA: Diagnosis not present

## 2022-05-09 DIAGNOSIS — R197 Diarrhea, unspecified: Secondary | ICD-10-CM | POA: Diagnosis not present

## 2022-05-09 DIAGNOSIS — Z8269 Family history of other diseases of the musculoskeletal system and connective tissue: Secondary | ICD-10-CM | POA: Insufficient documentation

## 2022-05-09 DIAGNOSIS — R200 Anesthesia of skin: Secondary | ICD-10-CM | POA: Diagnosis not present

## 2022-05-09 DIAGNOSIS — E876 Hypokalemia: Secondary | ICD-10-CM | POA: Insufficient documentation

## 2022-05-09 DIAGNOSIS — Z8249 Family history of ischemic heart disease and other diseases of the circulatory system: Secondary | ICD-10-CM | POA: Diagnosis not present

## 2022-05-09 DIAGNOSIS — C182 Malignant neoplasm of ascending colon: Secondary | ICD-10-CM | POA: Insufficient documentation

## 2022-05-09 DIAGNOSIS — G629 Polyneuropathy, unspecified: Secondary | ICD-10-CM | POA: Insufficient documentation

## 2022-05-09 DIAGNOSIS — Z8261 Family history of arthritis: Secondary | ICD-10-CM | POA: Insufficient documentation

## 2022-05-09 DIAGNOSIS — E669 Obesity, unspecified: Secondary | ICD-10-CM | POA: Diagnosis not present

## 2022-05-09 LAB — CBC WITH DIFFERENTIAL/PLATELET
Abs Immature Granulocytes: 0.02 10*3/uL (ref 0.00–0.07)
Basophils Absolute: 0.1 10*3/uL (ref 0.0–0.1)
Basophils Relative: 1 %
Eosinophils Absolute: 0.4 10*3/uL (ref 0.0–0.5)
Eosinophils Relative: 8 %
HCT: 49.7 % (ref 39.0–52.0)
Hemoglobin: 16.9 g/dL (ref 13.0–17.0)
Immature Granulocytes: 0 %
Lymphocytes Relative: 25 %
Lymphs Abs: 1.4 10*3/uL (ref 0.7–4.0)
MCH: 32.1 pg (ref 26.0–34.0)
MCHC: 34 g/dL (ref 30.0–36.0)
MCV: 94.5 fL (ref 80.0–100.0)
Monocytes Absolute: 0.5 10*3/uL (ref 0.1–1.0)
Monocytes Relative: 9 %
Neutro Abs: 3.3 10*3/uL (ref 1.7–7.7)
Neutrophils Relative %: 57 %
Platelets: 167 10*3/uL (ref 150–400)
RBC: 5.26 MIL/uL (ref 4.22–5.81)
RDW: 12.9 % (ref 11.5–15.5)
WBC: 5.8 10*3/uL (ref 4.0–10.5)
nRBC: 0 % (ref 0.0–0.2)

## 2022-05-09 LAB — COMPREHENSIVE METABOLIC PANEL WITH GFR
ALT: 28 U/L (ref 0–44)
AST: 26 U/L (ref 15–41)
Albumin: 4.2 g/dL (ref 3.5–5.0)
Alkaline Phosphatase: 115 U/L (ref 38–126)
Anion gap: 3 — ABNORMAL LOW (ref 5–15)
BUN: 14 mg/dL (ref 6–20)
CO2: 26 mmol/L (ref 22–32)
Calcium: 8.8 mg/dL — ABNORMAL LOW (ref 8.9–10.3)
Chloride: 109 mmol/L (ref 98–111)
Creatinine, Ser: 1.07 mg/dL (ref 0.61–1.24)
GFR, Estimated: >60 mL/min
Glucose, Bld: 102 mg/dL — ABNORMAL HIGH (ref 70–99)
Potassium: 4.4 mmol/L (ref 3.5–5.1)
Sodium: 138 mmol/L (ref 135–145)
Total Bilirubin: 0.8 mg/dL (ref 0.3–1.2)
Total Protein: 7.4 g/dL (ref 6.5–8.1)

## 2022-05-09 MED ORDER — IOHEXOL 300 MG/ML  SOLN
100.0000 mL | Freq: Once | INTRAMUSCULAR | Status: AC | PRN
Start: 2022-05-09 — End: 2022-05-09
  Administered 2022-05-09: 100 mL via INTRAVENOUS

## 2022-05-10 DIAGNOSIS — Z0279 Encounter for issue of other medical certificate: Secondary | ICD-10-CM

## 2022-05-11 LAB — CEA: CEA: 1.4 ng/mL (ref 0.0–4.7)

## 2022-05-18 ENCOUNTER — Other Ambulatory Visit: Payer: Self-pay | Admitting: Neurology

## 2022-05-18 MED ORDER — XCOPRI 100 MG PO TABS: ORAL_TABLET | ORAL | 5 refills | Status: DC

## 2022-05-18 MED ORDER — XCOPRI 14 X 50 MG & 14 X100 MG PO TBPK
ORAL_TABLET | ORAL | 0 refills | Status: DC
Start: 2022-05-18 — End: 2022-06-22

## 2022-05-23 ENCOUNTER — Inpatient Hospital Stay (HOSPITAL_BASED_OUTPATIENT_CLINIC_OR_DEPARTMENT_OTHER): Payer: Medicaid Other | Admitting: Hematology

## 2022-05-23 VITALS — BP 148/86 | HR 96 | Temp 97.3°F | Resp 19 | Ht 68.0 in | Wt 246.9 lb

## 2022-05-23 DIAGNOSIS — Z882 Allergy status to sulfonamides status: Secondary | ICD-10-CM | POA: Diagnosis not present

## 2022-05-23 DIAGNOSIS — Z87891 Personal history of nicotine dependence: Secondary | ICD-10-CM | POA: Diagnosis not present

## 2022-05-23 DIAGNOSIS — Z8261 Family history of arthritis: Secondary | ICD-10-CM | POA: Diagnosis not present

## 2022-05-23 DIAGNOSIS — R197 Diarrhea, unspecified: Secondary | ICD-10-CM | POA: Diagnosis not present

## 2022-05-23 DIAGNOSIS — R519 Headache, unspecified: Secondary | ICD-10-CM | POA: Diagnosis not present

## 2022-05-23 DIAGNOSIS — C182 Malignant neoplasm of ascending colon: Secondary | ICD-10-CM

## 2022-05-23 DIAGNOSIS — Z8249 Family history of ischemic heart disease and other diseases of the circulatory system: Secondary | ICD-10-CM | POA: Diagnosis not present

## 2022-05-23 DIAGNOSIS — Z79899 Other long term (current) drug therapy: Secondary | ICD-10-CM | POA: Diagnosis not present

## 2022-05-23 DIAGNOSIS — E785 Hyperlipidemia, unspecified: Secondary | ICD-10-CM | POA: Diagnosis not present

## 2022-05-23 DIAGNOSIS — E876 Hypokalemia: Secondary | ICD-10-CM | POA: Diagnosis not present

## 2022-05-23 DIAGNOSIS — I10 Essential (primary) hypertension: Secondary | ICD-10-CM | POA: Diagnosis not present

## 2022-05-23 DIAGNOSIS — Z8269 Family history of other diseases of the musculoskeletal system and connective tissue: Secondary | ICD-10-CM | POA: Diagnosis not present

## 2022-05-23 DIAGNOSIS — R200 Anesthesia of skin: Secondary | ICD-10-CM | POA: Diagnosis not present

## 2022-05-23 DIAGNOSIS — Z9049 Acquired absence of other specified parts of digestive tract: Secondary | ICD-10-CM | POA: Diagnosis not present

## 2022-05-23 DIAGNOSIS — Z880 Allergy status to penicillin: Secondary | ICD-10-CM | POA: Diagnosis not present

## 2022-05-23 DIAGNOSIS — Z888 Allergy status to other drugs, medicaments and biological substances status: Secondary | ICD-10-CM | POA: Diagnosis not present

## 2022-05-23 DIAGNOSIS — E669 Obesity, unspecified: Secondary | ICD-10-CM | POA: Diagnosis not present

## 2022-05-23 DIAGNOSIS — G629 Polyneuropathy, unspecified: Secondary | ICD-10-CM | POA: Diagnosis not present

## 2022-05-23 NOTE — Patient Instructions (Signed)
[place] at [facility] Discharge Instructions  You were seen and examined today by Dr. Delton Coombes.  Dr. Delton Coombes discussed your most recent lab work and CT scan which revealed that everything looks good.  Follow-up as scheduled in 3 months.   Thank you for choosing [place] at [facility] to provide your oncology and hematology care.  To afford each patient quality time with our provider, please arrive at least 15 minutes before your scheduled appointment time.   If you have a lab appointment with the [facility] please come in thru the Main Entrance and check in at the main information desk.  You need to re-schedule your appointment should you arrive 10 or more minutes late.  We strive to give you quality time with our providers, and arriving late affects you and other patients whose appointments are after yours.  Also, if you no show three or more times for appointments you may be dismissed from the clinic at the providers discretion.     Again, thank you for choosing [facility].  Our hope is that these requests will decrease the amount of time that you wait before being seen by our physicians.       _____________________________________________________________  Should you have questions after your visit to [facility], please contact our office at [phone] and follow the prompts.  Our office hours are 8:00 a.m. and 4:30 p.m. Monday - Friday.  Please note that voicemails left after 4:00 p.m. may not be returned until the following business day.  We are closed weekends and major holidays.  You do have access to a nurse 24-7, just call the main number to the clinic [phone] and do not press any options, hold on the line and a nurse will answer the phone.    For prescription refill requests, have your pharmacy contact our office and allow 72 hours.
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
## 2022-05-25 ENCOUNTER — Ambulatory Visit (HOSPITAL_BASED_OUTPATIENT_CLINIC_OR_DEPARTMENT_OTHER): Payer: Medicaid Other | Attending: Neurology | Admitting: Internal Medicine

## 2022-05-25 DIAGNOSIS — R400 Somnolence: Secondary | ICD-10-CM | POA: Diagnosis not present

## 2022-05-25 DIAGNOSIS — R569 Unspecified convulsions: Secondary | ICD-10-CM | POA: Insufficient documentation

## 2022-05-25 DIAGNOSIS — G4733 Obstructive sleep apnea (adult) (pediatric): Secondary | ICD-10-CM | POA: Diagnosis not present

## 2022-05-25 DIAGNOSIS — G4736 Sleep related hypoventilation in conditions classified elsewhere: Secondary | ICD-10-CM | POA: Insufficient documentation

## 2022-05-28 DIAGNOSIS — R400 Somnolence: Secondary | ICD-10-CM

## 2022-05-28 NOTE — Procedures (Signed)
     Patient Name: Carlos Miranda, Araki Date: 05/25/2022 Gender: Male D.O.B: 10/07/1961 Age (years): 60 Referring Provider: Cameron Sprang Height (inches): 16 Interpreting Physician: Baird Lyons MD, ABSM Weight (lbs): 240 RPSGT: Zadie Rhine BMI: 36 MRN: [id number] Neck Size: 17.50  CLINICAL INFORMATION Sleep Study Type: NPSG Indication for sleep study: Daytime Fatigue, Morbid Obesity, Non-refreshing Sleep, Snoring, Witnesses Apnea / Gasping During Sleep Epworth Sleepiness Score: 6  SLEEP STUDY TECHNIQUE As per the AASM Manual for the Scoring of Sleep and Associated Events v2.3 (April 2016) with a hypopnea requiring 4% desaturations.  The channels recorded and monitored were frontal, central and occipital EEG, electrooculogram (EOG), submentalis EMG (chin), nasal and oral airflow, thoracic and abdominal wall motion, anterior tibialis EMG, snore microphone, electrocardiogram, and pulse oximetry.  MEDICATIONS Medications self-administered by patient taken the night of the study : MELATONIN, LAMOTRIGINE, ZESTRIL, Oxtellar xr, PRAVASTATIN, Xcopri  SLEEP ARCHITECTURE The study was initiated at 9:51:33 PM and ended at 4:48:51 AM.  Sleep onset time was 11.2 minutes and the sleep efficiency was 78.2%%. The total sleep time was 326.5 minutes.  Stage REM latency was 83.0 minutes.  The patient spent 9.5%% of the night in stage N1 sleep, 63.9%% in stage N2 sleep, 0.0%% in stage N3 and 26.7% in REM.  Alpha intrusion was absent.  Supine sleep was 34.00%.  RESPIRATORY PARAMETERS The overall apnea/hypopnea index (AHI) was 14.0 per hour. There were 31 total apneas, including 12 obstructive, 19 central and 0 mixed apneas. There were 45 hypopneas and 44 RERAs.  The AHI during Stage REM sleep was 28.3 per hour.  AHI while supine was 7.6 per hour.  The mean oxygen saturation was 90.3%. The minimum SpO2 during sleep was 69.0%.  loud snoring was noted during this study.  CARDIAC  DATA The 2 lead EKG demonstrated sinus rhythm. The mean heart rate was 67.8 beats per minute. Other EKG findings include: None.  LEG MOVEMENT DATA The total PLMS were 0 with a resulting PLMS index of 0.0. Associated arousal with leg movement index was 2.0 .  IMPRESSIONS - Mild obstructive sleep apnea occurred during this study (AHI = 14.0/h). - No significant central sleep apnea occurred during this study (CAI = 3.5/h). - Oxygen desaturation was noted during this study (Min O2 = 69.0%).  Mean 90.3%. Time with O2 saturation 88% or less was 61.6 minutes. - The patient snored with loud snoring volume. - No cardiac abnormalities were noted during this study. - Limb movements total 115 (21.1/ hr). Limb movements with arousal 11 (2/ hr)  DIAGNOSIS - Obstructive Sleep Apnea (G47.33) - Nocturnal Hypoxemia (G47.36)  RECOMMENDATIONS - Suggest CPAP titration sleep study or autopap. Other options would be based on clinical judgment. - Be careful with alcohol, sedatives and other CNS depressants that may worsen sleep apnea and disrupt normal sleep architecture. - Sleep hygiene should be reviewed to assess factors that may improve sleep quality. - Weight management and regular exercise should be initiated or continued if appropriate.  [Electronically signed] 05/28/2022 11:25 AM  Baird Lyons MD, ABSM Diplomate, American Board of Sleep Medicine NPI: [id number]                        [place], [place] [facility]  ELECTRONICALLY SIGNED ON:  05/28/2022, 11:20 AM [place] PH: [phone]   FX: [phone] [place]

## 2022-05-29 ENCOUNTER — Telehealth: Payer: Self-pay

## 2022-05-29 NOTE — Telephone Encounter (Signed)
-----   Message from Cameron Sprang, MD sent at 05/29/2022 10:09 AM EDT ----- Regarding: sleep study results Pls let patient know the sleep study showed mild sleep apnea. I would like to send him to the sleep specialist to discuss options. Pls send referral to Pulmonary (dx: obstructive sleep apnea). Thanks  ----- Message ----- From: Deneise Lever, MD Sent: 05/28/2022  11:27 AM EDT To: Cameron Sprang, MD

## 2022-05-29 NOTE — Telephone Encounter (Signed)
Left message on machine for patient to call back.

## 2022-05-30 ENCOUNTER — Other Ambulatory Visit: Payer: Self-pay

## 2022-05-30 DIAGNOSIS — G4733 Obstructive sleep apnea (adult) (pediatric): Secondary | ICD-10-CM

## 2022-05-30 NOTE — Telephone Encounter (Signed)
Pt called and given results per Dr. Delice Lesch. He understood and a referral was made to a Pulmonary.

## 2022-06-08 ENCOUNTER — Encounter: Payer: Self-pay | Admitting: Family Medicine

## 2022-06-08 ENCOUNTER — Ambulatory Visit: Payer: BLUE CROSS/BLUE SHIELD | Admitting: Family Medicine

## 2022-06-08 ENCOUNTER — Ambulatory Visit (INDEPENDENT_AMBULATORY_CARE_PROVIDER_SITE_OTHER): Payer: Self-pay | Admitting: Family Medicine

## 2022-06-08 VITALS — BP 131/86 | HR 86 | Temp 98.0°F | Ht 68.0 in | Wt 242.0 lb

## 2022-06-08 DIAGNOSIS — E785 Hyperlipidemia, unspecified: Secondary | ICD-10-CM

## 2022-06-08 DIAGNOSIS — I10 Essential (primary) hypertension: Secondary | ICD-10-CM

## 2022-06-08 MED ORDER — LISINOPRIL 10 MG PO TABS: 10.0000 mg | ORAL_TABLET | Freq: Every day | ORAL | 3 refills | Status: DC

## 2022-06-08 MED ORDER — TADALAFIL 10 MG PO TABS: 10.0000 mg | ORAL_TABLET | ORAL | 2 refills | Status: DC | PRN

## 2022-06-08 MED ORDER — PRAVASTATIN SODIUM 40 MG PO TABS: 40.0000 mg | ORAL_TABLET | Freq: Every day | ORAL | 3 refills | Status: DC

## 2022-06-08 NOTE — Progress Notes (Signed)
BP 131/86   Pulse 86   Temp 98 F (36.7 C)   Ht 5\' 8"  (1.727 m)   Wt 242 lb (109.8 kg)   SpO2 95%   BMI 36.80 kg/m    Subjective:   Patient ID: Carlos Miranda, male    DOB: 1961-04-23, 61 y.o.   MRN: [id number]  HPI: Carlos Miranda is a 61 y.o. male presenting on 06/08/2022 for Medical Management of Chronic Issues, Hyperlipidemia, and Hypertension   HPI Hypertension Patient is currently on lisinopril, and their blood pressure today is 131/80. Patient denies any lightheadedness or dizziness. Patient denies headaches, blurred vision, chest pains, shortness of breath, or weakness. Denies any side effects from medication and is content with current medication.   Hyperlipidemia Patient is coming in for recheck of his hyperlipidemia. The patient is currently taking pravastatin. They deny any issues with myalgias or history of liver damage from it. They deny any focal numbness or weakness or chest pain.   Patient has erectile dysfunction did not feel like the Viagra generic worked for him and he would like to try Cialis.  He says he will get an erection but upon penetration and just disappears  Patient sees hematology for him to have neutropenia  Patient sees neurology for seizure disorder  Relevant past medical, surgical, family and social history reviewed and updated as indicated. Interim medical history since our last visit reviewed. Allergies and medications reviewed and updated.  Review of Systems  Constitutional:  Negative for chills and fever.  Eyes:  Negative for visual disturbance.  Respiratory:  Negative for shortness of breath and wheezing.   Cardiovascular:  Negative for chest pain and leg swelling.  Musculoskeletal:  Negative for back pain and gait problem.  Skin:  Negative for rash.  Neurological:  Negative for dizziness, weakness and light-headedness.  All other systems reviewed and are negative.   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 If you have any questions, ask your nurse or doctor.          STOP taking these medications    sildenafil 20 MG tablet Commonly known as: REVATIO Stopped by: Elige Radon Javoris Star, MD       TAKE these medications    ibuprofen 600 MG tablet Commonly known as: ADVIL Take 1 tablet (600 mg total) by mouth every 8 (eight) hours as needed.   LamoTRIgine 300 MG Tb24 24 hour tablet Take 1 tablet every night   lisinopril 10 MG tablet Commonly known as: ZESTRIL Take 1 tablet (10 mg total) by mouth daily.   MELATONIN PO Take by mouth.   Oxtellar XR 600 MG Tb24 Generic drug: OXcarbazepine ER Take 1 tablet every night (take with Oxtellar 300mg  tablet for total of 900mg  every night)   pravastatin 40 MG tablet Commonly known as: PRAVACHOL Take 1 tablet (40 mg total) by mouth daily.   tadalafil 10 MG  tablet Commonly known as: CIALIS Take 1 tablet (10 mg total) by mouth every other day as needed for erectile dysfunction. Started by: Elige Radon Sani Madariaga, MD   Xcopri 14 x 50 MG & 14 x100 MG Tbpk Generic drug: Cenobamate Take 50mg  tablet every night for 2 weeks, then take 100mg  tablet every night for 2 weeks. After finishing starter pack, start 100mg  tablets every night         Objective:   BP 131/86   Pulse 86   Temp 98 F (36.7 C)   Ht 5\' 8"  (1.727 m)   Wt 242 lb (109.8 kg)   SpO2 95%   BMI 36.80 kg/m   Wt Readings from Last 3 Encounters:  06/08/22 242 lb (109.8 kg)  05/25/22 240 lb (108.9 kg)   05/23/22 246 lb 14.4 oz (112 kg)    Physical Exam Vitals and nursing note reviewed.  Constitutional:      General: He is not in acute distress.    Appearance: He is well-developed. He is not diaphoretic.  Eyes:     General: No scleral icterus.    Conjunctiva/sclera: Conjunctivae normal.  Neck:     Thyroid: No thyromegaly.  Cardiovascular:     Rate and Rhythm: Normal rate and regular rhythm.     Heart sounds: Normal heart sounds. No murmur heard. Pulmonary:     Effort: Pulmonary effort is normal. No respiratory distress.     Breath sounds: Normal breath sounds. No wheezing.  Musculoskeletal:        General: No swelling. Normal range of motion.     Cervical back: Neck supple.  Lymphadenopathy:     Cervical: No cervical adenopathy.  Skin:    General: Skin is warm and dry.     Findings: No rash.  Neurological:     Mental Status: He is alert and oriented to person, place, and time.     Coordination: Coordination normal.  Psychiatric:        Behavior: Behavior normal.     Results for orders placed or performed in visit on 05/09/22  Comprehensive metabolic panel  Result Value Ref Range   Sodium 138 135 - 145 mmol/L   Potassium 4.4 3.5 - 5.1 mmol/L   Chloride 109 98 - 111 mmol/L   CO2 26 22 - 32 mmol/L   Glucose, Bld 102 (H) 70 - 99 mg/dL   BUN 14 6 - 20 mg/dL   Creatinine, Ser 2.72 0.61 - 1.24 mg/dL   Calcium 8.8 (L) 8.9 - 10.3 mg/dL   Total Protein 7.4 6.5 - 8.1 g/dL   Albumin 4.2 3.5 - 5.0 g/dL   AST 26 15 - 41 U/L   ALT 28 0 - 44 U/L   Alkaline Phosphatase 115 38 - 126 U/L   Total Bilirubin 0.8 0.3 - 1.2 mg/dL   GFR, Estimated >53 >66 mL/min   Anion gap 3 (L) 5 - 15  CEA  Result Value Ref Range   CEA 1.4 0.0 - 4.7 ng/mL  CBC with Differential/Platelet  Result Value Ref Range   WBC 5.8 4.0 - 10.5 K/uL   RBC 5.26 4.22 - 5.81 MIL/uL   Hemoglobin 16.9 13.0 - 17.0 g/dL   HCT 44.0 34.7 - 42.5 %   MCV 94.5 80.0 - 100.0 fL   MCH 32.1 26.0 - 34.0 pg   MCHC 34.0  30.0 - 36.0 g/dL   RDW 95.6 38.7 - 56.4 %   Platelets 167 150 - 400 K/uL  nRBC 0.0 0.0 - 0.2 %   Neutrophils Relative % 57 %   Neutro Abs 3.3 1.7 - 7.7 K/uL   Lymphocytes Relative 25 %   Lymphs Abs 1.4 0.7 - 4.0 K/uL   Monocytes Relative 9 %   Monocytes Absolute 0.5 0.1 - 1.0 K/uL   Eosinophils Relative 8 %   Eosinophils Absolute 0.4 0.0 - 0.5 K/uL   Basophils Relative 1 %   Basophils Absolute 0.1 0.0 - 0.1 K/uL   Immature Granulocytes 0 %   Abs Immature Granulocytes 0.02 0.00 - 0.07 K/uL    Assessment & Plan:   Problem List Items Addressed This Visit       Cardiovascular and Mediastinum   HTN (hypertension)   Relevant Medications   lisinopril (ZESTRIL) 10 MG tablet   pravastatin (PRAVACHOL) 40 MG tablet   tadalafil (CIALIS) 10 MG tablet     Other   HLD (hyperlipidemia) - Primary   Relevant Medications   lisinopril (ZESTRIL) 10 MG tablet   pravastatin (PRAVACHOL) 40 MG tablet   tadalafil (CIALIS) 10 MG tablet   Other Relevant Orders   Lipid panel   Other Visit Diagnoses     Essential hypertension       Relevant Medications   lisinopril (ZESTRIL) 10 MG tablet   pravastatin (PRAVACHOL) 40 MG tablet   tadalafil (CIALIS) 10 MG tablet       Continue current medicine, will switch from Viagra to Cialis so he can try.  Otherwise no changes. Follow up plan: Return in about 6 months (around 12/09/2022), or if symptoms worsen or fail to improve, for Hypertension and cholesterol recheck.  Counseling provided for all of the vaccine components Orders Placed This Encounter  Procedures   Lipid panel    Arville Care, MD [facility] 06/08/2022, 9:40 AM

## 2022-06-09 LAB — LIPID PANEL
Chol/HDL Ratio: 3.3 ratio (ref 0.0–5.0)
Cholesterol, Total: 154 mg/dL (ref 100–199)
HDL: 47 mg/dL (ref >39)
LDL Chol Calc (NIH): 80 mg/dL (ref 0–99)
Triglycerides: 159 mg/dL — ABNORMAL HIGH (ref 0–149)
VLDL Cholesterol Cal: 27 mg/dL (ref 5–40)

## 2022-06-22 ENCOUNTER — Other Ambulatory Visit: Payer: Self-pay | Admitting: Neurology

## 2022-06-22 MED ORDER — XCOPRI 100 MG PO TABS
ORAL_TABLET | ORAL | 5 refills | Status: DC
Start: 2022-06-22 — End: 2022-11-27

## 2022-06-23 ENCOUNTER — Ambulatory Visit (INDEPENDENT_AMBULATORY_CARE_PROVIDER_SITE_OTHER): Payer: Medicaid Other | Admitting: Nurse Practitioner

## 2022-06-23 ENCOUNTER — Encounter: Payer: Self-pay | Admitting: Nurse Practitioner

## 2022-06-23 VITALS — BP 122/76 | HR 85 | Temp 98.0°F | Ht 68.0 in | Wt 246.2 lb

## 2022-06-23 DIAGNOSIS — G4734 Idiopathic sleep related nonobstructive alveolar hypoventilation: Secondary | ICD-10-CM | POA: Diagnosis not present

## 2022-06-23 DIAGNOSIS — E669 Obesity, unspecified: Secondary | ICD-10-CM | POA: Insufficient documentation

## 2022-06-23 DIAGNOSIS — Z6837 Body mass index (BMI) 37.0-37.9, adult: Secondary | ICD-10-CM | POA: Diagnosis not present

## 2022-06-23 DIAGNOSIS — G4733 Obstructive sleep apnea (adult) (pediatric): Secondary | ICD-10-CM

## 2022-06-23 DIAGNOSIS — G4719 Other hypersomnia: Secondary | ICD-10-CM | POA: Insufficient documentation

## 2022-06-23 NOTE — Assessment & Plan Note (Signed)
He spent 61.6 minutes less than 88% during his NPSG; average was 90.3 and SpO2 low was 69%. He will need ONO after he is established on CPAP to determine if he requires supplemental O2.

## 2022-06-23 NOTE — Assessment & Plan Note (Signed)
He has snoring, excessive daytime sleepiness, nocturnal apneic events. BMI 37. History of HTN.  He underwent in lab sleep study, ordered by his neurologist, which revealed mild to moderate OSA with significant oxygen desaturations. He is agreeable to CPAP therapy - order sent for new start CPAP 5-15 cmH2O, mask of choice, and heated humidification. We will obtain ONO on CPAP once he is established to determine O2 requirement.    - discussed how weight can impact sleep and risk for sleep disordered breathing - discussed options to assist with weight loss: combination of diet modification, cardiovascular and strength training exercises   - had an extensive discussion regarding the adverse health consequences related to untreated sleep disordered breathing - specifically discussed the risks for hypertension, coronary artery disease, cardiac dysrhythmias, cerebrovascular disease, and diabetes - lifestyle modification discussed   - discussed how sleep disruption can increase risk of accidents, particularly when driving - safe driving practices were discussed  Patient Instructions  Start to use CPAP every night, minimum of 4-6 hours a night.  Change equipment every 30 days or as directed by DME. Wash your tubing with warm soap and water daily, hang to dry. Wash humidifier portion weekly.  Be aware of reduced alertness and do not drive or operate heavy machinery if experiencing this or drowsiness.  Healthy weight management discussed.  Avoid or decrease alcohol consumption and medications that make you more sleepy, if possible. Notify if persistent daytime sleepiness occurs even with consistent use of CPAP.  We discussed how untreated sleep apnea puts an individual at risk for cardiac arrhthymias, pulm HTN, DM, stroke and increases their risk for daytime accidents. We also briefly reviewed treatment options including weight loss, side sleeping position, oral appliance, CPAP therapy or referral to ENT  for possible surgical options  Follow up in 6-8 weeks with Katie Tali Cleaves,NP or as needed

## 2022-06-23 NOTE — Progress Notes (Signed)
@Patient  ID: Carlos Miranda, male    DOB: [birth year], 61 y.o.   MRN: [id number]  Chief Complaint  Patient presents with   Consult    Pt has sleep study in hand. No coughing, wheezing, SOB. Daytime sleepiness is very bad.    Referring provider: Van Clines, MD  HPI: 61 year old male, never smoker referred for sleep consult.  Past medical history significant for hypertension, HLD, allergic rhinitis, epilepsy, stage IIIc colon cancer, obesity.   TEST/EVENTS:  05/25/2022 NPSG: AHI 14/h; SPO2 low 69%, average 90.3%.  He spent 61.6 minutes less than 88%.  06/23/2022: Today - sleep consult Patient presents today for sleep consult referred by his neurologist.  He has been struggling with daytime fatigue symptoms for a few years now.  They initially thought that it may be related to his epilepsy medications; however, he has been on these since 2020 and did not use to feels tired with them.  He also has a history of snoring and has been told that he stops breathing when he sleeps at night.  He wakes up in the morning feeling poorly rested.  Usually ends up going back to sleep at some point for an hour or 2.  Still feels tired throughout the day.  Denies any drowsy driving, morning headaches, sleep parasomnia/paralysis, cataplexy. His neurologist ordered a sleep study which revealed mild to moderate OSA and nocturnal hypoxemia which was completed on 05/25/2022.  He has not been started on any therapy yet.  He goes to bed around 11 PM-midnight.  Takes 10 to 15 minutes to fall asleep.  He does take over-the-counter melatonin to help with this.  Usually wakes 1-2 times throughout the night and then again in the early morning.  Falls back asleep around 8-8 30 and then gets out of the bed officially in the morning around 930.  Does not drink any caffeine throughout the day.  He is disabled and currently not working.  Feels like his weight is up at least 10 to 15 pounds over the last few years.  He lives at  home by himself.  Previously lived with his wife, who passed away 3 years ago.  He is currently undergoing treatment for stage III colon cancer.  Has a history of hypertension which is well controlled on his antihypertensives.  No history of stroke.  He is a never smoker.  Did previously use chewing tobacco but quit in 2020.  Does not drink any alcohol.  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    Sulfa Antibiotics Other (See Comments)    Unknown reaction     Immunization History  Administered Date(s) Administered   Fluad Quad(high Dose 65+) 09/07/2021   Influenza Split 08/07/2013   Influenza,inj,Quad PF,6+ Mos 09/09/2016, 10/30/2017, 09/01/2018, 11/11/2019, 07/19/2020   Influenza-Unspecified 09/01/2018   Moderna Sars-Covid-2 Vaccination 05/31/2020, 06/29/2020   Tdap 04/02/2019   Zoster Recombinat (Shingrix) 12/10/2020, 06/20/2021   Zoster, Live 09/04/2014    Past Medical History:  Diagnosis Date   Allergy    seasonal allergies   Cancer (HCC)    Colon Cancer   Deafness in left ear    History of meningitis  6 months old   Hyperlipidemia    Hypertension    Port-A-Cath in place 01/24/2021   Seizures (HCC)    11/10/20 current on meds    Tobacco History: Social History   Tobacco Use  Smoking Status Never  Smokeless Tobacco Former   Types: Chew   Quit date: 03/2019   Counseling given: Not Answered   Outpatient Medications Prior to Visit  Medication Sig Dispense Refill   Cenobamate (XCOPRI) 100 MG TABS Take 1 tablet every night 30 tablet 5   ibuprofen (ADVIL) 600 MG tablet Take 1 tablet (600 mg total) by mouth every 8 (eight) hours as needed. 30 tablet 0    LamoTRIgine 300 MG TB24 24 hour tablet Take 1 tablet every night 90 tablet 3   lisinopril (ZESTRIL) 10 MG tablet Take 1 tablet (10 mg total) by mouth daily. 90 tablet 3   MELATONIN PO Take by mouth.     OXcarbazepine ER (OXTELLAR XR) 600 MG TB24 Take 1 tablet every night (take with Oxtellar 300mg  tablet for total of 900mg  every night) 30 tablet 11   pravastatin (PRAVACHOL) 40 MG tablet Take 1 tablet (40 mg total) by mouth daily. 90 tablet 3   tadalafil (CIALIS) 10 MG tablet Take 1 tablet (10 mg total) by mouth every other day as needed for erectile dysfunction. 30 tablet 2   Facility-Administered Medications Prior to Visit  Medication Dose Route Frequency Provider Last Rate Last Admin   heparin lock flush 100 unit/mL  500 Units Intravenous Once [name], MD         Review of Systems:   Constitutional: No weight loss or gain, night sweats, fevers, chills, or lassitude. + Excessive daytime sleepiness HEENT: No headaches, difficulty swallowing, tooth/dental problems, or sore throat. No sneezing, itching, ear ache, or post nasal drip. +nasal congestion (baseline) CV:  No chest pain, orthopnea, PND, swelling in lower extremities, anasarca, dizziness, palpitations, syncope Resp: + Snoring, witnessed nocturnal apneas.  No shortness of breath with exertion or at rest. No excess mucus or change in color of mucus. No productive or non-productive. No hemoptysis. No wheezing.  No chest wall deformity GI:  No heartburn, indigestion, abdominal pain, nausea, vomiting, diarrhea, change in bowel habits, loss of appetite, bloody stools.  GU: No dysuria, change in color of urine, urgency or frequency.  No flank pain, no hematuria  Skin: No rash, lesions, ulcerations MSK:  No joint pain or swelling.  No decreased range of motion.  No back pain. Neuro: No dizziness or lightheadedness.  Psych: No depression or anxiety. Mood stable.     Physical Exam:  BP 122/76 (BP Location: Right Arm, Patient  Position: Sitting)   Pulse 85   Temp 98 F (36.7 C) (Oral)   Ht 5\' 8"  (1.727 m)   Wt 246 lb 3.2 oz (111.7 kg)   SpO2 97%   BMI 37.43 kg/m   GEN: Pleasant, interactive, well-appearing; obese; in no acute distress. HEENT:  Normocephalic and atraumatic. PERRLA. Sclera white. Nasal turbinates pink, moist and patent bilaterally. No rhinorrhea present. Oropharynx pink and moist, without exudate or edema. No lesions, ulcerations, or postnasal drip. Mallampati III NECK:  Supple w/ fair ROM. No JVD present. Normal carotid impulses w/o bruits. Thyroid symmetrical with no goiter or nodules palpated. No lymphadenopathy.   CV: RRR, no m/r/g, no peripheral edema. Pulses intact, +2 bilaterally. No cyanosis, pallor or clubbing. PULMONARY:  Unlabored, regular breathing. Clear bilaterally A&P w/o wheezes/rales/rhonchi. No accessory muscle use. No dullness to percussion.  GI: BS present and normoactive. Soft, non-tender to palpation. No organomegaly or masses detected. No CVA tenderness. MSK: No erythema, warmth or tenderness. Cap refil <2 sec all extrem. No deformities or joint swelling noted.  Neuro: A/Ox3. No focal deficits noted.   Skin: Warm, no lesions or rashe Psych: Normal affect and behavior. Judgement and thought content appropriate.     Lab Results:  CBC    Component Value Date/Time   WBC 5.8 05/09/2022 1231   RBC 5.26 05/09/2022 1231   HGB 16.9 05/09/2022 1231   HGB 14.6 06/06/2021 1420   HCT 49.7 05/09/2022 1231   HCT 43.9 06/06/2021 1420   PLT 167 05/09/2022 1231   PLT 191 06/06/2021 1420   MCV 94.5 05/09/2022 1231   MCV 104 (H) 06/06/2021 1420   MCH 32.1 05/09/2022 1231   MCHC 34.0 05/09/2022 1231   RDW 12.9 05/09/2022 1231   RDW 15.8 (H) 06/06/2021 1420   LYMPHSABS 1.4 05/09/2022 1231   LYMPHSABS 1.6 06/06/2021 1420   MONOABS 0.5 05/09/2022 1231   EOSABS 0.4 05/09/2022 1231   EOSABS 0.1 06/06/2021 1420   BASOSABS 0.1 05/09/2022 1231   BASOSABS 0.1 06/06/2021 1420     BMET    Component Value Date/Time   NA 138 05/09/2022 1155   NA 139 03/31/2020 1534   K 4.4 05/09/2022 1155   CL 109 05/09/2022 1155   CO2 26 05/09/2022 1155   GLUCOSE 102 (H) 05/09/2022 1155   BUN 14 05/09/2022 1155   BUN 11 03/31/2020 1534   CREATININE 1.07 05/09/2022 1155   CALCIUM 8.8 (L) 05/09/2022 1155   GFRNONAA >60 05/09/2022 1155   GFRAA 108 03/31/2020 1534    BNP No results found for: "BNP"   Imaging:  No results found.        No data to display          No results found for: "NITRICOXIDE"      Assessment & Plan:   OSA (obstructive sleep apnea) He has snoring, excessive daytime sleepiness, nocturnal apneic events. BMI 37. History of HTN.  He underwent in lab sleep study, ordered by his neurologist, which revealed mild to moderate OSA with significant oxygen desaturations. He is agreeable to CPAP therapy - order sent for new start CPAP 5-15 cmH2O, mask of choice, and heated humidification. We will obtain ONO on CPAP once he is established to determine O2 requirement.    - discussed how weight can impact sleep and risk for sleep disordered breathing - discussed options to assist with weight loss: combination of diet modification, cardiovascular and strength training exercises   - had an extensive discussion regarding the adverse health consequences related to untreated sleep disordered breathing - specifically discussed the risks for hypertension, coronary artery disease, cardiac dysrhythmias, cerebrovascular disease, and diabetes - lifestyle modification discussed   - discussed how sleep disruption can increase risk of accidents, particularly when driving - safe driving practices were discussed  Patient Instructions  Start to use CPAP every night, minimum of 4-6 hours a night.  Change equipment every 30 days or as directed by DME. Wash your tubing with warm soap and water daily, hang to dry. Wash humidifier portion weekly.  Be aware of reduced  alertness and do not drive or operate heavy machinery if experiencing this or drowsiness.  Healthy weight management discussed.  Avoid or decrease alcohol consumption and medications that make you more sleepy, if possible. Notify if persistent daytime sleepiness occurs even with consistent use of CPAP.  We discussed how untreated sleep apnea puts an individual at risk for cardiac arrhthymias, pulm HTN, DM, stroke and increases their risk for daytime accidents. We also briefly reviewed treatment options including weight loss, side sleeping position, oral appliance, CPAP therapy or referral to ENT for possible surgical options  Follow up in 6-8 weeks with Florentina Addison Kynsli Haapala,NP or as needed     Obesity Healthy weight loss recommended  Excessive daytime sleepiness Likely multifactorial related to epilepsy medications and untreated OSA. Hopefully, CPAP therapy will lessen his symptoms. Instructed to be cautious of drowsy driving and avoid driving if sleepy.  Nocturnal hypoxia He spent 61.6 minutes less than 88% during his NPSG; average was 90.3 and SpO2 low was 69%. He will need ONO after he is established on CPAP to determine if he requires supplemental O2.    I spent 42 minutes of dedicated to the care of this patient on the date of this encounter to include pre-visit review of records, face-to-face time with the patient discussing conditions above, post visit ordering of testing, clinical documentation with the electronic health record, making appropriate referrals as documented, and communicating necessary findings to members of the patients care team.  Noemi Chapel, NP 06/23/2022  Pt aware and understands NP's role.

## 2022-06-23 NOTE — Patient Instructions (Addendum)
Start to use CPAP every night, minimum of 4-6 hours a night.  Change equipment every 30 days or as directed by DME. Wash your tubing with warm soap and water daily, hang to dry. Wash humidifier portion weekly.  Be aware of reduced alertness and do not drive or operate heavy machinery if experiencing this or drowsiness.  Healthy weight management discussed.  Avoid or decrease alcohol consumption and medications that make you more sleepy, if possible. Notify if persistent daytime sleepiness occurs even with consistent use of CPAP.  We discussed how untreated sleep apnea puts an individual at risk for cardiac arrhthymias, pulm HTN, DM, stroke and increases their risk for daytime accidents. We also briefly reviewed treatment options including weight loss, side sleeping position, oral appliance, CPAP therapy or referral to ENT for possible surgical options  Follow up in 6-8 weeks with Katie Aina Rossbach,NP or as needed

## 2022-06-23 NOTE — Assessment & Plan Note (Signed)
Healthy weight loss recommended

## 2022-06-23 NOTE — Assessment & Plan Note (Signed)
Likely multifactorial related to epilepsy medications and untreated OSA. Hopefully, CPAP therapy will lessen his symptoms. Instructed to be cautious of drowsy driving and avoid driving if sleepy.

## 2022-06-25 NOTE — Progress Notes (Signed)
Reviewed and agree with assessment/plan.   Caleen Taaffe, MD [facility] 06/25/2022, 4:15 PM Pager:  [phone]  

## 2022-07-10 ENCOUNTER — Telehealth: Payer: Self-pay | Admitting: Family Medicine

## 2022-07-10 DIAGNOSIS — I10 Essential (primary) hypertension: Secondary | ICD-10-CM

## 2022-07-10 DIAGNOSIS — G4733 Obstructive sleep apnea (adult) (pediatric): Secondary | ICD-10-CM | POA: Diagnosis not present

## 2022-07-10 NOTE — Telephone Encounter (Signed)
Pt asking for lisinopril (ZESTRIL) 10 MG tablet to be sent to [facility] including all refills. Please call back when done.

## 2022-07-11 MED ORDER — LISINOPRIL 10 MG PO TABS: 10.0000 mg | ORAL_TABLET | Freq: Every day | ORAL | 3 refills | Status: DC

## 2022-07-11 NOTE — Telephone Encounter (Signed)
Meds refilled.

## 2022-07-12 ENCOUNTER — Other Ambulatory Visit: Payer: Self-pay

## 2022-07-12 DIAGNOSIS — I10 Essential (primary) hypertension: Secondary | ICD-10-CM

## 2022-07-12 MED ORDER — LISINOPRIL 10 MG PO TABS: 10.0000 mg | ORAL_TABLET | Freq: Every day | ORAL | 3 refills | Status: DC

## 2022-07-12 NOTE — Telephone Encounter (Signed)
Refills were sent to wrong pharmacy.   Need to be sent to [facility] in [place].

## 2022-07-12 NOTE — Telephone Encounter (Signed)
Sent to [facility]

## 2022-07-28 ENCOUNTER — Ambulatory Visit: Payer: Self-pay | Admitting: Neurology

## 2022-08-09 DIAGNOSIS — G4733 Obstructive sleep apnea (adult) (pediatric): Secondary | ICD-10-CM | POA: Diagnosis not present

## 2022-08-14 ENCOUNTER — Other Ambulatory Visit: Payer: Self-pay | Admitting: Neurology

## 2022-08-14 DIAGNOSIS — G4733 Obstructive sleep apnea (adult) (pediatric): Secondary | ICD-10-CM | POA: Diagnosis not present

## 2022-08-21 ENCOUNTER — Inpatient Hospital Stay: Payer: Medicaid Other | Attending: Hematology

## 2022-08-21 DIAGNOSIS — R519 Headache, unspecified: Secondary | ICD-10-CM | POA: Insufficient documentation

## 2022-08-21 DIAGNOSIS — Z880 Allergy status to penicillin: Secondary | ICD-10-CM | POA: Insufficient documentation

## 2022-08-21 DIAGNOSIS — G629 Polyneuropathy, unspecified: Secondary | ICD-10-CM | POA: Diagnosis not present

## 2022-08-21 DIAGNOSIS — Z87891 Personal history of nicotine dependence: Secondary | ICD-10-CM | POA: Diagnosis not present

## 2022-08-21 DIAGNOSIS — Z888 Allergy status to other drugs, medicaments and biological substances status: Secondary | ICD-10-CM | POA: Insufficient documentation

## 2022-08-21 DIAGNOSIS — Z79899 Other long term (current) drug therapy: Secondary | ICD-10-CM | POA: Insufficient documentation

## 2022-08-21 DIAGNOSIS — Z8249 Family history of ischemic heart disease and other diseases of the circulatory system: Secondary | ICD-10-CM | POA: Insufficient documentation

## 2022-08-21 DIAGNOSIS — Z882 Allergy status to sulfonamides status: Secondary | ICD-10-CM | POA: Insufficient documentation

## 2022-08-21 DIAGNOSIS — R200 Anesthesia of skin: Secondary | ICD-10-CM | POA: Diagnosis not present

## 2022-08-21 DIAGNOSIS — C182 Malignant neoplasm of ascending colon: Secondary | ICD-10-CM | POA: Insufficient documentation

## 2022-08-21 DIAGNOSIS — I10 Essential (primary) hypertension: Secondary | ICD-10-CM | POA: Diagnosis not present

## 2022-08-21 DIAGNOSIS — R197 Diarrhea, unspecified: Secondary | ICD-10-CM | POA: Insufficient documentation

## 2022-08-21 DIAGNOSIS — Z8261 Family history of arthritis: Secondary | ICD-10-CM | POA: Diagnosis not present

## 2022-08-21 DIAGNOSIS — E785 Hyperlipidemia, unspecified: Secondary | ICD-10-CM | POA: Diagnosis not present

## 2022-08-21 DIAGNOSIS — Z9049 Acquired absence of other specified parts of digestive tract: Secondary | ICD-10-CM | POA: Diagnosis not present

## 2022-08-21 DIAGNOSIS — Z8269 Family history of other diseases of the musculoskeletal system and connective tissue: Secondary | ICD-10-CM | POA: Diagnosis not present

## 2022-08-21 LAB — COMPREHENSIVE METABOLIC PANEL
ALT: 22 U/L (ref 0–44)
AST: 23 U/L (ref 15–41)
Albumin: 3.9 g/dL (ref 3.5–5.0)
Alkaline Phosphatase: 103 U/L (ref 38–126)
Anion gap: 7 (ref 5–15)
BUN: 13 mg/dL (ref 8–23)
CO2: 29 mmol/L (ref 22–32)
Calcium: 9.1 mg/dL (ref 8.9–10.3)
Chloride: 106 mmol/L (ref 98–111)
Creatinine, Ser: 0.94 mg/dL (ref 0.61–1.24)
GFR, Estimated: >60 mL/min (ref >60)
Glucose, Bld: 122 mg/dL — ABNORMAL HIGH (ref 70–99)
Potassium: 4.8 mmol/L (ref 3.5–5.1)
Sodium: 142 mmol/L (ref 135–145)
Total Bilirubin: 0.5 mg/dL (ref 0.3–1.2)
Total Protein: 7 g/dL (ref 6.5–8.1)

## 2022-08-21 LAB — CBC WITH DIFFERENTIAL/PLATELET
Abs Immature Granulocytes: 0.01 10*3/uL (ref 0.00–0.07)
Basophils Absolute: 0 10*3/uL (ref 0.0–0.1)
Basophils Relative: 1 %
Eosinophils Absolute: 0.4 10*3/uL (ref 0.0–0.5)
Eosinophils Relative: 9 %
HCT: 47.8 % (ref 39.0–52.0)
Hemoglobin: 16.4 g/dL (ref 13.0–17.0)
Immature Granulocytes: 0 %
Lymphocytes Relative: 35 %
Lymphs Abs: 1.3 10*3/uL (ref 0.7–4.0)
MCH: 33.5 pg (ref 26.0–34.0)
MCHC: 34.3 g/dL (ref 30.0–36.0)
MCV: 97.8 fL (ref 80.0–100.0)
Monocytes Absolute: 0.3 10*3/uL (ref 0.1–1.0)
Monocytes Relative: 7 %
Neutro Abs: 1.9 10*3/uL (ref 1.7–7.7)
Neutrophils Relative %: 48 %
Platelets: 191 10*3/uL (ref 150–400)
RBC: 4.89 MIL/uL (ref 4.22–5.81)
RDW: 13.4 % (ref 11.5–15.5)
WBC: 3.8 10*3/uL — ABNORMAL LOW (ref 4.0–10.5)
nRBC: 0 % (ref 0.0–0.2)

## 2022-08-22 ENCOUNTER — Encounter: Payer: Self-pay | Admitting: Neurology

## 2022-08-22 ENCOUNTER — Other Ambulatory Visit: Payer: Medicaid Other

## 2022-08-22 ENCOUNTER — Ambulatory Visit: Payer: Medicaid Other | Admitting: Neurology

## 2022-08-22 VITALS — BP 139/82 | HR 77 | Ht 68.0 in | Wt 246.2 lb

## 2022-08-22 DIAGNOSIS — G40219 Localization-related (focal) (partial) symptomatic epilepsy and epileptic syndromes with complex partial seizures, intractable, without status epilepticus: Secondary | ICD-10-CM | POA: Diagnosis not present

## 2022-08-22 LAB — CEA: CEA: 1.7 ng/mL (ref 0.0–4.7)

## 2022-08-22 MED ORDER — OXTELLAR XR 600 MG PO TB24: ORAL_TABLET | ORAL | 3 refills | Status: DC

## 2022-08-22 MED ORDER — LAMOTRIGINE ER 300 MG PO TB24: ORAL_TABLET | ORAL | 3 refills | Status: DC
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
## 2022-08-29 ENCOUNTER — Inpatient Hospital Stay (HOSPITAL_BASED_OUTPATIENT_CLINIC_OR_DEPARTMENT_OTHER): Payer: Medicaid Other | Admitting: Hematology

## 2022-08-29 VITALS — BP 115/87 | HR 92 | Temp 98.6°F | Resp 17 | Ht 68.0 in | Wt 246.8 lb

## 2022-08-29 DIAGNOSIS — C182 Malignant neoplasm of ascending colon: Secondary | ICD-10-CM

## 2022-08-29 NOTE — Patient Instructions (Addendum)
[facility]  Discharge Instructions  You were seen and examined today by Dr. Delton Coombes.  Dr. Delton Coombes discussed your most recent lab work which revealed everything looks good.  Follow-up as scheduled in 3 months with labs and scan prior.   Thank you for choosing [facility] to provide your oncology and hematology care.   To afford each patient quality time with our provider, please arrive at least 15 minutes before your scheduled appointment time. You may need to reschedule your appointment if you arrive late (10 or more minutes). Arriving late affects you and other patients whose appointments are after yours.  Also, if you miss three or more appointments without notifying the office, you may be dismissed from the clinic at the provider's discretion.    Again, thank you for choosing [facility].  Our hope is that these requests will decrease the amount of time that you wait before being seen by our physicians.   If you have a lab appointment with the [facility] please come in thru the Main Entrance and check in at the main information desk.           _____________________________________________________________  Should you have questions after your visit to [facility], please contact our office at [phone] and follow the prompts.  Our office hours are 8:00 a.m. to 4:30 p.m. Monday - Thursday and 8:00 a.m. to 2:30 p.m. Friday.  Please note that voicemails left after 4:00 p.m. may not be returned until the following business day.  We are closed weekends and all major holidays.  You do have access to a nurse 24-7, just call the main number to the clinic [phone] and do not press any options, hold on the line and a nurse will answer the phone.    For prescription refill requests, have your pharmacy contact our office and allow 72 hours.    Masks are optional in the cancer centers. If you would like  for your care team to wear a mask while they are taking care of you, please let them know. You may have one support person who is at least 61 years old accompany you for your appointments.
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
## 2022-09-09 DIAGNOSIS — G4733 Obstructive sleep apnea (adult) (pediatric): Secondary | ICD-10-CM | POA: Diagnosis not present

## 2022-09-27 ENCOUNTER — Ambulatory Visit (INDEPENDENT_AMBULATORY_CARE_PROVIDER_SITE_OTHER): Payer: Medicaid Other | Admitting: Nurse Practitioner

## 2022-09-27 ENCOUNTER — Encounter: Payer: Self-pay | Admitting: Nurse Practitioner

## 2022-09-27 VITALS — BP 136/88 | HR 86 | Ht 68.0 in | Wt 246.6 lb

## 2022-09-27 DIAGNOSIS — Z6837 Body mass index (BMI) 37.0-37.9, adult: Secondary | ICD-10-CM

## 2022-09-27 DIAGNOSIS — J310 Chronic rhinitis: Secondary | ICD-10-CM

## 2022-09-27 DIAGNOSIS — G4734 Idiopathic sleep related nonobstructive alveolar hypoventilation: Secondary | ICD-10-CM | POA: Diagnosis not present

## 2022-09-27 DIAGNOSIS — G4733 Obstructive sleep apnea (adult) (pediatric): Secondary | ICD-10-CM

## 2022-09-27 DIAGNOSIS — R0609 Other forms of dyspnea: Secondary | ICD-10-CM | POA: Diagnosis not present

## 2022-09-27 MED ORDER — FLUTICASONE PROPIONATE 50 MCG/ACT NA SUSP: 1.0000 | Freq: Every day | NASAL | 2 refills | Status: DC

## 2022-09-27 NOTE — Progress Notes (Signed)
@Patient  ID: Carlos Miranda, male    DOB: 11/11/1960, 61 y.o.   MRN: [id number]  Chief Complaint  Patient presents with   Follow-up    Pt is doing well, no concerns with cpap machine    Referring provider: Dettinger, Elige Radon, MD  HPI: 61 year old male, never smoker follow for OSA on CPAP.  Past medical history significant for hypertension, HLD, allergic rhinitis, epilepsy, stage IIIc colon cancer, obesity.   TEST/EVENTS:  05/12/2021 CT chest: 8 mm RUL nodule, unchanged. 4 mm LLL nodule, unchanged. 6 mm LLL nodule unchanged. All are stable and nonspecific 05/25/2022 NPSG: AHI 14/h; SPO2 low 69%, average 90.3%.  He spent 61.6 minutes less than 88%.  06/23/2022: OV with Rinaldo Macqueen NP for sleep consult referred by his neurologist.  He has been struggling with daytime fatigue symptoms for a few years now.  They initially thought that it may be related to his epilepsy medications; however, he has been on these since 2020 and did not use to feels tired with them.  He also has a history of snoring and has been told that he stops breathing when he sleeps at night.  He wakes up in the morning feeling poorly rested.  Usually ends up going back to sleep at some point for an hour or 2.  Still feels tired throughout the day.  Denies any drowsy driving, morning headaches, sleep parasomnia/paralysis, cataplexy. His neurologist ordered a sleep study which revealed mild to moderate OSA and nocturnal hypoxemia which was completed on 05/25/2022.  He has not been started on any therapy yet.  He goes to bed around 11 PM-midnight.  Takes 10 to 15 minutes to fall asleep.  He does take over-the-counter melatonin to help with this.  Usually wakes 1-2 times throughout the night and then again in the early morning.  Falls back asleep around 8-8 30 and then gets out of the bed officially in the morning around 930.  Does not drink any caffeine throughout the day.  He is disabled and currently not working.  Feels like his weight is  up at least 10 to 15 pounds over the last few years.  He lives at home by himself.  Previously lived with his wife, who passed away 3 years ago.  He is currently undergoing treatment for stage III colon cancer.  Has a history of hypertension which is well controlled on his antihypertensives.  No history of stroke.  He is a never smoker.  Did previously use chewing tobacco but quit in 2020.  Does not drink any alcohol. CPAP 5-15 cmH2O ordered.   09/27/2022: Today - follow up Patient presents today for follow up after being started on CPAP therapy. He wears it most every night. Occasionally wakes up with a dry mouth; otherwise, hasn't had any issues. Wakes feeling better rested. Has a little more energy during the day; still some residual fatigue. He denies drowsy driving, morning headaches, sleep parasomnias/paralysis.   He also wanted to discuss a concern of his regarding his breathing. He has been struggling with shortness of breath with exertion for at least the past year, if not a little bit longer. It can be with strenuous activity or just with bending over to tie his shoes. He also has nasal congestion, year round but worse some days than others. Occasionally has some clear drainage. He will notice that his nose makes a whistling sound sometimes. No wheezing otherwise. He denies any cough, chest pain, palpitations, orthopnea, PND, leg swelling, chest congestion,  lightheadedness or dizziness. He has been less active over the past few years since his wife died. They used to walk frequently together and between her passing and his cancer diagnosis, he hasn't really been doing much. He's gained around 30 pounds over the last year and a half as well.  He is a never smoker. No history of childhood asthma. Previous CT chest from 2022 did not show any evidence of pulmonary disease to explain his symptoms. No occupational or environmental exposures.   Allergies  Allergen Reactions   Penicillins Swelling      Has patient had a PCN reaction causing    Furosemide Other (See Comments)    Advised to avoid this medication due to condition from childhood (Meninigitis)   Streptomycin Other (See Comments)    Avoid streptomycin, neomycin, and kanamycin due to deafness in one ear from meninigitis    Sulfa Antibiotics Other (See Comments)    Unknown reaction     Immunization History  Administered Date(s) Administered   Fluad Quad(high Dose 65+) 09/07/2021   Influenza Inj Mdck Quad Pf 07/13/2022   Influenza Split 08/07/2013   Influenza,inj,Quad PF,6+ Mos 09/09/2016, 10/30/2017, 09/01/2018, 11/11/2019, 07/19/2020   Influenza-Unspecified 09/01/2018   Moderna Sars-Covid-2 Vaccination 05/31/2020, 06/29/2020   Tdap 04/02/2019   Zoster Recombinat (Shingrix) 12/10/2020, 06/20/2021   Zoster, Live 09/04/2014    Past Medical History:  Diagnosis Date   Allergy    seasonal allergies   Cancer ([facility])    Colon Cancer   Deafness in left ear    History of meningitis 6 months old   Hyperlipidemia    Hypertension    Port-A-Cath in place 01/24/2021   Seizures ([facility])    11/10/20 current on meds    Tobacco History: Social History   Tobacco Use  Smoking Status Never  Smokeless Tobacco Former   Types: Chew   Quit date: 03/2019   Counseling given: Not Answered   Outpatient Medications Prior to Visit  Medication Sig Dispense Refill   Cenobamate (XCOPRI) 100 MG TABS Take 1 tablet every night 30 tablet 5   ibuprofen (ADVIL) 600 MG tablet Take 1 tablet (600 mg total) by mouth every 8 (eight) hours as needed. 30 tablet 0   LamoTRIgine 300 MG TB24 24 hour tablet Take 1 tablet every night 90 tablet 3   lisinopril (ZESTRIL) 10 MG tablet Take 1 tablet (10 mg total) by mouth daily. 90 tablet 3   MELATONIN PO Take by mouth.     OXcarbazepine ER (OXTELLAR XR) 600 MG TB24 TAKE ONE TABLET EVERY EVENING 90 tablet 3   pravastatin (PRAVACHOL) 40 MG tablet Take 1 tablet (40 mg total) by mouth daily. 90 tablet 3    tadalafil (CIALIS) 10 MG tablet Take 1 tablet (10 mg total) by mouth every other day as needed for erectile dysfunction. 30 tablet 2   Facility-Administered Medications Prior to Visit  Medication Dose Route Frequency Provider Last Rate Last Admin   heparin lock flush 100 unit/mL  500 Units Intravenous Once [name], MD         Review of Systems:   Constitutional: No weight loss or gain, night sweats, fevers, chills, or lassitude. +daytime fatigue (improving) HEENT: No headaches, difficulty swallowing, tooth/dental problems, or sore throat. No sneezing, itching, ear ache, or post nasal drip. +nasal congestion (baseline) CV:  No chest pain, orthopnea, PND, swelling in lower extremities, anasarca, dizziness, palpitations, syncope Resp: +shortness of breath with exertion. No excess mucus or change in color of mucus.  No productive or non-productive. No hemoptysis. No wheezing.  No chest wall deformity GI:  No heartburn, indigestion, abdominal pain, nausea, vomiting, diarrhea, change in bowel habits, loss of appetite, bloody stools.  GU: No nocturia MSK:  No joint pain or swelling.  No decreased range of motion.  No back pain. Neuro: No dizziness or lightheadedness.  Psych: No depression or anxiety. Mood stable.     Physical Exam:  BP 136/88 (BP Location: Right Arm, Patient Position: Sitting, Cuff Size: Normal)   Pulse 86   Ht 5\' 8"  (1.727 m)   Wt 246 lb 9.6 oz (111.9 kg)   SpO2 95%   BMI 37.50 kg/m   GEN: Pleasant, interactive, well-appearing; obese; in no acute distress. HEENT:  Normocephalic and atraumatic. PERRLA. Sclera white. Nasal turbinates pink, moist and patent bilaterally. No rhinorrhea present. Oropharynx pink and moist, without exudate or edema. No lesions, ulcerations, or postnasal drip. Mallampati III NECK:  Supple w/ fair ROM. No JVD present. Normal carotid impulses w/o bruits. Thyroid symmetrical with no goiter or nodules palpated. No lymphadenopathy.   CV:  RRR, no m/r/g, no peripheral edema. Pulses intact, +2 bilaterally. No cyanosis, pallor or clubbing. PULMONARY:  Unlabored, regular breathing. Clear bilaterally A&P w/o wheezes/rales/rhonchi. No accessory muscle use. No dullness to percussion. GI: BS present and normoactive. Soft, non-tender to palpation. No organomegaly or masses detected. MSK: No erythema, warmth or tenderness. Cap refil <2 sec all extrem. No deformities or joint swelling noted.  Neuro: A/Ox3. No focal deficits noted.   Skin: Warm, no lesions or rashe Psych: Normal affect and behavior. Judgement and thought content appropriate.     Lab Results:  CBC    Component Value Date/Time   WBC 3.8 (L) 08/21/2022 1123   RBC 4.89 08/21/2022 1123   HGB 16.4 08/21/2022 1123   HGB 14.6 06/06/2021 1420   HCT 47.8 08/21/2022 1123   HCT 43.9 06/06/2021 1420   PLT 191 08/21/2022 1123   PLT 191 06/06/2021 1420   MCV 97.8 08/21/2022 1123   MCV 104 (H) 06/06/2021 1420   MCH 33.5 08/21/2022 1123   MCHC 34.3 08/21/2022 1123   RDW 13.4 08/21/2022 1123   RDW 15.8 (H) 06/06/2021 1420   LYMPHSABS 1.3 08/21/2022 1123   LYMPHSABS 1.6 06/06/2021 1420   MONOABS 0.3 08/21/2022 1123   EOSABS 0.4 08/21/2022 1123   EOSABS 0.1 06/06/2021 1420   BASOSABS 0.0 08/21/2022 1123   BASOSABS 0.1 06/06/2021 1420    BMET    Component Value Date/Time   NA 142 08/21/2022 1123   NA 139 03/31/2020 1534   K 4.8 08/21/2022 1123   CL 106 08/21/2022 1123   CO2 29 08/21/2022 1123   GLUCOSE 122 (H) 08/21/2022 1123   BUN 13 08/21/2022 1123   BUN 11 03/31/2020 1534   CREATININE 0.94 08/21/2022 1123   CALCIUM 9.1 08/21/2022 1123   GFRNONAA >60 08/21/2022 1123   GFRAA 108 03/31/2020 1534    BNP No results found for: "BNP"   Imaging:  No results found.        No data to display          No results found for: "NITRICOXIDE"      Assessment & Plan:   OSA (obstructive sleep apnea) Recently started on CPAP therapy. Download  unavailable today and his SD card had no data on it. He is getting a SD card error on his machine but was told by [employer] that they were receiving data from his device so  he didn't need to worry about this. Attempted to contact [employer] - initial contact stated that they didn't have any information and he would need to bring the card in. Reached out to second contact - awaiting call back. If they are unable to provide [place] with data from his machine, he will take his machine and card up to them to troubleshoot. He reports excellent compliance and good benefit from use. Cautioned on safe driving practices.   Patient Instructions  Continue to use CPAP every night, minimum of 4-6 hours a night.  Change equipment every 30 days or as directed by DME. Wash your tubing with warm soap and water daily, hang to dry. Wash humidifier portion weekly.  Be aware of reduced alertness and do not drive or operate heavy machinery if experiencing this or drowsiness.  Exercise encouraged, as tolerated. Notify if persistent daytime sleepiness occurs even with consistent use of CPAP.  Saline nasal gel in each nostril at bedtime   Overnight oximetry study on CPAP ordered - someone will contact you for scheduling  Flonase nasal spray 2 sprays each nostril daily for nasal congestion Saline nasal spray 2-3 times a day as needed for nasal congestion   Pulmonary function testing   Work on increasing your activity level - 150 min/week   Follow up after pulmonary function testing with Dr. Wynona Neat, new patient 30 min slot. If symptoms worsen, please contact office for sooner follow up or seek emergency care.    Dyspnea on exertion Unclear etiology. Suspect this is multifactorial related to obesity, deconditioning, and his cancer diagnosis. We did review potential causes including cardiac, pulmonary, and sinusitis. Plan to obtain pulmonary function testing for further evaluation. CT chest from 2022 without evidence of ILD or  underlying lung disease that would explain his symptoms. Encouraged him to work on graded exercises. Healthy weight loss encouraged. He will also start a intranasal steroid to target his sinus symptoms and see if he has any improvement in his breathing. Otherwise, if PFTs are unrevealing, could consider echocardiogram.   Obesity Increased weight gain of 30+ lb in the last 1-2 years. BMI 37. Healthy weight loss encouraged.   Nocturnal hypoxia Desaturations on previous HST with SpO2 low 69% and 61.6 min <88%. ONO ordered on room air with CPAP to rule out need for supplemental O2.     I spent 35 minutes of dedicated to the care of this patient on the date of this encounter to include pre-visit review of records, face-to-face time with the patient discussing conditions above, post visit ordering of testing, clinical documentation with the electronic health record, making appropriate referrals as documented, and communicating necessary findings to members of the patients care team.  Noemi Chapel, NP 09/27/2022  Pt aware and understands NP's role.

## 2022-09-27 NOTE — Assessment & Plan Note (Signed)
Increased weight gain of 30+ lb in the last 1-2 years. BMI 37. Healthy weight loss encouraged.

## 2022-09-27 NOTE — Patient Instructions (Addendum)
Continue to use CPAP every night, minimum of 4-6 hours a night.  Change equipment every 30 days or as directed by DME. Wash your tubing with warm soap and water daily, hang to dry. Wash humidifier portion weekly.  Be aware of reduced alertness and do not drive or operate heavy machinery if experiencing this or drowsiness.  Exercise encouraged, as tolerated. Notify if persistent daytime sleepiness occurs even with consistent use of CPAP.  Saline nasal gel in each nostril at bedtime   Overnight oximetry study on CPAP ordered - someone will contact you for scheduling  Flonase nasal spray 2 sprays each nostril daily for nasal congestion Saline nasal spray 2-3 times a day as needed for nasal congestion   Pulmonary function testing   Work on increasing your activity level - 150 min/week   Follow up after pulmonary function testing with Dr. Ander Slade, new patient 30 min slot. If symptoms worsen, please contact office for sooner follow up or seek emergency care.

## 2022-09-27 NOTE — Assessment & Plan Note (Signed)
Desaturations on previous HST with SpO2 low 69% and 61.6 min <88%. ONO ordered on room air with CPAP to rule out need for supplemental O2.

## 2022-09-27 NOTE — Assessment & Plan Note (Signed)
Recently started on CPAP therapy. Download unavailable today and his SD card had no data on it. He is getting a SD card error on his machine but was told by [employer] that they were receiving data from his device so he didn't need to worry about this. Attempted to contact [employer] - initial contact stated that they didn't have any information and he would need to bring the card in. Reached out to second contact - awaiting call back. If they are unable to provide [place] with data from his machine, he will take his machine and card up to them to troubleshoot. He reports excellent compliance and good benefit from use. Cautioned on safe driving practices.   Patient Instructions  Continue to use CPAP every night, minimum of 4-6 hours a night.  Change equipment every 30 days or as directed by DME. Wash your tubing with warm soap and water daily, hang to dry. Wash humidifier portion weekly.  Be aware of reduced alertness and do not drive or operate heavy machinery if experiencing this or drowsiness.  Exercise encouraged, as tolerated. Notify if persistent daytime sleepiness occurs even with consistent use of CPAP.  Saline nasal gel in each nostril at bedtime   Overnight oximetry study on CPAP ordered - someone will contact you for scheduling  Flonase nasal spray 2 sprays each nostril daily for nasal congestion Saline nasal spray 2-3 times a day as needed for nasal congestion   Pulmonary function testing   Work on increasing your activity level - 150 min/week   Follow up after pulmonary function testing with Dr. Ander Slade, new patient 30 min slot. If symptoms worsen, please contact office for sooner follow up or seek emergency care.

## 2022-09-27 NOTE — Assessment & Plan Note (Signed)
Unclear etiology. Suspect this is multifactorial related to obesity, deconditioning, and his cancer diagnosis. We did review potential causes including cardiac, pulmonary, and sinusitis. Plan to obtain pulmonary function testing for further evaluation. CT chest from 2022 without evidence of ILD or underlying lung disease that would explain his symptoms. Encouraged him to work on graded exercises. Healthy weight loss encouraged. He will also start a intranasal steroid to target his sinus symptoms and see if he has any improvement in his breathing. Otherwise, if PFTs are unrevealing, could consider echocardiogram.

## 2022-10-09 DIAGNOSIS — G4733 Obstructive sleep apnea (adult) (pediatric): Secondary | ICD-10-CM | POA: Diagnosis not present

## 2022-11-08 ENCOUNTER — Ambulatory Visit (INDEPENDENT_AMBULATORY_CARE_PROVIDER_SITE_OTHER): Payer: Medicaid Other | Admitting: Pulmonary Disease

## 2022-11-08 DIAGNOSIS — R0609 Other forms of dyspnea: Secondary | ICD-10-CM | POA: Diagnosis not present

## 2022-11-08 LAB — PULMONARY FUNCTION TEST
DL/VA % pred: 134 %
DL/VA: 5.7 ml/min/mmHg/L
DLCO cor % pred: 118 %
DLCO cor: 30.46 ml/min/mmHg
DLCO unc % pred: 118 %
DLCO unc: 30.46 ml/min/mmHg
FEF 25-75 Post: 2.59 L/sec
FEF 25-75 Pre: 1.73 L/sec
FEF2575-%Change-Post: 49 %
FEF2575-%Pred-Post: 94 %
FEF2575-%Pred-Pre: 63 %
FEV1-%Change-Post: 8 %
FEV1-%Pred-Post: 78 %
FEV1-%Pred-Pre: 72 %
FEV1-Post: 2.62 L
FEV1-Pre: 2.42 L
FEV1FVC-%Change-Post: 0 %
FEV1FVC-%Pred-Pre: 98 %
FEV6-%Change-Post: 7 %
FEV6-%Pred-Post: 83 %
FEV6-%Pred-Pre: 77 %
FEV6-Post: 3.49 L
FEV6-Pre: 3.25 L
FEV6FVC-%Change-Post: -1 %
FEV6FVC-%Pred-Post: 103 %
FEV6FVC-%Pred-Pre: 105 %
FVC-%Change-Post: 9 %
FVC-%Pred-Post: 80 %
FVC-%Pred-Pre: 73 %
FVC-Post: 3.55 L
FVC-Pre: 3.25 L
Post FEV1/FVC ratio: 74 %
Post FEV6/FVC ratio: 99 %
Pre FEV1/FVC ratio: 74 %
Pre FEV6/FVC Ratio: 100 %
RV % pred: 92 %
RV: 1.99 L
TLC % pred: 83 %
TLC: 5.5 L

## 2022-11-08 NOTE — Progress Notes (Signed)
Full PFT performed today. °

## 2022-11-08 NOTE — Patient Instructions (Signed)
Full PFT performed today. °

## 2022-11-09 ENCOUNTER — Ambulatory Visit (INDEPENDENT_AMBULATORY_CARE_PROVIDER_SITE_OTHER): Payer: Medicaid Other | Admitting: Pulmonary Disease

## 2022-11-09 ENCOUNTER — Encounter: Payer: Self-pay | Admitting: Pulmonary Disease

## 2022-11-09 VITALS — BP 124/76 | HR 82 | Ht 68.0 in | Wt 247.0 lb

## 2022-11-09 DIAGNOSIS — G4733 Obstructive sleep apnea (adult) (pediatric): Secondary | ICD-10-CM

## 2022-11-09 DIAGNOSIS — R0609 Other forms of dyspnea: Secondary | ICD-10-CM

## 2022-11-09 DIAGNOSIS — Z6837 Body mass index (BMI) 37.0-37.9, adult: Secondary | ICD-10-CM | POA: Diagnosis not present
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
## 2022-11-09 NOTE — Patient Instructions (Addendum)
  I will see you back in about 3 months  Continue using your CPAP regularly  Graded exercises as tolerated  Your breathing study shows mild obstruction-I do believe regular exercises will help  Weight loss as able  Call with significant concerns

## 2022-11-22 ENCOUNTER — Encounter: Payer: Self-pay | Admitting: *Deleted

## 2022-11-22 ENCOUNTER — Inpatient Hospital Stay: Payer: Medicaid Other | Attending: Hematology

## 2022-11-22 ENCOUNTER — Ambulatory Visit (HOSPITAL_COMMUNITY)
Admission: RE | Admit: 2022-11-22 | Discharge: 2022-11-22 | Disposition: A | Discharge status: Home / Self Care | Payer: Medicaid Other | Source: Ambulatory Visit | Attending: Hematology | Admitting: Hematology

## 2022-11-22 DIAGNOSIS — Z888 Allergy status to other drugs, medicaments and biological substances status: Secondary | ICD-10-CM | POA: Insufficient documentation

## 2022-11-22 DIAGNOSIS — I10 Essential (primary) hypertension: Secondary | ICD-10-CM | POA: Diagnosis not present

## 2022-11-22 DIAGNOSIS — G629 Polyneuropathy, unspecified: Secondary | ICD-10-CM | POA: Diagnosis not present

## 2022-11-22 DIAGNOSIS — Z8261 Family history of arthritis: Secondary | ICD-10-CM | POA: Insufficient documentation

## 2022-11-22 DIAGNOSIS — C182 Malignant neoplasm of ascending colon: Secondary | ICD-10-CM | POA: Insufficient documentation

## 2022-11-22 DIAGNOSIS — Z79899 Other long term (current) drug therapy: Secondary | ICD-10-CM | POA: Diagnosis not present

## 2022-11-22 DIAGNOSIS — Z882 Allergy status to sulfonamides status: Secondary | ICD-10-CM | POA: Insufficient documentation

## 2022-11-22 DIAGNOSIS — E785 Hyperlipidemia, unspecified: Secondary | ICD-10-CM | POA: Insufficient documentation

## 2022-11-22 DIAGNOSIS — Z880 Allergy status to penicillin: Secondary | ICD-10-CM | POA: Insufficient documentation

## 2022-11-22 DIAGNOSIS — Z8249 Family history of ischemic heart disease and other diseases of the circulatory system: Secondary | ICD-10-CM | POA: Insufficient documentation

## 2022-11-22 DIAGNOSIS — Z87891 Personal history of nicotine dependence: Secondary | ICD-10-CM | POA: Insufficient documentation

## 2022-11-22 DIAGNOSIS — K7689 Other specified diseases of liver: Secondary | ICD-10-CM | POA: Diagnosis not present

## 2022-11-22 DIAGNOSIS — K5730 Diverticulosis of large intestine without perforation or abscess without bleeding: Secondary | ICD-10-CM | POA: Diagnosis not present

## 2022-11-22 DIAGNOSIS — Z8269 Family history of other diseases of the musculoskeletal system and connective tissue: Secondary | ICD-10-CM | POA: Insufficient documentation

## 2022-11-22 DIAGNOSIS — Z9889 Other specified postprocedural states: Secondary | ICD-10-CM | POA: Diagnosis not present

## 2022-11-22 DIAGNOSIS — Z9049 Acquired absence of other specified parts of digestive tract: Secondary | ICD-10-CM | POA: Diagnosis not present

## 2022-11-22 DIAGNOSIS — Z832 Family history of diseases of the blood and blood-forming organs and certain disorders involving the immune mechanism: Secondary | ICD-10-CM | POA: Insufficient documentation

## 2022-11-22 DIAGNOSIS — C189 Malignant neoplasm of colon, unspecified: Secondary | ICD-10-CM | POA: Diagnosis not present

## 2022-11-22 LAB — CBC WITH DIFFERENTIAL/PLATELET
Abs Immature Granulocytes: 0.01 10*3/uL (ref 0.00–0.07)
Basophils Absolute: 0 10*3/uL (ref 0.0–0.1)
Basophils Relative: 1 %
Eosinophils Absolute: 0.3 10*3/uL (ref 0.0–0.5)
Eosinophils Relative: 6 %
HCT: 48.1 % (ref 39.0–52.0)
Hemoglobin: 15.9 g/dL (ref 13.0–17.0)
Immature Granulocytes: 0 %
Lymphocytes Relative: 32 %
Lymphs Abs: 1.4 10*3/uL (ref 0.7–4.0)
MCH: 31.9 pg (ref 26.0–34.0)
MCHC: 33.1 g/dL (ref 30.0–36.0)
MCV: 96.6 fL (ref 80.0–100.0)
Monocytes Absolute: 0.4 10*3/uL (ref 0.1–1.0)
Monocytes Relative: 9 %
Neutro Abs: 2.3 10*3/uL (ref 1.7–7.7)
Neutrophils Relative %: 52 %
Platelets: 208 10*3/uL (ref 150–400)
RBC: 4.98 MIL/uL (ref 4.22–5.81)
RDW: 13 % (ref 11.5–15.5)
WBC: 4.4 10*3/uL (ref 4.0–10.5)
nRBC: 0 % (ref 0.0–0.2)

## 2022-11-22 LAB — COMPREHENSIVE METABOLIC PANEL
ALT: 30 U/L (ref 0–44)
AST: 25 U/L (ref 15–41)
Albumin: 4 g/dL (ref 3.5–5.0)
Alkaline Phosphatase: 114 U/L (ref 38–126)
Anion gap: 7 (ref 5–15)
BUN: 15 mg/dL (ref 8–23)
CO2: 26 mmol/L (ref 22–32)
Calcium: 8.8 mg/dL — ABNORMAL LOW (ref 8.9–10.3)
Chloride: 106 mmol/L (ref 98–111)
Creatinine, Ser: 1.01 mg/dL (ref 0.61–1.24)
GFR, Estimated: >60 mL/min (ref >60)
Glucose, Bld: 105 mg/dL — ABNORMAL HIGH (ref 70–99)
Potassium: 3.9 mmol/L (ref 3.5–5.1)
Sodium: 139 mmol/L (ref 135–145)
Total Bilirubin: 0.5 mg/dL (ref 0.3–1.2)
Total Protein: 7.2 g/dL (ref 6.5–8.1)

## 2022-11-22 MED ORDER — IOHEXOL 300 MG/ML  SOLN
100.0000 mL | Freq: Once | INTRAMUSCULAR | Status: AC | PRN
  Administered 2022-11-22: 100 mL via INTRAVENOUS

## 2022-11-22 MED ORDER — IOHEXOL 350 MG/ML SOLN: 100.0000 mL | Freq: Once | INTRAVENOUS | Status: DC | PRN

## 2022-11-22 NOTE — Progress Notes (Signed)
Received call report on abnormal CTAP from [facility].  Dr. Delton Coombes made aware.

## 2022-11-24 LAB — CEA: CEA: 2.4 ng/mL (ref 0.0–4.7)

## 2022-11-27 ENCOUNTER — Other Ambulatory Visit: Payer: Self-pay

## 2022-11-27 ENCOUNTER — Other Ambulatory Visit: Payer: Self-pay | Admitting: Neurology

## 2022-11-27 MED ORDER — XCOPRI 100 MG PO TABS: ORAL_TABLET | ORAL | 2 refills | Status: DC

## 2022-11-29 ENCOUNTER — Inpatient Hospital Stay (HOSPITAL_BASED_OUTPATIENT_CLINIC_OR_DEPARTMENT_OTHER): Payer: Medicaid Other | Admitting: Hematology

## 2022-11-29 ENCOUNTER — Other Ambulatory Visit: Payer: Self-pay | Admitting: Neurology

## 2022-11-29 ENCOUNTER — Encounter: Payer: Self-pay | Admitting: Hematology

## 2022-11-29 VITALS — BP 138/89 | HR 85 | Temp 97.7°F | Resp 18 | Wt 242.4 lb

## 2022-11-29 DIAGNOSIS — C182 Malignant neoplasm of ascending colon: Secondary | ICD-10-CM | POA: Diagnosis not present

## 2022-11-29 MED ORDER — XCOPRI 100 MG PO TABS: ORAL_TABLET | ORAL | 3 refills | Status: DC
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
## 2022-11-29 NOTE — Patient Instructions (Signed)
Carlos Miranda  Discharge Instructions  You were seen and examined today by Dr. Delton Coombes.  Dr. Delton Coombes discussed your most recent lab work and CT scan which revealed that you have some new lymph nodes in your abdomen. Your labs all look good.  Dr. Delton Coombes is going to get a PET scan to check on the lymph nodes and see if there is any cancer showing.  Follow-up as scheduled after PET scan.    Thank you for choosing [facility] to provide your oncology and hematology care.   To afford each patient quality time with our provider, please arrive at least 15 minutes before your scheduled appointment time. You may need to reschedule your appointment if you arrive late (10 or more minutes). Arriving late affects you and other patients whose appointments are after yours.  Also, if you miss three or more appointments without notifying the office, you may be dismissed from the clinic at the provider's discretion.    Again, thank you for choosing [facility].  Our hope is that these requests will decrease the amount of time that you wait before being seen by our physicians.   If you have a lab appointment with the [facility] please come in thru the Main Entrance and check in at the main information desk.           _____________________________________________________________  Should you have questions after your visit to [facility], please contact our office at [phone] and follow the prompts.  Our office hours are 8:00 a.m. to 4:30 p.m. Monday - Thursday and 8:00 a.m. to 2:30 p.m. Friday.  Please note that voicemails left after 4:00 p.m. may not be returned until the following business day.  We are closed weekends and all major holidays.  You do have access to a nurse 24-7, just call the main number to the clinic [phone] and do not press any options, hold on the line and a nurse will answer the phone.     For prescription refill requests, have your pharmacy contact our office and allow 72 hours.    Masks are optional in the cancer centers. If you would like for your care team to wear a mask while they are taking care of you, please let them know. You may have one support person who is at least 62 years old accompany you for your appointments.

## 2022-12-07 ENCOUNTER — Ambulatory Visit (HOSPITAL_COMMUNITY)
Admission: RE | Admit: 2022-12-07 | Discharge: 2022-12-07 | Disposition: A | Discharge status: Home / Self Care | Payer: Medicaid Other | Source: Ambulatory Visit | Attending: Hematology | Admitting: Hematology

## 2022-12-07 DIAGNOSIS — K5730 Diverticulosis of large intestine without perforation or abscess without bleeding: Secondary | ICD-10-CM | POA: Diagnosis not present

## 2022-12-07 DIAGNOSIS — C189 Malignant neoplasm of colon, unspecified: Secondary | ICD-10-CM | POA: Diagnosis not present

## 2022-12-07 DIAGNOSIS — C182 Malignant neoplasm of ascending colon: Secondary | ICD-10-CM | POA: Insufficient documentation

## 2022-12-07 DIAGNOSIS — R918 Other nonspecific abnormal finding of lung field: Secondary | ICD-10-CM | POA: Diagnosis not present

## 2022-12-07 DIAGNOSIS — K769 Liver disease, unspecified: Secondary | ICD-10-CM | POA: Diagnosis not present

## 2022-12-07 MED ORDER — FLUDEOXYGLUCOSE F - 18 (FDG) INJECTION
13.1400 | Freq: Once | INTRAVENOUS | Status: AC | PRN
  Administered 2022-12-07: 13.14 via INTRAVENOUS

## 2022-12-10 DIAGNOSIS — G4733 Obstructive sleep apnea (adult) (pediatric): Secondary | ICD-10-CM | POA: Diagnosis not present

## 2022-12-13 ENCOUNTER — Ambulatory Visit: Payer: Medicaid Other | Admitting: Hematology

## 2022-12-18 ENCOUNTER — Inpatient Hospital Stay: Payer: Medicaid Other | Attending: Hematology | Admitting: Hematology

## 2022-12-18 VITALS — BP 137/90 | HR 87 | Temp 97.8°F | Resp 18 | Ht 68.0 in | Wt 238.3 lb

## 2022-12-18 DIAGNOSIS — Z8269 Family history of other diseases of the musculoskeletal system and connective tissue: Secondary | ICD-10-CM | POA: Diagnosis not present

## 2022-12-18 DIAGNOSIS — Z9049 Acquired absence of other specified parts of digestive tract: Secondary | ICD-10-CM | POA: Diagnosis not present

## 2022-12-18 DIAGNOSIS — I10 Essential (primary) hypertension: Secondary | ICD-10-CM | POA: Diagnosis not present

## 2022-12-18 DIAGNOSIS — Z8261 Family history of arthritis: Secondary | ICD-10-CM | POA: Diagnosis not present

## 2022-12-18 DIAGNOSIS — C182 Malignant neoplasm of ascending colon: Secondary | ICD-10-CM | POA: Diagnosis not present

## 2022-12-18 DIAGNOSIS — Z880 Allergy status to penicillin: Secondary | ICD-10-CM | POA: Insufficient documentation

## 2022-12-18 DIAGNOSIS — Z8249 Family history of ischemic heart disease and other diseases of the circulatory system: Secondary | ICD-10-CM | POA: Diagnosis not present

## 2022-12-18 DIAGNOSIS — Z79899 Other long term (current) drug therapy: Secondary | ICD-10-CM | POA: Insufficient documentation

## 2022-12-18 DIAGNOSIS — G629 Polyneuropathy, unspecified: Secondary | ICD-10-CM | POA: Insufficient documentation

## 2022-12-18 DIAGNOSIS — E785 Hyperlipidemia, unspecified: Secondary | ICD-10-CM | POA: Diagnosis not present

## 2022-12-18 DIAGNOSIS — Z882 Allergy status to sulfonamides status: Secondary | ICD-10-CM | POA: Diagnosis not present

## 2022-12-18 DIAGNOSIS — Z888 Allergy status to other drugs, medicaments and biological substances status: Secondary | ICD-10-CM | POA: Insufficient documentation

## 2022-12-18 DIAGNOSIS — Z87891 Personal history of nicotine dependence: Secondary | ICD-10-CM | POA: Diagnosis not present

## 2022-12-18 DIAGNOSIS — Z832 Family history of diseases of the blood and blood-forming organs and certain disorders involving the immune mechanism: Secondary | ICD-10-CM | POA: Diagnosis not present
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
## 2022-12-18 NOTE — Patient Instructions (Signed)
[facility]  Discharge Instructions  You were seen and examined today by Dr. Delton Coombes.  Dr. Delton Coombes discussed your most recent lab work and CT scan which revealed several nodules.  Dr. Delton Coombes is ordering biopsy of one of the nodules by radiology. We will send the biopsy for NGS testing.  Follow-up as scheduled after biopsy.    Thank you for choosing [facility] to provide your oncology and hematology care.   To afford each patient quality time with our provider, please arrive at least 15 minutes before your scheduled appointment time. You may need to reschedule your appointment if you arrive late (10 or more minutes). Arriving late affects you and other patients whose appointments are after yours.  Also, if you miss three or more appointments without notifying the office, you may be dismissed from the clinic at the provider's discretion.    Again, thank you for choosing [facility].  Our hope is that these requests will decrease the amount of time that you wait before being seen by our physicians.   If you have a lab appointment with the [facility] please come in thru the Main Entrance and check in at the main information desk.           _____________________________________________________________  Should you have questions after your visit to [facility], please contact our office at [phone] and follow the prompts.  Our office hours are 8:00 a.m. to 4:30 p.m. Monday - Thursday and 8:00 a.m. to 2:30 p.m. Friday.  Please note that voicemails left after 4:00 p.m. may not be returned until the following business day.  We are closed weekends and all major holidays.  You do have access to a nurse 24-7, just call the main number to the clinic [phone] and do not press any options, hold on the line and a nurse will answer the phone.    For prescription refill requests, have your pharmacy  contact our office and allow 72 hours.    Masks are optional in the cancer centers. If you would like for your care team to wear a mask while they are taking care of you, please let them know. You may have one support person who is at least 62 years old accompany you for your appointments.

## 2022-12-22 ENCOUNTER — Encounter: Payer: Self-pay | Admitting: General Practice

## 2022-12-22 NOTE — Progress Notes (Signed)
Carlos Edouard, MD  Allen Kell, NT OK for peritoneal nodule biopsy under CT guidance. May not be window to midline peritoneal nodule, but could either create one w/ hydrodissection, or try to sample one of the other much smaller peritoneal nodules.  GY       Previous Messages    ----- Message ----- From: Allen Kell, NT Sent: 12/21/2022   2:39 PM EST To: Ir Procedure Requests Subject: FW: CT Biopsy                                  Sending Again ----- Message ----- From: Allen Kell, NT Sent: 12/18/2022   4:19 PM EST To: Ir Procedure Requests Subject: CT Biopsy                                      Procedure: CT Biopsy  Reason: IR biopsy of peritoneal nodule seen on PET scan from 12/07/22 Dx: Colon cancer, ascending  History: PET in chart  Provider: Derek Jack, MD  Contact: [phone]

## 2022-12-29 ENCOUNTER — Encounter: Payer: Self-pay | Admitting: Neurology

## 2022-12-29 ENCOUNTER — Ambulatory Visit (INDEPENDENT_AMBULATORY_CARE_PROVIDER_SITE_OTHER): Payer: Medicaid Other | Admitting: Neurology

## 2022-12-29 VITALS — BP 140/83 | HR 88 | Ht 68.0 in | Wt 243.7 lb

## 2022-12-29 DIAGNOSIS — G40219 Localization-related (focal) (partial) symptomatic epilepsy and epileptic syndromes with complex partial seizures, intractable, without status epilepticus: Secondary | ICD-10-CM
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
## 2023-01-03 ENCOUNTER — Other Ambulatory Visit: Payer: Self-pay | Admitting: Internal Medicine

## 2023-01-03 DIAGNOSIS — K669 Disorder of peritoneum, unspecified: Secondary | ICD-10-CM

## 2023-01-04 ENCOUNTER — Ambulatory Visit (HOSPITAL_COMMUNITY)
Admission: RE | Admit: 2023-01-04 | Discharge: 2023-01-04 | Disposition: A | Discharge status: Home / Self Care | Payer: Medicaid Other | Source: Ambulatory Visit | Attending: Hematology | Admitting: Hematology

## 2023-01-04 ENCOUNTER — Ambulatory Visit (HOSPITAL_COMMUNITY): Payer: Medicaid Other

## 2023-01-04 ENCOUNTER — Other Ambulatory Visit: Payer: Self-pay

## 2023-01-04 ENCOUNTER — Encounter (HOSPITAL_COMMUNITY): Payer: Self-pay

## 2023-01-04 DIAGNOSIS — R1909 Other intra-abdominal and pelvic swelling, mass and lump: Secondary | ICD-10-CM | POA: Diagnosis not present

## 2023-01-04 DIAGNOSIS — Z85038 Personal history of other malignant neoplasm of large intestine: Secondary | ICD-10-CM | POA: Diagnosis not present

## 2023-01-04 DIAGNOSIS — C786 Secondary malignant neoplasm of retroperitoneum and peritoneum: Secondary | ICD-10-CM | POA: Diagnosis not present

## 2023-01-04 DIAGNOSIS — I10 Essential (primary) hypertension: Secondary | ICD-10-CM | POA: Diagnosis not present

## 2023-01-04 DIAGNOSIS — R569 Unspecified convulsions: Secondary | ICD-10-CM | POA: Insufficient documentation

## 2023-01-04 DIAGNOSIS — E785 Hyperlipidemia, unspecified: Secondary | ICD-10-CM | POA: Insufficient documentation

## 2023-01-04 DIAGNOSIS — Z87891 Personal history of nicotine dependence: Secondary | ICD-10-CM | POA: Insufficient documentation

## 2023-01-04 DIAGNOSIS — K669 Disorder of peritoneum, unspecified: Secondary | ICD-10-CM | POA: Diagnosis not present

## 2023-01-04 DIAGNOSIS — C189 Malignant neoplasm of colon, unspecified: Secondary | ICD-10-CM | POA: Diagnosis not present

## 2023-01-04 DIAGNOSIS — C182 Malignant neoplasm of ascending colon: Secondary | ICD-10-CM | POA: Diagnosis not present

## 2023-01-04 DIAGNOSIS — K668 Other specified disorders of peritoneum: Secondary | ICD-10-CM | POA: Diagnosis not present

## 2023-01-04 LAB — BASIC METABOLIC PANEL
Anion gap: 8 (ref 5–15)
BUN: 13 mg/dL (ref 8–23)
CO2: 24 mmol/L (ref 22–32)
Calcium: 8.9 mg/dL (ref 8.9–10.3)
Chloride: 106 mmol/L (ref 98–111)
Creatinine, Ser: 0.89 mg/dL (ref 0.61–1.24)
GFR, Estimated: >60 mL/min (ref >60)
Glucose, Bld: 109 mg/dL — ABNORMAL HIGH (ref 70–99)
Potassium: 4.5 mmol/L (ref 3.5–5.1)
Sodium: 138 mmol/L (ref 135–145)

## 2023-01-04 LAB — CBC WITH DIFFERENTIAL/PLATELET
Abs Immature Granulocytes: 0.02 10*3/uL (ref 0.00–0.07)
Basophils Absolute: 0.1 10*3/uL (ref 0.0–0.1)
Basophils Relative: 1 %
Eosinophils Absolute: 0.5 10*3/uL (ref 0.0–0.5)
Eosinophils Relative: 11 %
HCT: 49.9 % (ref 39.0–52.0)
Hemoglobin: 16.7 g/dL (ref 13.0–17.0)
Immature Granulocytes: 0 %
Lymphocytes Relative: 33 %
Lymphs Abs: 1.6 10*3/uL (ref 0.7–4.0)
MCH: 32.4 pg (ref 26.0–34.0)
MCHC: 33.5 g/dL (ref 30.0–36.0)
MCV: 96.7 fL (ref 80.0–100.0)
Monocytes Absolute: 0.4 10*3/uL (ref 0.1–1.0)
Monocytes Relative: 8 %
Neutro Abs: 2.2 10*3/uL (ref 1.7–7.7)
Neutrophils Relative %: 47 %
Platelets: 193 10*3/uL (ref 150–400)
RBC: 5.16 MIL/uL (ref 4.22–5.81)
RDW: 13.2 % (ref 11.5–15.5)
WBC: 4.7 10*3/uL (ref 4.0–10.5)
nRBC: 0 % (ref 0.0–0.2)

## 2023-01-04 LAB — PROTIME-INR
INR: 1 (ref 0.8–1.2)
Prothrombin Time: 13.2 seconds (ref 11.4–15.2)

## 2023-01-04 MED ORDER — FENTANYL CITRATE (PF) 100 MCG/2ML IJ SOLN
INTRAMUSCULAR | Status: AC | PRN
  Administered 2023-01-04: 50 ug via INTRAVENOUS

## 2023-01-04 MED ORDER — SODIUM CHLORIDE 0.9 % IV SOLN: INTRAVENOUS | Status: DC

## 2023-01-04 MED ORDER — MIDAZOLAM HCL 2 MG/2ML IJ SOLN
INTRAMUSCULAR | Status: AC
  Filled 2023-01-04: qty 2

## 2023-01-04 MED ORDER — MIDAZOLAM HCL 2 MG/2ML IJ SOLN
INTRAMUSCULAR | Status: AC | PRN
  Administered 2023-01-04: 1 mg via INTRAVENOUS

## 2023-01-04 MED ORDER — LIDOCAINE HCL (PF) 1 % IJ SOLN
10.0000 mL | Freq: Once | INTRAMUSCULAR | Status: AC
  Administered 2023-01-04: 10 mL

## 2023-01-04 MED ORDER — FENTANYL CITRATE (PF) 100 MCG/2ML IJ SOLN
INTRAMUSCULAR | Status: AC
  Filled 2023-01-04: qty 2
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
## 2023-01-04 NOTE — Procedures (Signed)
Interventional Radiology Procedure Note  Procedure: CT guided peritoneal mass biopsy  Findings: Please refer to procedural dictation for full description. RLQ peritoneal mass 18 ga core x3.    Complications: None immediate  Estimated Blood Loss: < 5 mL  Recommendations: 2 hour bedrest Follow up Pathology results   Ruthann Cancer, MD

## 2023-01-06 DIAGNOSIS — G4733 Obstructive sleep apnea (adult) (pediatric): Secondary | ICD-10-CM | POA: Diagnosis not present

## 2023-01-08 DIAGNOSIS — G4733 Obstructive sleep apnea (adult) (pediatric): Secondary | ICD-10-CM | POA: Diagnosis not present

## 2023-01-08 LAB — SURGICAL PATHOLOGY

## 2023-01-15 ENCOUNTER — Inpatient Hospital Stay: Payer: Medicaid Other | Admitting: Hematology

## 2023-01-22 ENCOUNTER — Ambulatory Visit: Payer: Medicaid Other | Admitting: Hematology

## 2023-01-23 ENCOUNTER — Inpatient Hospital Stay: Payer: Medicaid Other | Admitting: Hematology

## 2023-01-24 ENCOUNTER — Encounter (HOSPITAL_COMMUNITY): Payer: Self-pay

## 2023-01-24 DIAGNOSIS — U071 COVID-19: Secondary | ICD-10-CM | POA: Diagnosis not present
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
## 2023-01-30 ENCOUNTER — Telehealth: Payer: Self-pay

## 2023-01-30 ENCOUNTER — Telehealth (HOSPITAL_COMMUNITY): Payer: Self-pay | Admitting: Pharmacist

## 2023-01-30 ENCOUNTER — Other Ambulatory Visit (HOSPITAL_COMMUNITY): Payer: Self-pay

## 2023-01-30 ENCOUNTER — Encounter: Payer: Self-pay | Admitting: Hematology

## 2023-01-30 ENCOUNTER — Inpatient Hospital Stay: Payer: Medicare Other | Attending: Hematology | Admitting: Hematology

## 2023-01-30 ENCOUNTER — Other Ambulatory Visit: Payer: Self-pay

## 2023-01-30 VITALS — BP 145/89 | HR 92 | Temp 99.0°F | Resp 18 | Ht 68.0 in | Wt 240.0 lb

## 2023-01-30 DIAGNOSIS — E785 Hyperlipidemia, unspecified: Secondary | ICD-10-CM | POA: Diagnosis not present

## 2023-01-30 DIAGNOSIS — C786 Secondary malignant neoplasm of retroperitoneum and peritoneum: Secondary | ICD-10-CM | POA: Diagnosis not present

## 2023-01-30 DIAGNOSIS — R1900 Intra-abdominal and pelvic swelling, mass and lump, unspecified site: Secondary | ICD-10-CM | POA: Insufficient documentation

## 2023-01-30 DIAGNOSIS — Z5112 Encounter for antineoplastic immunotherapy: Secondary | ICD-10-CM | POA: Insufficient documentation

## 2023-01-30 DIAGNOSIS — Z8249 Family history of ischemic heart disease and other diseases of the circulatory system: Secondary | ICD-10-CM | POA: Diagnosis not present

## 2023-01-30 DIAGNOSIS — G629 Polyneuropathy, unspecified: Secondary | ICD-10-CM | POA: Insufficient documentation

## 2023-01-30 DIAGNOSIS — Z8269 Family history of other diseases of the musculoskeletal system and connective tissue: Secondary | ICD-10-CM | POA: Insufficient documentation

## 2023-01-30 DIAGNOSIS — Z832 Family history of diseases of the blood and blood-forming organs and certain disorders involving the immune mechanism: Secondary | ICD-10-CM | POA: Diagnosis not present

## 2023-01-30 DIAGNOSIS — Z880 Allergy status to penicillin: Secondary | ICD-10-CM | POA: Diagnosis not present

## 2023-01-30 DIAGNOSIS — C182 Malignant neoplasm of ascending colon: Secondary | ICD-10-CM | POA: Diagnosis not present

## 2023-01-30 DIAGNOSIS — Z1501 Genetic susceptibility to malignant neoplasm of breast: Secondary | ICD-10-CM | POA: Diagnosis not present

## 2023-01-30 DIAGNOSIS — Z9049 Acquired absence of other specified parts of digestive tract: Secondary | ICD-10-CM | POA: Insufficient documentation

## 2023-01-30 DIAGNOSIS — Z87891 Personal history of nicotine dependence: Secondary | ICD-10-CM | POA: Insufficient documentation

## 2023-01-30 DIAGNOSIS — Z882 Allergy status to sulfonamides status: Secondary | ICD-10-CM | POA: Diagnosis not present

## 2023-01-30 DIAGNOSIS — Z8261 Family history of arthritis: Secondary | ICD-10-CM | POA: Insufficient documentation

## 2023-01-30 DIAGNOSIS — R918 Other nonspecific abnormal finding of lung field: Secondary | ICD-10-CM | POA: Diagnosis not present

## 2023-01-30 DIAGNOSIS — I10 Essential (primary) hypertension: Secondary | ICD-10-CM | POA: Diagnosis not present

## 2023-01-30 DIAGNOSIS — Z803 Family history of malignant neoplasm of breast: Secondary | ICD-10-CM | POA: Diagnosis not present

## 2023-01-30 DIAGNOSIS — Z79899 Other long term (current) drug therapy: Secondary | ICD-10-CM | POA: Insufficient documentation

## 2023-01-30 DIAGNOSIS — Z888 Allergy status to other drugs, medicaments and biological substances status: Secondary | ICD-10-CM | POA: Diagnosis not present

## 2023-01-30 MED ORDER — CLINDAMYCIN PHOSPHATE 1 % EX GEL: Freq: Two times a day (BID) | CUTANEOUS | 4 refills | Status: DC

## 2023-01-30 MED ORDER — PROCHLORPERAZINE MALEATE 10 MG PO TABS: 10.0000 mg | ORAL_TABLET | Freq: Four times a day (QID) | ORAL | 6 refills | Status: AC | PRN

## 2023-01-30 MED ORDER — DOXYCYCLINE HYCLATE 100 MG PO TABS: 100.0000 mg | ORAL_TABLET | Freq: Two times a day (BID) | ORAL | 3 refills | Status: DC

## 2023-01-30 MED ORDER — BRAFTOVI 75 MG PO CAPS
225.0000 mg | ORAL_CAPSULE | Freq: Every day | ORAL | 0 refills | Status: DC
Start: 2023-01-30 — End: 2023-03-21
  Filled 2023-01-30 – 2023-02-08 (×2): qty 90, 30d supply, fill #0

## 2023-01-30 NOTE — Telephone Encounter (Signed)
Oral Oncology Pharmacist Encounter  Received new prescription for Braftovi (encorafenib) for the treatment of metastatic colon cancer, BRAF v600E positive in conjunction with panitumumab, planned duration until disease progression or unacceptable drug toxicity. Encorafenib planned start on 02/21/23, 2 weeks after panitumumab per MD note  CMP from 11/22/22 assessed, no relevant lab abnormalities. Prescription dose and frequency assessed. MD starting patient on a reduced dose and plans to increase if tolerated.   ECHO is recommenced before treatment, one month after initiating treatment and every 2 to 3 months during treatment for EF monitoring   Current medication list in Epic reviewed, several DDIs with encorafenib identified: Oxcarbazepine: encorafenib may decrease the serum concentration of oxcarbazepine. Dose adjustment of oxcarbazepine may be required after initiation. (Category C: Monitor Therapy) Cenobamate: cenobamate may decrease the serum concentration of Encorafenib. Monitor for reduced encorafenib efficacy Tadalafil: encorafenib may decrease the serum concentration of tadalafil. Monitor for reduce clinical effectiveness of tadalafil when used for the treatment of erectile dysfunction. Pravastatin: Encorafenib may increase the serum concentration of pravastatin. Monitor for increased concentrations and toxicities of pravastatin.  Evaluated chart and no patient barriers to medication adherence identified.   Prescription has been e-scribed to the [facility] for benefits analysis and approval.  Oral Oncology Clinic will continue to follow for insurance authorization, copayment issues, initial counseling and start date.   Darl Pikes, PharmD, BCPS, BCOP, CPP Hematology/Oncology Clinical Pharmacist Practitioner [place] [phone]  01/30/2023 12:40 PM

## 2023-01-30 NOTE — Patient Instructions (Addendum)
[place] at [facility] Discharge Instructions   You were seen and examined today by Dr. Delton Coombes.  He reviewed the results of your biopsy which shows this is the colon cancer has come back. He also discussed with you the results of the special NGS testing we did on this biopsy which shows you have a mutation that can be targeted for treatment with a monoclonal antibody called Vectibix, and a pill called Braftovi. The Vectibix you will come to the clinic to receive once every 2 weeks. The pills you will take daily as prescribed at home. Even if you get the pills, DO NOT start taking the pills until Dr. Raliegh Ip tells you to start.   These medications may cause you to develop a skin rash. We sent antibiotics to your pharmacy to take - one pill twice a day IF you develop a rash. We will also send a lotion for you to apply  Return as scheduled.   Thank you for choosing [place] at [facility] to provide your oncology and hematology care.  To afford each patient quality time with our provider, please arrive at least 15 minutes before your scheduled appointment time.   If you have a lab appointment with the [facility] please come in thru the Main Entrance and check in at the main information desk.  You need to re-schedule your appointment should you arrive 10 or more minutes late.  We strive to give you quality time with our providers, and arriving late affects you and other patients whose appointments are after yours.  Also, if you no show three or more times for appointments you may be dismissed from the clinic at the providers discretion.     Again, thank you for choosing [facility].  Our hope is that these requests will decrease the amount of time that you wait before being seen by our physicians.       _____________________________________________________________  Should you have questions after your visit to [facility],  please contact our office at [phone] and follow the prompts.  Our office hours are 8:00 a.m. and 4:30 p.m. Monday - Friday.  Please note that voicemails left after 4:00 p.m. may not be returned until the following business day.  We are closed weekends and major holidays.  You do have access to a nurse 24-7, just call the main number to the clinic [phone] and do not press any options, hold on the line and a nurse will answer the phone.    For prescription refill requests, have your pharmacy contact our office and allow 72 hours.    Due to Covid, you will need to wear a mask upon entering the hospital. If you do not have a mask, a mask will be given to you at the Main Entrance upon arrival. For doctor visits, patients may have 1 support person age 50 or older with them. For treatment visits, patients can not have anyone with them due to social distancing guidelines and our immunocompromised population.

## 2023-01-30 NOTE — Telephone Encounter (Signed)
Oral Oncology Patient Advocate Encounter  After completing a benefits investigation, prior authorization for Braftovi is not required at this time through [facility] ([facility]).  Patient's copay is $4.00.     Berdine Addison, [facility] [place]  [phone] (phone) [phone] (fax) 01/30/2023 12:43 PM

## 2023-01-30 NOTE — Progress Notes (Signed)
DISCONTINUE ON PATHWAY REGIMEN - Colorectal     A cycle is every 14 days:     Oxaliplatin      Leucovorin      Fluorouracil      Fluorouracil   **Always confirm dose/schedule in your pharmacy ordering system**  REASON: Other Reason PRIOR TREATMENT: COS67: mFOLFOX6 q14 Days x 6 Months TREATMENT RESPONSE: N/A - Adjuvant Therapy  START ON PATHWAY REGIMEN - Colorectal     Cycle 1: A cycle is 28 days:     Encorafenib      Cetuximab      Cetuximab    Cycles 2 and beyond: A cycle is every 28 days:     Encorafenib      Cetuximab   **Always confirm dose/schedule in your pharmacy ordering system**  Patient Characteristics: Distant Metastases, Nonsurgical Candidate, BRAF V600 Mutation Positive (KRAS/NRAS Wild-Type), Standard Cytotoxic/Targeted Therapy, Second Line Standard Cytotoxic/Targeted Therapy Tumor Location: Colon Therapeutic Status: Distant Metastases Microsatellite/Mismatch Repair Status: MSS/pMMR BRAF Mutation Status: Mutation Positive KRAS/NRAS Mutation Status: Wild-Type (no mutation) Standard Cytotoxic/Targeted Line of Therapy: Second Line Standard Cytotoxic/Targeted Therapy Intent of Therapy: Non-Curative / Palliative Intent, Discussed with Patient

## 2023-01-31 ENCOUNTER — Encounter: Payer: Self-pay | Admitting: *Deleted

## 2023-02-01 ENCOUNTER — Other Ambulatory Visit: Payer: Self-pay

## 2023-02-02 ENCOUNTER — Other Ambulatory Visit: Payer: Self-pay | Admitting: *Deleted

## 2023-02-02 DIAGNOSIS — R21 Rash and other nonspecific skin eruption: Secondary | ICD-10-CM

## 2023-02-02 IMAGING — CT CT CHEST-ABD-PELV W/ CM
2 of 5 series · 11 of 46 positions shown, 12 images · IV contrast (iopamidol)
Comparison: CT chest abdomen pelvis August 10, 2020

CLINICAL DATA: Colon cancer restaging, history of partial colectomy
on 11/10/2020

EXAM:
CT CHEST, ABDOMEN, AND PELVIS WITH CONTRAST
TECHNIQUE: Multidetector CT imaging of the chest, abdomen and pelvis was
performed following the standard protocol during bolus
administration of intravenous contrast.
CONTRAST:  100mL 9SUO4V-XYY IOPAMIDOL (9SUO4V-XYY) INJECTION 61%

[Series 2: cap with 5.00 br40 s3 axial · axial · 0.68mm/px · z∈[+1215,+1750]mm · 8 of 129 slices shown, 9 images]
[im 11/129  soft-tissue]
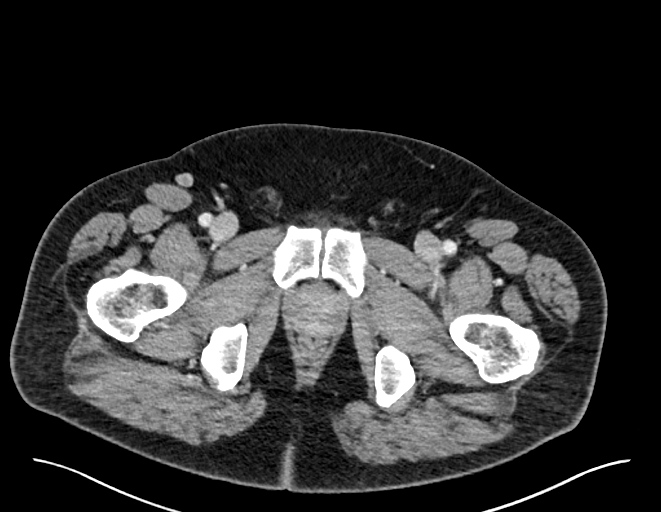
[im 11/129  bone]
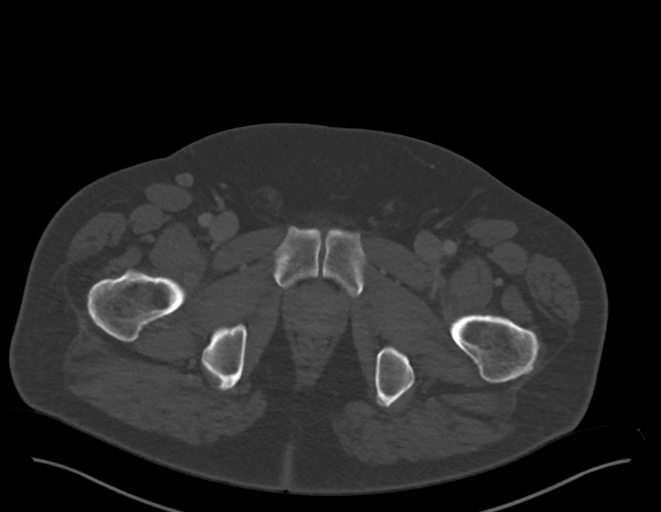
[im 33/129  soft-tissue]
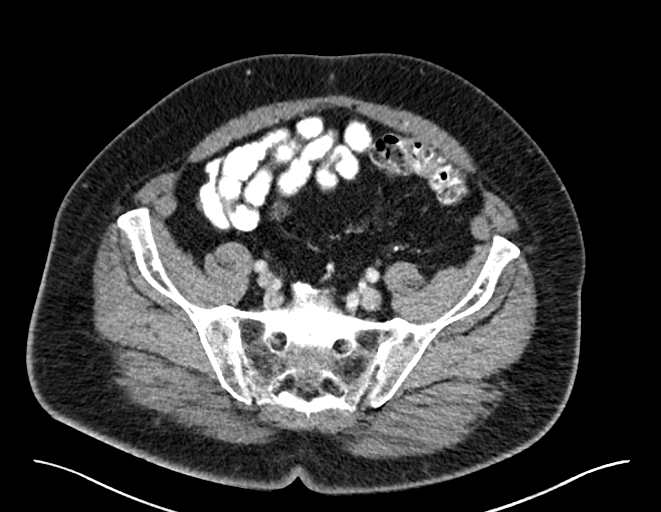
[im 43/129  soft-tissue]
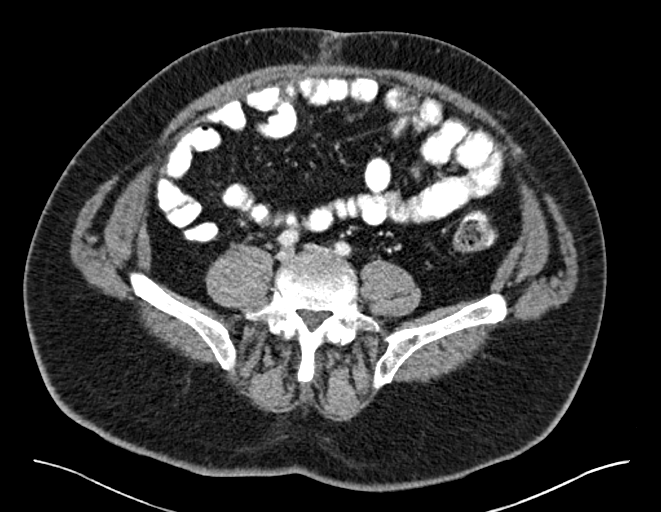
[im 54/129  soft-tissue]
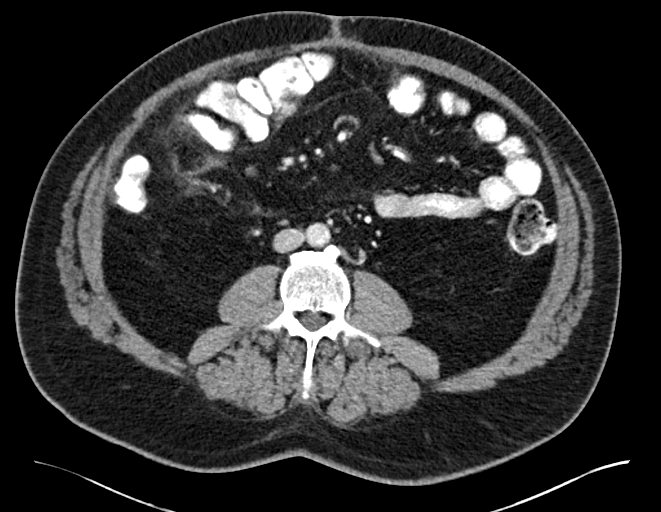
[im 75/129  soft-tissue]
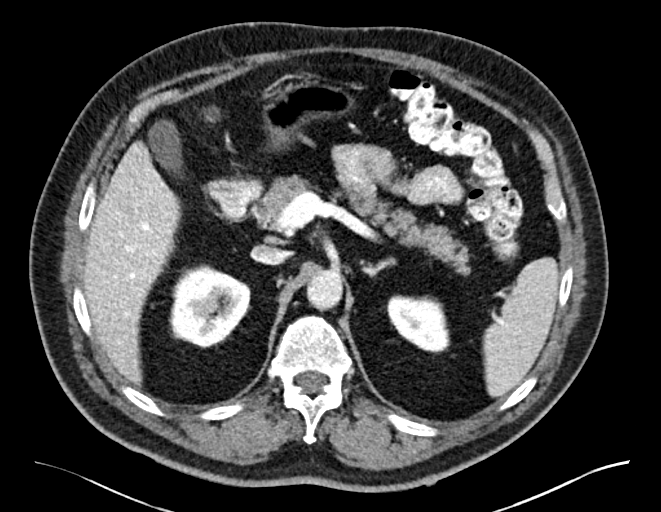
[im 86/129  soft-tissue]
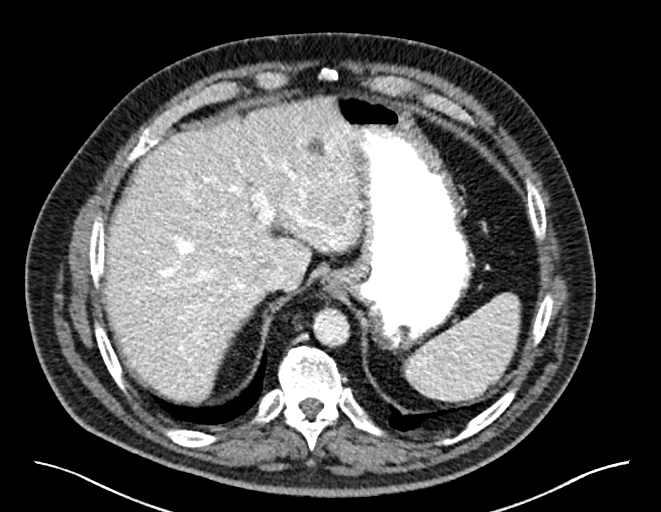
[im 97/129  soft-tissue]
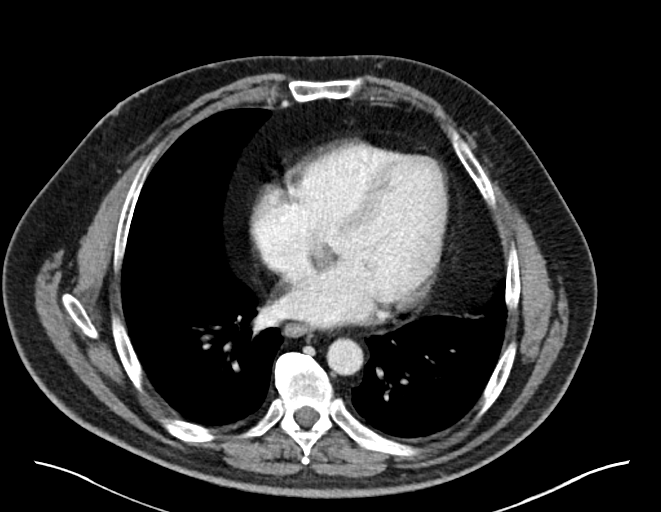
[im 118/129  soft-tissue]
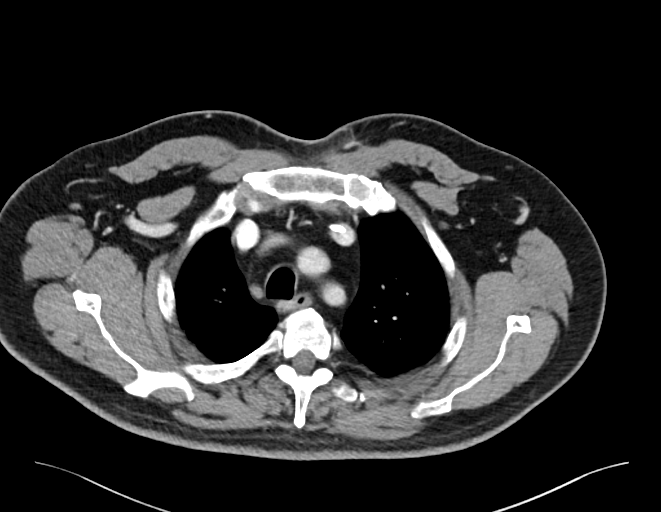

[Series 6: cap with 2.00 br40 s3 cor · coronal · 0.87mm/px · 3 of 180 slices shown]
[im 60/180  soft-tissue]
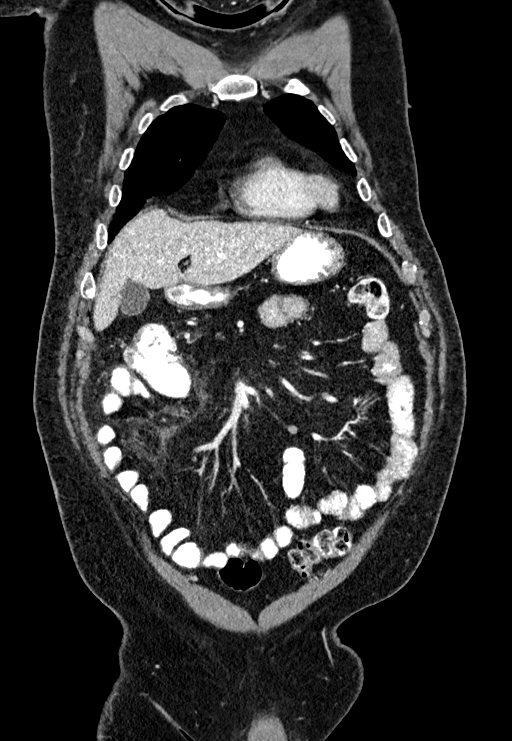
[im 80/180  soft-tissue]
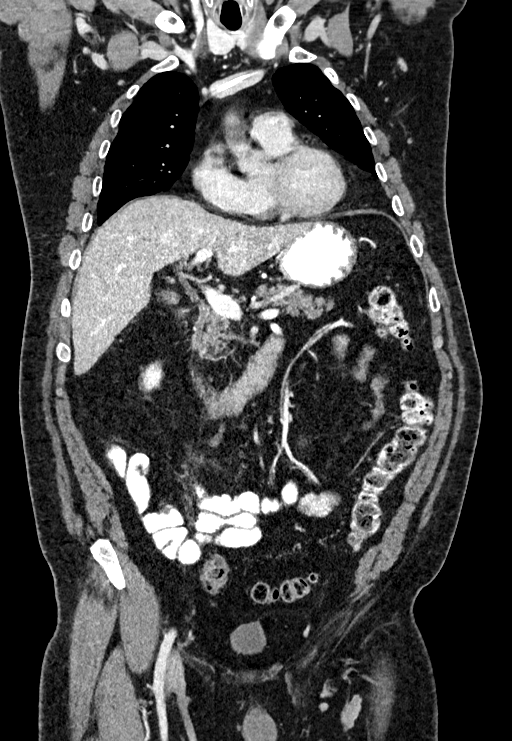
[im 100/180  soft-tissue]
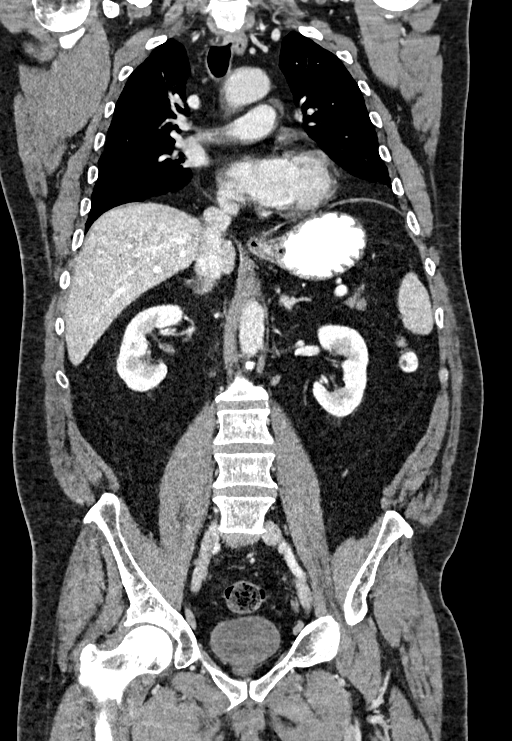

[11 of 46 positions shown; findings below may reference images not displayed]

FINDINGS: CT CHEST FINDINGS

Cardiovascular: Left approach central venous catheter with tip in
the superior vena cava. No thoracic aortic aneurysm. No central
pulmonary embolus. Normal size heart. No pericardial effusion.

Mediastinum/Nodes: 1.3 cm hypodense right thyroid nodule. Not
clinically significant; no follow-up imaging recommended (ref: [HOSPITAL]. [DATE]): 143-50).No pathologically enlarged
mediastinal, axillary lymph nodes. Trachea and esophagus are
unremarkable.

Lungs/Pleura: Again seen are scattered pulmonary nodules. Reference
nodules are as follows:

-unchanged size of the right upper lobe pulmonary nodule measuring 8
x 5 mm on image 56/4.

-unchanged size of the left lower lobe pulmonary nodule measuring 6
mm on image 87/4.

-unchanged size of the subpleural left lower lobe pulmonary nodule
measuring 5 mm on image 83/4.

No new or enlarging suspicious pulmonary nodules or masses. No focal
consolidation. No pleural effusion. No pneumothorax.

Musculoskeletal: No chest wall mass or suspicious bone lesions
identified. Thoracic diffuse idiopathic skeletal hyperostosis.

CT ABDOMEN PELVIS FINDINGS

Hepatobiliary: Unchanged size of the 1.1 cm low-density lesion in
the left lobe of the liver likely representing hepatic cysts. No
solid enhancing hepatic lesions. Gallbladder is unremarkable. No
biliary ductal dilation.

Pancreas: Unremarkable

Spleen: Unremarkable

Adrenals/Urinary Tract: Adrenal glands are unremarkable. Kidneys are
normal, without renal calculi, focal lesion, or hydronephrosis.
Bladder is unremarkable.

Stomach/Bowel: Surgical changes of right hemicolectomy with mild
inflammatory stranding in the colectomy bed with prominent adjacent
lymph nodes measuring up to 6 mm for instance on image 75/2 and
76/2. No evidence of obstruction or extraluminal contrast material.

Enteric contrast is visualized to the level of descending colon.
Stomach is grossly unremarkable. Normal positioning of the
duodenum/ligament of [place]. No suspicious small bowel wall
thickening or dilation. Scattered colonic diverticulosis without
findings of acute diverticulitis.

Vascular/Lymphatic: Aortic atherosclerosis. No pathologically
enlarged abdominal or pelvic lymph nodes.

Reproductive: Prostate is unremarkable.

Other: Misty appearance of the mesentery with prominent mesenteric
lymph nodes measuring up to 4 mm for instance on image 64/2.
Surgical changes in the anterior abdominal wall. No abdominopelvic
ascites.

Musculoskeletal: Multilevel degenerative change of the spine. No
acute osseous abnormality. No aggressive lytic or blastic lesion of
bone.
IMPRESSION: 1. Surgical changes of right hemicolectomy with mild inflammatory
stranding in the colectomy bed and prominent adjacent lymph nodes
measuring up to 6 mm. Findings are nonspecific and may represent
postoperative changes, however, close attention on follow-up is
recommended.
2. Misty appearance of the mesentery with prominent mesenteric lymph
nodes measuring up to 4 mm, which is nonspecific but unchanged from
prior. Recommend attention on follow-up imaging.
3. Stable appearance of multiple pulmonary nodules. No new or
enlarging suspicious pulmonary nodules or masses.
4. Aortic atherosclerosis.

Aortic Atherosclerosis (IHRGI-EFR.R).

## 2023-02-02 MED ORDER — TRIAMCINOLONE ACETONIDE 0.025 % EX CREA
TOPICAL_CREAM | Freq: Two times a day (BID) | CUTANEOUS | 1 refills | Status: AC
Start: 2023-02-02 — End: ?

## 2023-02-05 ENCOUNTER — Telehealth: Payer: Self-pay

## 2023-02-05 ENCOUNTER — Other Ambulatory Visit: Payer: Self-pay

## 2023-02-05 DIAGNOSIS — R569 Unspecified convulsions: Secondary | ICD-10-CM

## 2023-02-05 DIAGNOSIS — Z79899 Other long term (current) drug therapy: Secondary | ICD-10-CM

## 2023-02-05 NOTE — Telephone Encounter (Signed)
-----   Message from Van Clines, MD sent at 02/05/2023  3:51 PM EDT ----- Regarding: oxcarbazepine level Can you pls order an oxcarbazepine level on him, ideally to be done towards end of day before he takes evening dose. Thanks

## 2023-02-05 NOTE — Telephone Encounter (Signed)
Thanks for the update. We can check baseline oxcarbazepine levels and monitor. Dr. Ellin Saba, pls let me know if you feel his cenobamate is affecting encorafenib, otherwise we will stay on same dose.  Thanks!

## 2023-02-05 NOTE — Telephone Encounter (Signed)
Pt called no answer left a voice mail per DPR Dr Karel Jarvis would like to check an oxcarbazepine level on him, ideally to be done towards end of day before he takes evening dose.

## 2023-02-06 ENCOUNTER — Other Ambulatory Visit (INDEPENDENT_AMBULATORY_CARE_PROVIDER_SITE_OTHER): Payer: Medicaid Other

## 2023-02-06 ENCOUNTER — Telehealth: Payer: Self-pay | Admitting: Neurology

## 2023-02-06 ENCOUNTER — Other Ambulatory Visit: Payer: Self-pay

## 2023-02-06 DIAGNOSIS — R569 Unspecified convulsions: Secondary | ICD-10-CM

## 2023-02-06 DIAGNOSIS — Z79899 Other long term (current) drug therapy: Secondary | ICD-10-CM | POA: Diagnosis not present

## 2023-02-06 NOTE — Telephone Encounter (Signed)
Pt called to say he's had Covid within the last 2 weeks and still has sx's. Pt wants to know if this will affect result levels. If no answer to please leave a message on answering machine.

## 2023-02-06 NOTE — Telephone Encounter (Signed)
Ok to do oxcarbazepine level, thanks

## 2023-02-06 NOTE — Telephone Encounter (Signed)
Notified patient.

## 2023-02-06 NOTE — Telephone Encounter (Signed)
Patient states more information to give [place], asked for a call back.

## 2023-02-06 NOTE — Telephone Encounter (Signed)
Pt called back in stating he got Heather's voicemail, but didn't quite understand what Dr. Karel Jarvis was wanting him to do.

## 2023-02-07 ENCOUNTER — Inpatient Hospital Stay: Payer: Medicare Other

## 2023-02-07 VITALS — BP 142/89 | HR 88 | Temp 97.8°F | Resp 18

## 2023-02-07 VITALS — BP 131/80 | HR 96 | Temp 98.3°F | Resp 20 | Wt 244.6 lb

## 2023-02-07 DIAGNOSIS — Z5112 Encounter for antineoplastic immunotherapy: Secondary | ICD-10-CM | POA: Diagnosis not present

## 2023-02-07 DIAGNOSIS — C182 Malignant neoplasm of ascending colon: Secondary | ICD-10-CM

## 2023-02-07 LAB — COMPREHENSIVE METABOLIC PANEL
ALT: 19 U/L (ref 0–44)
AST: 18 U/L (ref 15–41)
Albumin: 3.7 g/dL (ref 3.5–5.0)
Alkaline Phosphatase: 109 U/L (ref 38–126)
Anion gap: 7 (ref 5–15)
BUN: 12 mg/dL (ref 8–23)
CO2: 26 mmol/L (ref 22–32)
Calcium: 8.5 mg/dL — ABNORMAL LOW (ref 8.9–10.3)
Chloride: 104 mmol/L (ref 98–111)
Creatinine, Ser: 0.95 mg/dL (ref 0.61–1.24)
GFR, Estimated: >60 mL/min (ref >60)
Glucose, Bld: 118 mg/dL — ABNORMAL HIGH (ref 70–99)
Potassium: 3.7 mmol/L (ref 3.5–5.1)
Sodium: 137 mmol/L (ref 135–145)
Total Bilirubin: 0.7 mg/dL (ref 0.3–1.2)
Total Protein: 7.1 g/dL (ref 6.5–8.1)

## 2023-02-07 LAB — CBC WITH DIFFERENTIAL/PLATELET
Abs Immature Granulocytes: 0.02 10*3/uL (ref 0.00–0.07)
Basophils Absolute: 0 10*3/uL (ref 0.0–0.1)
Basophils Relative: 1 %
Eosinophils Absolute: 0.4 10*3/uL (ref 0.0–0.5)
Eosinophils Relative: 9 %
HCT: 47.4 % (ref 39.0–52.0)
Hemoglobin: 15.9 g/dL (ref 13.0–17.0)
Immature Granulocytes: 0 %
Lymphocytes Relative: 27 %
Lymphs Abs: 1.2 10*3/uL (ref 0.7–4.0)
MCH: 32.2 pg (ref 26.0–34.0)
MCHC: 33.5 g/dL (ref 30.0–36.0)
MCV: 96 fL (ref 80.0–100.0)
Monocytes Absolute: 0.3 10*3/uL (ref 0.1–1.0)
Monocytes Relative: 8 %
Neutro Abs: 2.5 10*3/uL (ref 1.7–7.7)
Neutrophils Relative %: 55 %
Platelets: 172 10*3/uL (ref 150–400)
RBC: 4.94 MIL/uL (ref 4.22–5.81)
RDW: 13 % (ref 11.5–15.5)
WBC: 4.5 10*3/uL (ref 4.0–10.5)
nRBC: 0 % (ref 0.0–0.2)

## 2023-02-07 LAB — MAGNESIUM: Magnesium: 2 mg/dL (ref 1.7–2.4)

## 2023-02-07 MED ORDER — SODIUM CHLORIDE 0.9 % IV SOLN: Freq: Once | INTRAVENOUS | Status: AC

## 2023-02-07 MED ORDER — SODIUM CHLORIDE 0.9% FLUSH
10.0000 mL | INTRAVENOUS | Status: DC | PRN
  Administered 2023-02-07: 10 mL

## 2023-02-07 MED ORDER — HEPARIN SOD (PORK) LOCK FLUSH 100 UNIT/ML IV SOLN
500.0000 [IU] | Freq: Once | INTRAVENOUS | Status: AC | PRN
  Administered 2023-02-07: 500 [IU]

## 2023-02-07 MED ORDER — SODIUM CHLORIDE 0.9 % IV SOLN
6.0000 mg/kg | Freq: Once | INTRAVENOUS | Status: AC
  Administered 2023-02-07: 700 mg via INTRAVENOUS
  Filled 2023-02-07: qty 20

## 2023-02-07 MED ORDER — SODIUM CHLORIDE 0.9% FLUSH
10.0000 mL | Freq: Once | INTRAVENOUS | Status: AC
  Administered 2023-02-07: 10 mL

## 2023-02-07 NOTE — Progress Notes (Signed)
Pharmacist Chemotherapy Monitoring - Initial Assessment    Anticipated start date: 02/07/23   The following has been reviewed per standard work regarding the patient's treatment regimen: The patient's diagnosis, treatment plan and drug doses, and organ/hematologic function Lab orders and baseline tests specific to treatment regimen  The treatment plan start date, drug sequencing, and pre-medications Prior authorization status  Patient's documented medication list, including drug-drug interaction screen and prescriptions for anti-emetics and supportive care specific to the treatment regimen The drug concentrations, fluid compatibility, administration routes, and timing of the medications to be used The patient's access for treatment and lifetime cumulative dose history, if applicable  The patient's medication allergies and previous infusion related reactions, if applicable   Changes made to treatment plan:  N/A  Follow up needed:  N/A   Stephens Shire, [facility], 02/07/2023  11:10 AM

## 2023-02-07 NOTE — Progress Notes (Signed)
Patient presents today for chemotherapy infusion.  Patient is in satisfactory condition with no new complaints voiced.  Vital signs are stable.  Labs reviewed and all labs are within treatment parameters. We will proceed with treatment per MD orders.   Patient tolerated treatment well with no complaints voiced.  Patient left ambulatory in stable condition.  Vital signs stable at discharge.  Follow up as scheduled.    

## 2023-02-07 NOTE — Patient Instructions (Signed)
[facility]  Discharge Instructions: Thank you for choosing [facility] to provide your oncology and hematology care.  If you have a lab appointment with the Cancer Center - please note that after April 8th, 2024, all labs will be drawn in the cancer center.  You do not have to check in or register with the main entrance as you have in the past but will complete your check-in in the cancer center.  Wear comfortable clothing and clothing appropriate for easy access to any Portacath or PICC line.   We strive to give you quality time with your provider. You may need to reschedule your appointment if you arrive late (15 or more minutes).  Arriving late affects you and other patients whose appointments are after yours.  Also, if you miss three or more appointments without notifying the office, you may be dismissed from the clinic at the provider's discretion.      For prescription refill requests, have your pharmacy contact our office and allow 72 hours for refills to be completed.    Today you received the following chemotherapy and/or immunotherapy agents Vectibix.  Panitumumab Injection What is this medication? PANITUMUMAB (pan i TOOM ue mab) treats colorectal cancer. It works by blocking a protein that causes cancer cells to grow and multiply. This helps to slow or stop the spread of cancer cells. It is a monoclonal antibody. This medicine may be used for other purposes; ask your health care provider or pharmacist if you have questions. COMMON BRAND NAME(S): Vectibix What should I tell my care team before I take this medication? They need to know if you have any of these conditions: Eye disease Low levels of magnesium in the blood Lung disease An unusual or allergic reaction to panitumumab, other medications, foods, dyes, or preservatives Pregnant or trying to get pregnant Breast-feeding How should I use this medication? This medication is injected into a  vein. It is given by your care team in a hospital or clinic setting. Talk to your care team about the use of this medication in children. Special care may be needed. Overdosage: If you think you have taken too much of this medicine contact a poison control center or emergency room at once. NOTE: This medicine is only for you. Do not share this medicine with others. What if I miss a dose? Keep appointments for follow-up doses. It is important not to miss your dose. Call your care team if you are unable to keep an appointment. What may interact with this medication? Bevacizumab This list may not describe all possible interactions. Give your health care provider a list of all the medicines, herbs, non-prescription drugs, or dietary supplements you use. Also tell them if you smoke, drink alcohol, or use illegal drugs. Some items may interact with your medicine. What should I watch for while using this medication? Your condition will be monitored carefully while you are receiving this medication. This medication may make you feel generally unwell. This is not uncommon as chemotherapy can affect healthy cells as well as cancer cells. Report any side effects. Continue your course of treatment even though you feel ill unless your care team tells you to stop. This medication can make you more sensitive to the sun. Keep out of the sun while receiving this medication and for 2 months after stopping therapy. If you cannot avoid being in the sun, wear protective clothing and sunscreen. Do not use sun lamps, tanning beds, or tanning booths.  Check with your care team if you have severe diarrhea, nausea, and vomiting or if you sweat a lot. The loss of too much body fluid may make it dangerous for you to take this medication. This medication may cause serious skin reactions. They can happen weeks to months after starting the medication. Contact your care team right away if you notice fevers or flu-like symptoms with a  rash. The rash may be red or purple and then turn into blisters or peeling of the skin. You may also notice a red rash with swelling of the face, lips, or lymph nodes in your neck or under your arms. Talk to your care team if you may be pregnant. Serious birth defects can occur if you take this medication during pregnancy and for 2 months after the last dose. Contraception is recommended while taking this medication and for 2 months after the last dose. Your care team can help you find the option that works for you. Do not breastfeed while taking this medication and for 2 months after the last dose. This medication may cause infertility. Talk to your care team if you are concerned about your fertility. What side effects may I notice from receiving this medication? Side effects that you should report to your care team as soon as possible: Allergic reactions--skin rash, itching, hives, swelling of the face, lips, tongue, or throat Dry cough, shortness of breath or trouble breathing Eye pain, redness, irritation, or discharge with blurry or decreased vision Infusion reactions--chest pain, shortness of breath or trouble breathing, feeling faint or lightheaded Low magnesium level--muscle pain or cramps, unusual weakness or fatigue, fast or irregular heartbeat, tremors Low potassium level--muscle pain or cramps, unusual weakness or fatigue, fast or irregular heartbeat, constipation Redness, blistering, peeling, or loosening of the skin, including inside the mouth Skin reactions on sun-exposed areas Side effects that usually do not require medical attention (report to your care team if they continue or are bothersome): Change in nail shape, thickness, or color Diarrhea Dry skin Fatigue Nausea Vomiting This list may not describe all possible side effects. Call your doctor for medical advice about side effects. You may report side effects to FDA at 1-800-FDA-1088. Where should I keep my  medication? This medication is given in a hospital or clinic. It will not be stored at home. NOTE: This sheet is a summary. It may not cover all possible information. If you have questions about this medicine, talk to your doctor, pharmacist, or health care provider.  2023 Elsevier/Gold Standard (2022-02-27 00:00:00)        To help prevent nausea and vomiting after your treatment, we encourage you to take your nausea medication as directed.  BELOW ARE SYMPTOMS THAT SHOULD BE REPORTED IMMEDIATELY: *FEVER GREATER THAN 100.4 F (38 C) OR HIGHER *CHILLS OR SWEATING *NAUSEA AND VOMITING THAT IS NOT CONTROLLED WITH YOUR NAUSEA MEDICATION *UNUSUAL SHORTNESS OF BREATH *UNUSUAL BRUISING OR BLEEDING *URINARY PROBLEMS (pain or burning when urinating, or frequent urination) *BOWEL PROBLEMS (unusual diarrhea, constipation, pain near the anus) TENDERNESS IN MOUTH AND THROAT WITH OR WITHOUT PRESENCE OF ULCERS (sore throat, sores in mouth, or a toothache) UNUSUAL RASH, SWELLING OR PAIN  UNUSUAL VAGINAL DISCHARGE OR ITCHING   Items with * indicate a potential emergency and should be followed up as soon as possible or go to the Emergency Department if any problems should occur.  Please show the CHEMOTHERAPY ALERT CARD or IMMUNOTHERAPY ALERT CARD at check-in to the Emergency Department and triage nurse.  Should  you have questions after your visit or need to cancel or reschedule your appointment, please contact [facility] [phone]  and follow the prompts.  Office hours are 8:00 a.m. to 4:30 p.m. Monday - Friday. Please note that voicemails left after 4:00 p.m. may not be returned until the following business day.  We are closed weekends and major holidays. You have access to a nurse at all times for urgent questions. Please call the main number to the clinic [phone] and follow the prompts.  For any non-urgent questions, you may also contact your provider using MyChart. We now  offer e-Visits for anyone 43 and older to request care online for non-urgent symptoms. For details visit [email or web address].   Also download the MyChart app! Go to the app store, search "MyChart", open the app, select [facility], and log in with your MyChart username and password.

## 2023-02-08 ENCOUNTER — Other Ambulatory Visit (HOSPITAL_COMMUNITY): Payer: Self-pay

## 2023-02-08 ENCOUNTER — Telehealth: Payer: Self-pay | Admitting: *Deleted

## 2023-02-08 ENCOUNTER — Other Ambulatory Visit: Payer: Self-pay

## 2023-02-08 DIAGNOSIS — G4733 Obstructive sleep apnea (adult) (pediatric): Secondary | ICD-10-CM | POA: Diagnosis not present

## 2023-02-08 LAB — 10-HYDROXYCARBAZEPINE: Triliptal/MTB(Oxcarbazepin): 7.2 ug/mL — ABNORMAL LOW (ref 8.0–35.0)

## 2023-02-08 NOTE — Telephone Encounter (Signed)
Patient successfully OnBoarded and drug education provided by pharmacist. Carlos Miranda scheduled to be shipped 02/09/23 for delivery on 02/12/23 from [facility]. Patient knows not start until 02/21/23 (or when MD tells them to). Patient also knows to call me at [phone] with any questions or concerns regarding receiving medication or if there are any co-pay changes.   Ardeen Fillers, CPhT Oncology Pharmacy Patient Advocate  [facility]  [phone] (phone) [phone] (fax) 02/08/2023 1:42 PM

## 2023-02-08 NOTE — Telephone Encounter (Addendum)
Oral Chemotherapy Pharmacist Encounter  Patient Education I spoke with patient for overview of new oral chemotherapy medication: Braftovi (encorafenib) for the treatment of metastatic colon cancer, BRAF v600E positive in conjunction with panitumumab, planned duration until disease progression or unacceptable drug toxicity. Encorafenib planned start on 02/21/23, 2 weeks after panitumumab per MD note.   Counseled patient on administration, dosing, side effects, monitoring, drug-food interactions, safe handling, storage, and disposal. Patient will take 3 capsules (225 mg total) by mouth daily.   Patient will also begin taking doxycycline 1 tablet (100 mg total) by mouth 2 (two) times daily. Take for prevention of acneform rash.  Reviewed drug interaction in assessment note with patient.   Side effects include but not limited to: diarrhea, acne-like rash, fatigue, nausea.    Reviewed with patient importance of keeping a medication schedule and plan for any missed doses.  After discussion with patient no patient barriers to medication adherence identified.   Carlos Miranda voiced understanding and appreciation. All questions answered. Medication handout provided.  Provided patient with Oral Chemotherapy Navigation Clinic phone number. Patient knows to call the office with questions or concerns. Oral Chemotherapy Navigation Clinic will continue to follow.  Carlos Miranda, PharmD, BCPS, BCOP, CPP Hematology/Oncology Clinical Pharmacist Practitioner [place]/DB/AP Oral Chemotherapy Navigation Clinic [phone]  02/08/2023 1:28 PM

## 2023-02-08 NOTE — Telephone Encounter (Signed)
Tried to call patient today for 24 hour chemotherapy follow-up. Pt did not answer and no VM was set up to leave a message.

## 2023-02-09 ENCOUNTER — Other Ambulatory Visit: Payer: Self-pay

## 2023-02-09 DIAGNOSIS — R051 Acute cough: Secondary | ICD-10-CM | POA: Diagnosis not present

## 2023-02-09 DIAGNOSIS — J0100 Acute maxillary sinusitis, unspecified: Secondary | ICD-10-CM | POA: Diagnosis not present

## 2023-02-09 DIAGNOSIS — R062 Wheezing: Secondary | ICD-10-CM | POA: Diagnosis not present

## 2023-02-09 LAB — CEA: CEA: 10.5 ng/mL — ABNORMAL HIGH (ref 0.0–4.7)
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
## 2023-02-21 ENCOUNTER — Inpatient Hospital Stay: Payer: Medicare Other

## 2023-02-21 ENCOUNTER — Ambulatory Visit: Payer: Medicaid Other | Admitting: Hematology

## 2023-02-21 ENCOUNTER — Ambulatory Visit: Payer: Medicaid Other

## 2023-02-21 ENCOUNTER — Inpatient Hospital Stay (HOSPITAL_BASED_OUTPATIENT_CLINIC_OR_DEPARTMENT_OTHER): Payer: Medicare Other | Admitting: Hematology

## 2023-02-21 ENCOUNTER — Other Ambulatory Visit: Payer: Medicaid Other

## 2023-02-21 VITALS — BP 127/83 | HR 79 | Temp 97.3°F | Resp 20

## 2023-02-21 DIAGNOSIS — C182 Malignant neoplasm of ascending colon: Secondary | ICD-10-CM | POA: Diagnosis not present

## 2023-02-21 DIAGNOSIS — Z5112 Encounter for antineoplastic immunotherapy: Secondary | ICD-10-CM | POA: Diagnosis not present

## 2023-02-21 LAB — COMPREHENSIVE METABOLIC PANEL
ALT: 20 U/L (ref 0–44)
AST: 22 U/L (ref 15–41)
Albumin: 3.7 g/dL (ref 3.5–5.0)
Alkaline Phosphatase: 111 U/L (ref 38–126)
Anion gap: 7 (ref 5–15)
BUN: 8 mg/dL (ref 8–23)
CO2: 26 mmol/L (ref 22–32)
Calcium: 8.4 mg/dL — ABNORMAL LOW (ref 8.9–10.3)
Chloride: 104 mmol/L (ref 98–111)
Creatinine, Ser: 0.77 mg/dL (ref 0.61–1.24)
GFR, Estimated: >60 mL/min (ref >60)
Glucose, Bld: 114 mg/dL — ABNORMAL HIGH (ref 70–99)
Potassium: 3.1 mmol/L — ABNORMAL LOW (ref 3.5–5.1)
Sodium: 137 mmol/L (ref 135–145)
Total Bilirubin: 0.1 mg/dL — ABNORMAL LOW (ref 0.3–1.2)
Total Protein: 6.9 g/dL (ref 6.5–8.1)

## 2023-02-21 LAB — CBC WITH DIFFERENTIAL/PLATELET
Abs Immature Granulocytes: 0.01 10*3/uL (ref 0.00–0.07)
Basophils Absolute: 0.1 10*3/uL (ref 0.0–0.1)
Basophils Relative: 1 %
Eosinophils Absolute: 0.5 10*3/uL (ref 0.0–0.5)
Eosinophils Relative: 10 %
HCT: 47.1 % (ref 39.0–52.0)
Hemoglobin: 15.9 g/dL (ref 13.0–17.0)
Immature Granulocytes: 0 %
Lymphocytes Relative: 26 %
Lymphs Abs: 1.3 10*3/uL (ref 0.7–4.0)
MCH: 31.9 pg (ref 26.0–34.0)
MCHC: 33.8 g/dL (ref 30.0–36.0)
MCV: 94.6 fL (ref 80.0–100.0)
Monocytes Absolute: 0.4 10*3/uL (ref 0.1–1.0)
Monocytes Relative: 9 %
Neutro Abs: 2.7 10*3/uL (ref 1.7–7.7)
Neutrophils Relative %: 54 %
Platelets: 189 10*3/uL (ref 150–400)
RBC: 4.98 MIL/uL (ref 4.22–5.81)
RDW: 12.9 % (ref 11.5–15.5)
WBC: 5 10*3/uL (ref 4.0–10.5)
nRBC: 0 % (ref 0.0–0.2)

## 2023-02-21 LAB — MAGNESIUM: Magnesium: 1.8 mg/dL (ref 1.7–2.4)

## 2023-02-21 MED ORDER — POTASSIUM CHLORIDE CRYS ER 20 MEQ PO TBCR
40.0000 meq | EXTENDED_RELEASE_TABLET | Freq: Once | ORAL | Status: AC
  Administered 2023-02-21: 40 meq via ORAL
  Filled 2023-02-21: qty 2

## 2023-02-21 MED ORDER — HEPARIN SOD (PORK) LOCK FLUSH 100 UNIT/ML IV SOLN
500.0000 [IU] | Freq: Once | INTRAVENOUS | Status: AC | PRN
  Administered 2023-02-21: 500 [IU]

## 2023-02-21 MED ORDER — CLINDAMYCIN PHOSPHATE 1 % EX GEL: Freq: Two times a day (BID) | CUTANEOUS | 3 refills | Status: DC

## 2023-02-21 MED ORDER — SODIUM CHLORIDE 0.9% FLUSH
10.0000 mL | INTRAVENOUS | Status: DC | PRN
  Administered 2023-02-21: 10 mL

## 2023-02-21 MED ORDER — SODIUM CHLORIDE 0.9% FLUSH
10.0000 mL | Freq: Once | INTRAVENOUS | Status: AC
  Administered 2023-02-21: 10 mL via INTRAVENOUS

## 2023-02-21 MED ORDER — SODIUM CHLORIDE 0.9 % IV SOLN
6.0000 mg/kg | Freq: Once | INTRAVENOUS | Status: AC
  Administered 2023-02-21: 700 mg via INTRAVENOUS
  Filled 2023-02-21: qty 20

## 2023-02-21 MED ORDER — SODIUM CHLORIDE 0.9 % IV SOLN: Freq: Once | INTRAVENOUS | Status: AC

## 2023-02-21 NOTE — Progress Notes (Signed)
Patient presents today for Vectibix and follow up visit with Dr. Ellin Saba. Labs and vital signs within parameters for treatment. Potassium 3.1 today. Standing orders for 40 mEq of K-DUR PO x 1 dose .    Treatment given today per MD orders. Tolerated infusion without adverse affects. Vital signs stable. No complaints at this time. Discharged from clinic ambulatory in stable condition. Alert and oriented x 3. F/U with [facility] as scheduled.

## 2023-02-21 NOTE — Progress Notes (Signed)
Patient has been examined by Dr. Katragadda. Vital signs and labs have been reviewed by MD - ANC, Creatinine, LFTs, hemoglobin, and platelets are within treatment parameters per M.D. - pt may proceed with treatment.  Primary RN and pharmacy notified.  

## 2023-02-21 NOTE — Patient Instructions (Signed)
[facility]  Discharge Instructions: Thank you for choosing [facility] to provide your oncology and hematology care.  If you have a lab appointment with the Cancer Center - please note that after April 8th, 2024, all labs will be drawn in the cancer center.  You do not have to check in or register with the main entrance as you have in the past but will complete your check-in in the cancer center.  Wear comfortable clothing and clothing appropriate for easy access to any Portacath or PICC line.   We strive to give you quality time with your provider. You may need to reschedule your appointment if you arrive late (15 or more minutes).  Arriving late affects you and other patients whose appointments are after yours.  Also, if you miss three or more appointments without notifying the office, you may be dismissed from the clinic at the provider's discretion.      For prescription refill requests, have your pharmacy contact our office and allow 72 hours for refills to be completed.    Today you received the following chemotherapy and/or immunotherapy agents Vectibix.  Panitumumab Injection What is this medication? PANITUMUMAB (pan i TOOM ue mab) treats colorectal cancer. It works by blocking a protein that causes cancer cells to grow and multiply. This helps to slow or stop the spread of cancer cells. It is a monoclonal antibody. This medicine may be used for other purposes; ask your health care provider or pharmacist if you have questions. COMMON BRAND NAME(S): Vectibix What should I tell my care team before I take this medication? They need to know if you have any of these conditions: Eye disease Low levels of magnesium in the blood Lung disease An unusual or allergic reaction to panitumumab, other medications, foods, dyes, or preservatives Pregnant or trying to get pregnant Breast-feeding How should I use this medication? This medication is injected into a  vein. It is given by your care team in a hospital or clinic setting. Talk to your care team about the use of this medication in children. Special care may be needed. Overdosage: If you think you have taken too much of this medicine contact a poison control center or emergency room at once. NOTE: This medicine is only for you. Do not share this medicine with others. What if I miss a dose? Keep appointments for follow-up doses. It is important not to miss your dose. Call your care team if you are unable to keep an appointment. What may interact with this medication? Bevacizumab This list may not describe all possible interactions. Give your health care provider a list of all the medicines, herbs, non-prescription drugs, or dietary supplements you use. Also tell them if you smoke, drink alcohol, or use illegal drugs. Some items may interact with your medicine. What should I watch for while using this medication? Your condition will be monitored carefully while you are receiving this medication. This medication may make you feel generally unwell. This is not uncommon as chemotherapy can affect healthy cells as well as cancer cells. Report any side effects. Continue your course of treatment even though you feel ill unless your care team tells you to stop. This medication can make you more sensitive to the sun. Keep out of the sun while receiving this medication and for 2 months after stopping therapy. If you cannot avoid being in the sun, wear protective clothing and sunscreen. Do not use sun lamps, tanning beds, or tanning booths.   Check with your care team if you have severe diarrhea, nausea, and vomiting or if you sweat a lot. The loss of too much body fluid may make it dangerous for you to take this medication. This medication may cause serious skin reactions. They can happen weeks to months after starting the medication. Contact your care team right away if you notice fevers or flu-like symptoms with a  rash. The rash may be red or purple and then turn into blisters or peeling of the skin. You may also notice a red rash with swelling of the face, lips, or lymph nodes in your neck or under your arms. Talk to your care team if you may be pregnant. Serious birth defects can occur if you take this medication during pregnancy and for 2 months after the last dose. Contraception is recommended while taking this medication and for 2 months after the last dose. Your care team can help you find the option that works for you. Do not breastfeed while taking this medication and for 2 months after the last dose. This medication may cause infertility. Talk to your care team if you are concerned about your fertility. What side effects may I notice from receiving this medication? Side effects that you should report to your care team as soon as possible: Allergic reactions--skin rash, itching, hives, swelling of the face, lips, tongue, or throat Dry cough, shortness of breath or trouble breathing Eye pain, redness, irritation, or discharge with blurry or decreased vision Infusion reactions--chest pain, shortness of breath or trouble breathing, feeling faint or lightheaded Low magnesium level--muscle pain or cramps, unusual weakness or fatigue, fast or irregular heartbeat, tremors Low potassium level--muscle pain or cramps, unusual weakness or fatigue, fast or irregular heartbeat, constipation Redness, blistering, peeling, or loosening of the skin, including inside the mouth Skin reactions on sun-exposed areas Side effects that usually do not require medical attention (report to your care team if they continue or are bothersome): Change in nail shape, thickness, or color Diarrhea Dry skin Fatigue Nausea Vomiting This list may not describe all possible side effects. Call your doctor for medical advice about side effects. You may report side effects to FDA at 1-800-FDA-1088. Where should I keep my  medication? This medication is given in a hospital or clinic. It will not be stored at home. NOTE: This sheet is a summary. It may not cover all possible information. If you have questions about this medicine, talk to your doctor, pharmacist, or health care provider.  2023 Elsevier/Gold Standard (2022-02-27 00:00:00)        To help prevent nausea and vomiting after your treatment, we encourage you to take your nausea medication as directed.  BELOW ARE SYMPTOMS THAT SHOULD BE REPORTED IMMEDIATELY: *FEVER GREATER THAN 100.4 F (38 C) OR HIGHER *CHILLS OR SWEATING *NAUSEA AND VOMITING THAT IS NOT CONTROLLED WITH YOUR NAUSEA MEDICATION *UNUSUAL SHORTNESS OF BREATH *UNUSUAL BRUISING OR BLEEDING *URINARY PROBLEMS (pain or burning when urinating, or frequent urination) *BOWEL PROBLEMS (unusual diarrhea, constipation, pain near the anus) TENDERNESS IN MOUTH AND THROAT WITH OR WITHOUT PRESENCE OF ULCERS (sore throat, sores in mouth, or a toothache) UNUSUAL RASH, SWELLING OR PAIN  UNUSUAL VAGINAL DISCHARGE OR ITCHING   Items with * indicate a potential emergency and should be followed up as soon as possible or go to the Emergency Department if any problems should occur.  Please show the CHEMOTHERAPY ALERT CARD or IMMUNOTHERAPY ALERT CARD at check-in to the Emergency Department and triage nurse.  Should   you have questions after your visit or need to cancel or reschedule your appointment, please contact [facility] [phone]  and follow the prompts.  Office hours are 8:00 a.m. to 4:30 p.m. Monday - Friday. Please note that voicemails left after 4:00 p.m. may not be returned until the following business day.  We are closed weekends and major holidays. You have access to a nurse at all times for urgent questions. Please call the main number to the clinic [phone] and follow the prompts.  For any non-urgent questions, you may also contact your provider using MyChart. We now  offer e-Visits for anyone 18 and older to request care online for non-urgent symptoms. For details visit [email or web address].   Also download the MyChart app! Go to the app store, search "MyChart", open the app, select [facility], and log in with your MyChart username and password.   

## 2023-02-21 NOTE — Patient Instructions (Addendum)
[facility] Discharge Instructions   You were seen and examined today by Dr. Ellin Saba.  He reviewed the results of your lab work which are normal/stable.   We will proceed with your treatment today.   Start taking Braftovi tomorrow.   Return as scheduled.    Thank you for choosing [facility] to provide your oncology and hematology care.  To afford each patient quality time with our provider, please arrive at least 15 minutes before your scheduled appointment time.   If you have a lab appointment with the Cancer Center please come in thru the Main Entrance and check in at the main information desk.  You need to re-schedule your appointment should you arrive 10 or more minutes late.  We strive to give you quality time with our providers, and arriving late affects you and other patients whose appointments are after yours.  Also, if you no show three or more times for appointments you may be dismissed from the clinic at the providers discretion.     Again, thank you for choosing [facility].  Our hope is that these requests will decrease the amount of time that you wait before being seen by our physicians.       _____________________________________________________________  Should you have questions after your visit to [facility], please contact our office at [phone] and follow the prompts.  Our office hours are 8:00 a.m. and 4:30 p.m. Monday - Friday.  Please note that voicemails left after 4:00 p.m. may not be returned until the following business day.  We are closed weekends and major holidays.  You do have access to a nurse 24-7, just call the main number to the clinic [phone] and do not press any options, hold on the line and a nurse will answer the phone.    For prescription refill requests, have your pharmacy contact our office and allow 72 hours.    Due to Covid, you will  need to wear a mask upon entering the hospital. If you do not have a mask, a mask will be given to you at the Main Entrance upon arrival. For doctor visits, patients may have 1 support person age 11 or older with them. For treatment visits, patients can not have anyone with them due to social distancing guidelines and our immunocompromised population.

## 2023-03-07 ENCOUNTER — Inpatient Hospital Stay: Payer: Medicare Other

## 2023-03-07 ENCOUNTER — Inpatient Hospital Stay: Payer: Medicare Other | Attending: Hematology

## 2023-03-07 VITALS — BP 118/75 | HR 98 | Temp 98.7°F | Resp 20 | Wt 231.2 lb

## 2023-03-07 VITALS — BP 124/80 | HR 91 | Temp 97.7°F | Resp 18

## 2023-03-07 DIAGNOSIS — Z888 Allergy status to other drugs, medicaments and biological substances status: Secondary | ICD-10-CM | POA: Diagnosis not present

## 2023-03-07 DIAGNOSIS — E785 Hyperlipidemia, unspecified: Secondary | ICD-10-CM | POA: Diagnosis not present

## 2023-03-07 DIAGNOSIS — Z8269 Family history of other diseases of the musculoskeletal system and connective tissue: Secondary | ICD-10-CM | POA: Insufficient documentation

## 2023-03-07 DIAGNOSIS — Z87891 Personal history of nicotine dependence: Secondary | ICD-10-CM | POA: Insufficient documentation

## 2023-03-07 DIAGNOSIS — Z803 Family history of malignant neoplasm of breast: Secondary | ICD-10-CM | POA: Insufficient documentation

## 2023-03-07 DIAGNOSIS — Z880 Allergy status to penicillin: Secondary | ICD-10-CM | POA: Insufficient documentation

## 2023-03-07 DIAGNOSIS — Z9049 Acquired absence of other specified parts of digestive tract: Secondary | ICD-10-CM | POA: Diagnosis not present

## 2023-03-07 DIAGNOSIS — Z1501 Genetic susceptibility to malignant neoplasm of breast: Secondary | ICD-10-CM | POA: Insufficient documentation

## 2023-03-07 DIAGNOSIS — Z95828 Presence of other vascular implants and grafts: Secondary | ICD-10-CM

## 2023-03-07 DIAGNOSIS — I10 Essential (primary) hypertension: Secondary | ICD-10-CM | POA: Diagnosis not present

## 2023-03-07 DIAGNOSIS — Z8249 Family history of ischemic heart disease and other diseases of the circulatory system: Secondary | ICD-10-CM | POA: Diagnosis not present

## 2023-03-07 DIAGNOSIS — C182 Malignant neoplasm of ascending colon: Secondary | ICD-10-CM | POA: Insufficient documentation

## 2023-03-07 DIAGNOSIS — Z5112 Encounter for antineoplastic immunotherapy: Secondary | ICD-10-CM | POA: Diagnosis present

## 2023-03-07 DIAGNOSIS — Z1509 Genetic susceptibility to other malignant neoplasm: Secondary | ICD-10-CM | POA: Diagnosis not present

## 2023-03-07 DIAGNOSIS — Z79899 Other long term (current) drug therapy: Secondary | ICD-10-CM | POA: Diagnosis not present

## 2023-03-07 DIAGNOSIS — Z882 Allergy status to sulfonamides status: Secondary | ICD-10-CM | POA: Diagnosis not present

## 2023-03-07 DIAGNOSIS — R21 Rash and other nonspecific skin eruption: Secondary | ICD-10-CM | POA: Insufficient documentation

## 2023-03-07 DIAGNOSIS — G629 Polyneuropathy, unspecified: Secondary | ICD-10-CM | POA: Diagnosis not present

## 2023-03-07 DIAGNOSIS — Z8261 Family history of arthritis: Secondary | ICD-10-CM | POA: Insufficient documentation

## 2023-03-07 LAB — CBC WITH DIFFERENTIAL/PLATELET
Abs Immature Granulocytes: 0.12 10*3/uL — ABNORMAL HIGH (ref 0.00–0.07)
Basophils Absolute: 0.1 10*3/uL (ref 0.0–0.1)
Basophils Relative: 1 %
Eosinophils Absolute: 0.4 10*3/uL (ref 0.0–0.5)
Eosinophils Relative: 6 %
HCT: 48.5 % (ref 39.0–52.0)
Hemoglobin: 16.5 g/dL (ref 13.0–17.0)
Immature Granulocytes: 2 %
Lymphocytes Relative: 23 %
Lymphs Abs: 1.4 10*3/uL (ref 0.7–4.0)
MCH: 32.3 pg (ref 26.0–34.0)
MCHC: 34 g/dL (ref 30.0–36.0)
MCV: 94.9 fL (ref 80.0–100.0)
Monocytes Absolute: 0.6 10*3/uL (ref 0.1–1.0)
Monocytes Relative: 9 %
Neutro Abs: 3.6 10*3/uL (ref 1.7–7.7)
Neutrophils Relative %: 59 %
Platelets: 268 10*3/uL (ref 150–400)
RBC: 5.11 MIL/uL (ref 4.22–5.81)
RDW: 13.4 % (ref 11.5–15.5)
WBC: 6.2 10*3/uL (ref 4.0–10.5)
nRBC: 0 % (ref 0.0–0.2)

## 2023-03-07 LAB — COMPREHENSIVE METABOLIC PANEL
ALT: 18 U/L (ref 0–44)
AST: 17 U/L (ref 15–41)
Albumin: 3.1 g/dL — ABNORMAL LOW (ref 3.5–5.0)
Alkaline Phosphatase: 103 U/L (ref 38–126)
Anion gap: 5 (ref 5–15)
BUN: 13 mg/dL (ref 8–23)
CO2: 23 mmol/L (ref 22–32)
Calcium: 7.9 mg/dL — ABNORMAL LOW (ref 8.9–10.3)
Chloride: 105 mmol/L (ref 98–111)
Creatinine, Ser: 0.86 mg/dL (ref 0.61–1.24)
GFR, Estimated: >60 mL/min (ref >60)
Glucose, Bld: 128 mg/dL — ABNORMAL HIGH (ref 70–99)
Potassium: 3.5 mmol/L (ref 3.5–5.1)
Sodium: 133 mmol/L — ABNORMAL LOW (ref 135–145)
Total Bilirubin: 0.6 mg/dL (ref 0.3–1.2)
Total Protein: 6.3 g/dL — ABNORMAL LOW (ref 6.5–8.1)

## 2023-03-07 LAB — MAGNESIUM: Magnesium: 1.6 mg/dL — ABNORMAL LOW (ref 1.7–2.4)

## 2023-03-07 MED ORDER — SODIUM CHLORIDE 0.9% FLUSH
10.0000 mL | INTRAVENOUS | Status: DC | PRN
  Administered 2023-03-07: 10 mL

## 2023-03-07 MED ORDER — SODIUM CHLORIDE 0.9 % IV SOLN: Freq: Once | INTRAVENOUS | Status: AC

## 2023-03-07 MED ORDER — HEPARIN SOD (PORK) LOCK FLUSH 100 UNIT/ML IV SOLN
500.0000 [IU] | Freq: Once | INTRAVENOUS | Status: AC | PRN
  Administered 2023-03-07: 500 [IU]

## 2023-03-07 MED ORDER — SODIUM CHLORIDE 0.9% FLUSH
10.0000 mL | Freq: Once | INTRAVENOUS | Status: AC
  Administered 2023-03-07: 10 mL via INTRAVENOUS

## 2023-03-07 MED ORDER — MAGNESIUM SULFATE 2 GM/50ML IV SOLN
2.0000 g | Freq: Once | INTRAVENOUS | Status: AC
  Administered 2023-03-07: 2 g via INTRAVENOUS
  Filled 2023-03-07: qty 50

## 2023-03-07 MED ORDER — SODIUM CHLORIDE 0.9 % IV SOLN
6.0000 mg/kg | Freq: Once | INTRAVENOUS | Status: AC
  Administered 2023-03-07: 700 mg via INTRAVENOUS
  Filled 2023-03-07: qty 20

## 2023-03-07 NOTE — Progress Notes (Signed)
Maintain dose today at 700 mg (6 mg/kg) despite slight weight loss.  T.O. Dr Carilyn Goodpasture, PharmD

## 2023-03-07 NOTE — Patient Instructions (Signed)
[facility]  Discharge Instructions: Thank you for choosing [facility] to provide your oncology and hematology care.  If you have a lab appointment with the Cancer Center - please note that after April 8th, 2024, all labs will be drawn in the cancer center.  You do not have to check in or register with the main entrance as you have in the past but will complete your check-in in the cancer center.  Wear comfortable clothing and clothing appropriate for easy access to any Portacath or PICC line.   We strive to give you quality time with your provider. You may need to reschedule your appointment if you arrive late (15 or more minutes).  Arriving late affects you and other patients whose appointments are after yours.  Also, if you miss three or more appointments without notifying the office, you may be dismissed from the clinic at the provider's discretion.      For prescription refill requests, have your pharmacy contact our office and allow 72 hours for refills to be completed.    Today you received the following chemotherapy and/or immunotherapy agents Vectibix   To help prevent nausea and vomiting after your treatment, we encourage you to take your nausea medication as directed.  Panitumumab Injection What is this medication? PANITUMUMAB (pan i TOOM ue mab) treats colorectal cancer. It works by blocking a protein that causes cancer cells to grow and multiply. This helps to slow or stop the spread of cancer cells. It is a monoclonal antibody. This medicine may be used for other purposes; ask your health care provider or pharmacist if you have questions. COMMON BRAND NAME(S): Vectibix What should I tell my care team before I take this medication? They need to know if you have any of these conditions: Eye disease Low levels of magnesium in the blood Lung disease An unusual or allergic reaction to panitumumab, other medications, foods, dyes, or  preservatives Pregnant or trying to get pregnant Breast-feeding How should I use this medication? This medication is injected into a vein. It is given by your care team in a hospital or clinic setting. Talk to your care team about the use of this medication in children. Special care may be needed. Overdosage: If you think you have taken too much of this medicine contact a poison control center or emergency room at once. NOTE: This medicine is only for you. Do not share this medicine with others. What if I miss a dose? Keep appointments for follow-up doses. It is important not to miss your dose. Call your care team if you are unable to keep an appointment. What may interact with this medication? Bevacizumab This list may not describe all possible interactions. Give your health care provider a list of all the medicines, herbs, non-prescription drugs, or dietary supplements you use. Also tell them if you smoke, drink alcohol, or use illegal drugs. Some items may interact with your medicine. What should I watch for while using this medication? Your condition will be monitored carefully while you are receiving this medication. This medication may make you feel generally unwell. This is not uncommon as chemotherapy can affect healthy cells as well as cancer cells. Report any side effects. Continue your course of treatment even though you feel ill unless your care team tells you to stop. This medication can make you more sensitive to the sun. Keep out of the sun while receiving this medication and for 2 months after stopping therapy. If you  cannot avoid being in the sun, wear protective clothing and sunscreen. Do not use sun lamps, tanning beds, or tanning booths. Check with your care team if you have severe diarrhea, nausea, and vomiting or if you sweat a lot. The loss of too much body fluid may make it dangerous for you to take this medication. This medication may cause serious skin reactions. They can  happen weeks to months after starting the medication. Contact your care team right away if you notice fevers or flu-like symptoms with a rash. The rash may be red or purple and then turn into blisters or peeling of the skin. You may also notice a red rash with swelling of the face, lips, or lymph nodes in your neck or under your arms. Talk to your care team if you may be pregnant. Serious birth defects can occur if you take this medication during pregnancy and for 2 months after the last dose. Contraception is recommended while taking this medication and for 2 months after the last dose. Your care team can help you find the option that works for you. Do not breastfeed while taking this medication and for 2 months after the last dose. This medication may cause infertility. Talk to your care team if you are concerned about your fertility. What side effects may I notice from receiving this medication? Side effects that you should report to your care team as soon as possible: Allergic reactions--skin rash, itching, hives, swelling of the face, lips, tongue, or throat Dry cough, shortness of breath or trouble breathing Eye pain, redness, irritation, or discharge with blurry or decreased vision Infusion reactions--chest pain, shortness of breath or trouble breathing, feeling faint or lightheaded Low magnesium level--muscle pain or cramps, unusual weakness or fatigue, fast or irregular heartbeat, tremors Low potassium level--muscle pain or cramps, unusual weakness or fatigue, fast or irregular heartbeat, constipation Redness, blistering, peeling, or loosening of the skin, including inside the mouth Skin reactions on sun-exposed areas Side effects that usually do not require medical attention (report to your care team if they continue or are bothersome): Change in nail shape, thickness, or color Diarrhea Dry skin Fatigue Nausea Vomiting This list may not describe all possible side effects. Call your  doctor for medical advice about side effects. You may report side effects to FDA at 1-800-FDA-1088. Where should I keep my medication? This medication is given in a hospital or clinic. It will not be stored at home. NOTE: This sheet is a summary. It may not cover all possible information. If you have questions about this medicine, talk to your doctor, pharmacist, or health care provider.  2023 Elsevier/Gold Standard (2022-02-27 00:00:00)   BELOW ARE SYMPTOMS THAT SHOULD BE REPORTED IMMEDIATELY: *FEVER GREATER THAN 100.4 F (38 C) OR HIGHER *CHILLS OR SWEATING *NAUSEA AND VOMITING THAT IS NOT CONTROLLED WITH YOUR NAUSEA MEDICATION *UNUSUAL SHORTNESS OF BREATH *UNUSUAL BRUISING OR BLEEDING *URINARY PROBLEMS (pain or burning when urinating, or frequent urination) *BOWEL PROBLEMS (unusual diarrhea, constipation, pain near the anus) TENDERNESS IN MOUTH AND THROAT WITH OR WITHOUT PRESENCE OF ULCERS (sore throat, sores in mouth, or a toothache) UNUSUAL RASH, SWELLING OR PAIN  UNUSUAL VAGINAL DISCHARGE OR ITCHING   Items with * indicate a potential emergency and should be followed up as soon as possible or go to the Emergency Department if any problems should occur.  Please show the CHEMOTHERAPY ALERT CARD or IMMUNOTHERAPY ALERT CARD at check-in to the Emergency Department and triage nurse.  Should you have questions after  your visit or need to cancel or reschedule your appointment, please contact [facility] [phone]  and follow the prompts.  Office hours are 8:00 a.m. to 4:30 p.m. Monday - Friday. Please note that voicemails left after 4:00 p.m. may not be returned until the following business day.  We are closed weekends and major holidays. You have access to a nurse at all times for urgent questions. Please call the main number to the clinic [phone] and follow the prompts.  For any non-urgent questions, you may also contact your provider using MyChart. We now  offer e-Visits for anyone 19 and older to request care online for non-urgent symptoms. For details visit [email or web address].   Also download the MyChart app! Go to the app store, search "MyChart", open the app, select [facility], and log in with your MyChart username and password.

## 2023-03-07 NOTE — Progress Notes (Signed)
Patient presents today for Vectibix infusion. Patient is in satisfactory condition with no new complaints voiced.  Vital signs are stable.  Labs reviewed and all labs are within treatment parameters. Pt's magnesium is 1.6 today per Dr.K's standing orders, pt will receive 2g IV magnesium sulfate.  We will proceed with treatment per MD orders.    Vecitbix and 2g IV magnesium sulfate given today per MD orders. Tolerated infusion without adverse affects. Vital signs stable. No complaints at this time. Discharged from clinic ambulatory in stable condition. Alert and oriented x 3. F/U with [facility] as scheduled.

## 2023-03-08 ENCOUNTER — Encounter: Payer: Self-pay | Admitting: Hematology

## 2023-03-12 ENCOUNTER — Other Ambulatory Visit: Payer: Self-pay

## 2023-03-14 ENCOUNTER — Encounter: Payer: Self-pay | Admitting: Hematology

## 2023-03-20 ENCOUNTER — Other Ambulatory Visit (HOSPITAL_COMMUNITY): Payer: Self-pay

## 2023-03-20 ENCOUNTER — Encounter: Payer: Self-pay | Admitting: Hematology
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
## 2023-03-21 ENCOUNTER — Other Ambulatory Visit: Payer: Self-pay

## 2023-03-21 ENCOUNTER — Inpatient Hospital Stay: Payer: Medicare Other

## 2023-03-21 ENCOUNTER — Other Ambulatory Visit (HOSPITAL_COMMUNITY): Payer: Self-pay

## 2023-03-21 ENCOUNTER — Inpatient Hospital Stay (HOSPITAL_BASED_OUTPATIENT_CLINIC_OR_DEPARTMENT_OTHER): Payer: Medicare Other | Admitting: Hematology

## 2023-03-21 ENCOUNTER — Encounter: Payer: Self-pay | Admitting: Hematology

## 2023-03-21 VITALS — BP 121/78 | HR 85 | Temp 97.8°F | Resp 18

## 2023-03-21 VITALS — BP 121/77 | HR 97 | Temp 98.2°F | Resp 18 | Wt 231.0 lb

## 2023-03-21 DIAGNOSIS — Z5112 Encounter for antineoplastic immunotherapy: Secondary | ICD-10-CM | POA: Diagnosis not present

## 2023-03-21 DIAGNOSIS — Z95828 Presence of other vascular implants and grafts: Secondary | ICD-10-CM

## 2023-03-21 DIAGNOSIS — C182 Malignant neoplasm of ascending colon: Secondary | ICD-10-CM | POA: Diagnosis not present

## 2023-03-21 DIAGNOSIS — Z79899 Other long term (current) drug therapy: Secondary | ICD-10-CM

## 2023-03-21 DIAGNOSIS — E876 Hypokalemia: Secondary | ICD-10-CM

## 2023-03-21 LAB — COMPREHENSIVE METABOLIC PANEL
ALT: 19 U/L (ref 0–44)
AST: 19 U/L (ref 15–41)
Albumin: 3.4 g/dL — ABNORMAL LOW (ref 3.5–5.0)
Alkaline Phosphatase: 88 U/L (ref 38–126)
Anion gap: 9 (ref 5–15)
BUN: 9 mg/dL (ref 8–23)
CO2: 24 mmol/L (ref 22–32)
Calcium: 8.4 mg/dL — ABNORMAL LOW (ref 8.9–10.3)
Chloride: 102 mmol/L (ref 98–111)
Creatinine, Ser: 0.81 mg/dL (ref 0.61–1.24)
GFR, Estimated: >60 mL/min (ref >60)
Glucose, Bld: 169 mg/dL — ABNORMAL HIGH (ref 70–99)
Potassium: 3.3 mmol/L — ABNORMAL LOW (ref 3.5–5.1)
Sodium: 135 mmol/L (ref 135–145)
Total Bilirubin: 0.7 mg/dL (ref 0.3–1.2)
Total Protein: 6.5 g/dL (ref 6.5–8.1)

## 2023-03-21 LAB — CBC WITH DIFFERENTIAL/PLATELET
Abs Immature Granulocytes: 0.06 10*3/uL (ref 0.00–0.07)
Basophils Absolute: 0.1 10*3/uL (ref 0.0–0.1)
Basophils Relative: 1 %
Eosinophils Absolute: 0.5 10*3/uL (ref 0.0–0.5)
Eosinophils Relative: 9 %
HCT: 48.1 % (ref 39.0–52.0)
Hemoglobin: 16.1 g/dL (ref 13.0–17.0)
Immature Granulocytes: 1 %
Lymphocytes Relative: 30 %
Lymphs Abs: 1.5 10*3/uL (ref 0.7–4.0)
MCH: 31.9 pg (ref 26.0–34.0)
MCHC: 33.5 g/dL (ref 30.0–36.0)
MCV: 95.4 fL (ref 80.0–100.0)
Monocytes Absolute: 0.4 10*3/uL (ref 0.1–1.0)
Monocytes Relative: 8 %
Neutro Abs: 2.6 10*3/uL (ref 1.7–7.7)
Neutrophils Relative %: 51 %
Platelets: 154 10*3/uL (ref 150–400)
RBC: 5.04 MIL/uL (ref 4.22–5.81)
RDW: 13.6 % (ref 11.5–15.5)
WBC: 5.1 10*3/uL (ref 4.0–10.5)
nRBC: 0 % (ref 0.0–0.2)

## 2023-03-21 LAB — MAGNESIUM: Magnesium: 1.6 mg/dL — ABNORMAL LOW (ref 1.7–2.4)

## 2023-03-21 MED ORDER — SODIUM CHLORIDE 0.9 % IV SOLN: Freq: Once | INTRAVENOUS | Status: AC

## 2023-03-21 MED ORDER — SODIUM CHLORIDE 0.9% FLUSH
10.0000 mL | INTRAVENOUS | Status: DC | PRN
  Administered 2023-03-21: 10 mL

## 2023-03-21 MED ORDER — HEPARIN SOD (PORK) LOCK FLUSH 100 UNIT/ML IV SOLN
500.0000 [IU] | Freq: Once | INTRAVENOUS | Status: AC | PRN
  Administered 2023-03-21: 500 [IU]

## 2023-03-21 MED ORDER — SODIUM CHLORIDE 0.9 % IV SOLN
6.0000 mg/kg | Freq: Once | INTRAVENOUS | Status: AC
  Administered 2023-03-21: 700 mg via INTRAVENOUS
  Filled 2023-03-21: qty 20

## 2023-03-21 MED ORDER — MAGNESIUM SULFATE 2 GM/50ML IV SOLN
2.0000 g | Freq: Once | INTRAVENOUS | Status: AC
  Administered 2023-03-21: 2 g via INTRAVENOUS
  Filled 2023-03-21: qty 50

## 2023-03-21 MED ORDER — POTASSIUM CHLORIDE CRYS ER 20 MEQ PO TBCR
40.0000 meq | EXTENDED_RELEASE_TABLET | Freq: Once | ORAL | Status: AC
  Administered 2023-03-21: 40 meq via ORAL
  Filled 2023-03-21: qty 2

## 2023-03-21 MED ORDER — SODIUM CHLORIDE 0.9% FLUSH
10.0000 mL | Freq: Once | INTRAVENOUS | Status: AC
  Administered 2023-03-21: 10 mL via INTRAVENOUS

## 2023-03-21 MED ORDER — MAGNESIUM OXIDE -MG SUPPLEMENT 400 (240 MG) MG PO TABS: 400.0000 mg | ORAL_TABLET | Freq: Every day | ORAL | 6 refills | Status: DC

## 2023-03-21 MED ORDER — BRAFTOVI 75 MG PO CAPS
300.0000 mg | ORAL_CAPSULE | Freq: Every day | ORAL | 0 refills | Status: DC
Start: 2023-03-21 — End: 2023-04-17
  Filled 2023-03-21: qty 180, 45d supply, fill #0
  Filled 2023-03-27: qty 120, 30d supply, fill #0

## 2023-03-21 NOTE — Patient Instructions (Signed)
[facility]  Discharge Instructions: Thank you for choosing [facility] to provide your oncology and hematology care.  If you have a lab appointment with the Cancer Center - please note that after April 8th, 2024, all labs will be drawn in the cancer center.  You do not have to check in or register with the main entrance as you have in the past but will complete your check-in in the cancer center.  Wear comfortable clothing and clothing appropriate for easy access to any Portacath or PICC line.   We strive to give you quality time with your provider. You may need to reschedule your appointment if you arrive late (15 or more minutes).  Arriving late affects you and other patients whose appointments are after yours.  Also, if you miss three or more appointments without notifying the office, you may be dismissed from the clinic at the provider's discretion.      For prescription refill requests, have your pharmacy contact our office and allow 72 hours for refills to be completed.    Today you received the following chemotherapy and/or immunotherapy agents Vectibix.  Panitumumab Injection What is this medication? PANITUMUMAB (pan i TOOM ue mab) treats colorectal cancer. It works by blocking a protein that causes cancer cells to grow and multiply. This helps to slow or stop the spread of cancer cells. It is a monoclonal antibody. This medicine may be used for other purposes; ask your health care provider or pharmacist if you have questions. COMMON BRAND NAME(S): Vectibix What should I tell my care team before I take this medication? They need to know if you have any of these conditions: Eye disease Low levels of magnesium in the blood Lung disease An unusual or allergic reaction to panitumumab, other medications, foods, dyes, or preservatives Pregnant or trying to get pregnant Breast-feeding How should I use this medication? This medication is injected into a  vein. It is given by your care team in a hospital or clinic setting. Talk to your care team about the use of this medication in children. Special care may be needed. Overdosage: If you think you have taken too much of this medicine contact a poison control center or emergency room at once. NOTE: This medicine is only for you. Do not share this medicine with others. What if I miss a dose? Keep appointments for follow-up doses. It is important not to miss your dose. Call your care team if you are unable to keep an appointment. What may interact with this medication? Bevacizumab This list may not describe all possible interactions. Give your health care provider a list of all the medicines, herbs, non-prescription drugs, or dietary supplements you use. Also tell them if you smoke, drink alcohol, or use illegal drugs. Some items may interact with your medicine. What should I watch for while using this medication? Your condition will be monitored carefully while you are receiving this medication. This medication may make you feel generally unwell. This is not uncommon as chemotherapy can affect healthy cells as well as cancer cells. Report any side effects. Continue your course of treatment even though you feel ill unless your care team tells you to stop. This medication can make you more sensitive to the sun. Keep out of the sun while receiving this medication and for 2 months after stopping therapy. If you cannot avoid being in the sun, wear protective clothing and sunscreen. Do not use sun lamps, tanning beds, or tanning booths.   Check with your care team if you have severe diarrhea, nausea, and vomiting or if you sweat a lot. The loss of too much body fluid may make it dangerous for you to take this medication. This medication may cause serious skin reactions. They can happen weeks to months after starting the medication. Contact your care team right away if you notice fevers or flu-like symptoms with a  rash. The rash may be red or purple and then turn into blisters or peeling of the skin. You may also notice a red rash with swelling of the face, lips, or lymph nodes in your neck or under your arms. Talk to your care team if you may be pregnant. Serious birth defects can occur if you take this medication during pregnancy and for 2 months after the last dose. Contraception is recommended while taking this medication and for 2 months after the last dose. Your care team can help you find the option that works for you. Do not breastfeed while taking this medication and for 2 months after the last dose. This medication may cause infertility. Talk to your care team if you are concerned about your fertility. What side effects may I notice from receiving this medication? Side effects that you should report to your care team as soon as possible: Allergic reactions--skin rash, itching, hives, swelling of the face, lips, tongue, or throat Dry cough, shortness of breath or trouble breathing Eye pain, redness, irritation, or discharge with blurry or decreased vision Infusion reactions--chest pain, shortness of breath or trouble breathing, feeling faint or lightheaded Low magnesium level--muscle pain or cramps, unusual weakness or fatigue, fast or irregular heartbeat, tremors Low potassium level--muscle pain or cramps, unusual weakness or fatigue, fast or irregular heartbeat, constipation Redness, blistering, peeling, or loosening of the skin, including inside the mouth Skin reactions on sun-exposed areas Side effects that usually do not require medical attention (report to your care team if they continue or are bothersome): Change in nail shape, thickness, or color Diarrhea Dry skin Fatigue Nausea Vomiting This list may not describe all possible side effects. Call your doctor for medical advice about side effects. You may report side effects to FDA at 1-800-FDA-1088. Where should I keep my  medication? This medication is given in a hospital or clinic. It will not be stored at home. NOTE: This sheet is a summary. It may not cover all possible information. If you have questions about this medicine, talk to your doctor, pharmacist, or health care provider.  2023 Elsevier/Gold Standard (2022-02-27 00:00:00)        To help prevent nausea and vomiting after your treatment, we encourage you to take your nausea medication as directed.  BELOW ARE SYMPTOMS THAT SHOULD BE REPORTED IMMEDIATELY: *FEVER GREATER THAN 100.4 F (38 C) OR HIGHER *CHILLS OR SWEATING *NAUSEA AND VOMITING THAT IS NOT CONTROLLED WITH YOUR NAUSEA MEDICATION *UNUSUAL SHORTNESS OF BREATH *UNUSUAL BRUISING OR BLEEDING *URINARY PROBLEMS (pain or burning when urinating, or frequent urination) *BOWEL PROBLEMS (unusual diarrhea, constipation, pain near the anus) TENDERNESS IN MOUTH AND THROAT WITH OR WITHOUT PRESENCE OF ULCERS (sore throat, sores in mouth, or a toothache) UNUSUAL RASH, SWELLING OR PAIN  UNUSUAL VAGINAL DISCHARGE OR ITCHING   Items with * indicate a potential emergency and should be followed up as soon as possible or go to the Emergency Department if any problems should occur.  Please show the CHEMOTHERAPY ALERT CARD or IMMUNOTHERAPY ALERT CARD at check-in to the Emergency Department and triage nurse.  Should   you have questions after your visit or need to cancel or reschedule your appointment, please contact [facility] [phone]  and follow the prompts.  Office hours are 8:00 a.m. to 4:30 p.m. Monday - Friday. Please note that voicemails left after 4:00 p.m. may not be returned until the following business day.  We are closed weekends and major holidays. You have access to a nurse at all times for urgent questions. Please call the main number to the clinic [phone] and follow the prompts.  For any non-urgent questions, you may also contact your provider using MyChart. We now  offer e-Visits for anyone 18 and older to request care online for non-urgent symptoms. For details visit [email or web address].   Also download the MyChart app! Go to the app store, search "MyChart", open the app, select [facility], and log in with your MyChart username and password.   

## 2023-03-21 NOTE — Progress Notes (Signed)
Patient presents today for chemotherapy infusion of Vectibix. Patient is in satisfactory condition with no new complaints voiced. Vital signs are stable.  Labs reviewed by Dr. Ellin Saba during the office visit and all labs are within treatment parameters. Patient's potassium was 3.3 and magnesium 1.6, patient to get 40 mEq potassium PO and Magnesium 2g IV today and we will proceed with treatment per MD orders.   Patient tolerated treatment well with no complaints voiced.  Patient left ambulatory in stable condition.  Vital signs stable at discharge.  Follow up as scheduled.

## 2023-03-21 NOTE — Patient Instructions (Addendum)
[facility] at [facility] Discharge Instructions   You were seen and examined today by Dr. Ellin Saba.  He reviewed the results of your lab work which are normal/stable.   We will proceed with your treatment today.   You may use the gel and the cream both twice a day as needed for rash.   Increase Braftovi to 4 pills a day. We sent a new prescription to the specialty pharmacy. They will deliver this to your home.   Return as scheduled.    Thank you for choosing [facility] at [facility] to provide your oncology and hematology care.  To afford each patient quality time with our provider, please arrive at least 15 minutes before your scheduled appointment time.   If you have a lab appointment with the Cancer Center please come in thru the Main Entrance and check in at the main information desk.  You need to re-schedule your appointment should you arrive 10 or more minutes late.  We strive to give you quality time with our providers, and arriving late affects you and other patients whose appointments are after yours.  Also, if you no show three or more times for appointments you may be dismissed from the clinic at the providers discretion.     Again, thank you for choosing [facility].  Our hope is that these requests will decrease the amount of time that you wait before being seen by our physicians.       _____________________________________________________________  Should you have questions after your visit to [facility], please contact our office at [phone] and follow the prompts.  Our office hours are 8:00 a.m. and 4:30 p.m. Monday - Friday.  Please note that voicemails left after 4:00 p.m. may not be returned until the following business day.  We are closed weekends and major holidays.  You do have access to a nurse 24-7, just call the main number to the clinic [phone] and do not press any options, hold  on the line and a nurse will answer the phone.    For prescription refill requests, have your pharmacy contact our office and allow 72 hours.    Due to Covid, you will need to wear a mask upon entering the hospital. If you do not have a mask, a mask will be given to you at the Main Entrance upon arrival. For doctor visits, patients may have 1 support person age 68 or older with them. For treatment visits, patients can not have anyone with them due to social distancing guidelines and our immunocompromised population.

## 2023-03-21 NOTE — Progress Notes (Signed)
Patient is taking Braftovi as prescribed.  He has not missed any doses and reports no side effects at this time.    Patient has been examined by Dr. Ellin Saba. Vital signs and labs have been reviewed by MD - ANC, Creatinine, LFTs, hemoglobin, and platelets are within treatment parameters per M.D. - pt may proceed with treatment.  Primary RN and pharmacy notified.

## 2023-03-21 NOTE — Progress Notes (Signed)
Maintain dose today at 700 mg (6 mg/kg) despite slight weight loss.  T.O. Dr Katragadda/Lulia Schriner, PharmD 

## 2023-03-22 ENCOUNTER — Other Ambulatory Visit: Payer: Self-pay | Admitting: Nurse Practitioner

## 2023-03-22 DIAGNOSIS — J310 Chronic rhinitis: Secondary | ICD-10-CM

## 2023-03-23 ENCOUNTER — Other Ambulatory Visit: Payer: Self-pay

## 2023-03-27 ENCOUNTER — Other Ambulatory Visit (HOSPITAL_COMMUNITY): Payer: Self-pay

## 2023-03-27 ENCOUNTER — Encounter: Payer: Self-pay | Admitting: Hematology

## 2023-03-28 ENCOUNTER — Other Ambulatory Visit (HOSPITAL_COMMUNITY): Payer: Self-pay

## 2023-03-28 ENCOUNTER — Other Ambulatory Visit: Payer: Self-pay

## 2023-03-29 ENCOUNTER — Other Ambulatory Visit (HOSPITAL_COMMUNITY): Payer: Self-pay

## 2023-03-29 ENCOUNTER — Other Ambulatory Visit: Payer: Self-pay

## 2023-04-04 ENCOUNTER — Other Ambulatory Visit: Payer: Self-pay

## 2023-04-06 ENCOUNTER — Inpatient Hospital Stay: Payer: Medicare Other | Attending: Hematology

## 2023-04-06 ENCOUNTER — Inpatient Hospital Stay: Payer: Medicare Other

## 2023-04-06 VITALS — BP 113/78 | HR 98 | Temp 97.7°F | Resp 19

## 2023-04-06 DIAGNOSIS — Z880 Allergy status to penicillin: Secondary | ICD-10-CM | POA: Diagnosis not present

## 2023-04-06 DIAGNOSIS — Z79899 Other long term (current) drug therapy: Secondary | ICD-10-CM | POA: Insufficient documentation

## 2023-04-06 DIAGNOSIS — E785 Hyperlipidemia, unspecified: Secondary | ICD-10-CM | POA: Diagnosis not present

## 2023-04-06 DIAGNOSIS — Z87891 Personal history of nicotine dependence: Secondary | ICD-10-CM | POA: Insufficient documentation

## 2023-04-06 DIAGNOSIS — E876 Hypokalemia: Secondary | ICD-10-CM

## 2023-04-06 DIAGNOSIS — Z888 Allergy status to other drugs, medicaments and biological substances status: Secondary | ICD-10-CM | POA: Insufficient documentation

## 2023-04-06 DIAGNOSIS — R21 Rash and other nonspecific skin eruption: Secondary | ICD-10-CM | POA: Insufficient documentation

## 2023-04-06 DIAGNOSIS — R197 Diarrhea, unspecified: Secondary | ICD-10-CM | POA: Diagnosis not present

## 2023-04-06 DIAGNOSIS — Z1501 Genetic susceptibility to malignant neoplasm of breast: Secondary | ICD-10-CM | POA: Insufficient documentation

## 2023-04-06 DIAGNOSIS — I10 Essential (primary) hypertension: Secondary | ICD-10-CM | POA: Insufficient documentation

## 2023-04-06 DIAGNOSIS — Z9049 Acquired absence of other specified parts of digestive tract: Secondary | ICD-10-CM | POA: Insufficient documentation

## 2023-04-06 DIAGNOSIS — Z8261 Family history of arthritis: Secondary | ICD-10-CM | POA: Insufficient documentation

## 2023-04-06 DIAGNOSIS — Z832 Family history of diseases of the blood and blood-forming organs and certain disorders involving the immune mechanism: Secondary | ICD-10-CM | POA: Diagnosis not present

## 2023-04-06 DIAGNOSIS — Z1509 Genetic susceptibility to other malignant neoplasm: Secondary | ICD-10-CM | POA: Diagnosis not present

## 2023-04-06 DIAGNOSIS — C182 Malignant neoplasm of ascending colon: Secondary | ICD-10-CM

## 2023-04-06 DIAGNOSIS — Z8269 Family history of other diseases of the musculoskeletal system and connective tissue: Secondary | ICD-10-CM | POA: Insufficient documentation

## 2023-04-06 DIAGNOSIS — Z792 Long term (current) use of antibiotics: Secondary | ICD-10-CM | POA: Diagnosis not present

## 2023-04-06 DIAGNOSIS — Z95828 Presence of other vascular implants and grafts: Secondary | ICD-10-CM

## 2023-04-06 DIAGNOSIS — Z8249 Family history of ischemic heart disease and other diseases of the circulatory system: Secondary | ICD-10-CM | POA: Diagnosis not present

## 2023-04-06 DIAGNOSIS — Z882 Allergy status to sulfonamides status: Secondary | ICD-10-CM | POA: Insufficient documentation

## 2023-04-06 DIAGNOSIS — Z5112 Encounter for antineoplastic immunotherapy: Secondary | ICD-10-CM | POA: Diagnosis present

## 2023-04-06 DIAGNOSIS — R918 Other nonspecific abnormal finding of lung field: Secondary | ICD-10-CM | POA: Insufficient documentation

## 2023-04-06 LAB — COMPREHENSIVE METABOLIC PANEL
ALT: 17 U/L (ref 0–44)
AST: 16 U/L (ref 15–41)
Albumin: 3.6 g/dL (ref 3.5–5.0)
Alkaline Phosphatase: 84 U/L (ref 38–126)
Anion gap: 11 (ref 5–15)
BUN: 16 mg/dL (ref 8–23)
CO2: 23 mmol/L (ref 22–32)
Calcium: 8.7 mg/dL — ABNORMAL LOW (ref 8.9–10.3)
Chloride: 103 mmol/L (ref 98–111)
Creatinine, Ser: 0.94 mg/dL (ref 0.61–1.24)
GFR, Estimated: >60 mL/min (ref >60)
Glucose, Bld: 121 mg/dL — ABNORMAL HIGH (ref 70–99)
Potassium: 3.4 mmol/L — ABNORMAL LOW (ref 3.5–5.1)
Sodium: 137 mmol/L (ref 135–145)
Total Bilirubin: 0.7 mg/dL (ref 0.3–1.2)
Total Protein: 6.7 g/dL (ref 6.5–8.1)

## 2023-04-06 LAB — CBC WITH DIFFERENTIAL/PLATELET
Abs Immature Granulocytes: 0.07 10*3/uL (ref 0.00–0.07)
Basophils Absolute: 0.1 10*3/uL (ref 0.0–0.1)
Basophils Relative: 1 %
Eosinophils Absolute: 0.6 10*3/uL — ABNORMAL HIGH (ref 0.0–0.5)
Eosinophils Relative: 10 %
HCT: 48.1 % (ref 39.0–52.0)
Hemoglobin: 16.2 g/dL (ref 13.0–17.0)
Immature Granulocytes: 1 %
Lymphocytes Relative: 23 %
Lymphs Abs: 1.3 10*3/uL (ref 0.7–4.0)
MCH: 32 pg (ref 26.0–34.0)
MCHC: 33.7 g/dL (ref 30.0–36.0)
MCV: 95.1 fL (ref 80.0–100.0)
Monocytes Absolute: 0.5 10*3/uL (ref 0.1–1.0)
Monocytes Relative: 8 %
Neutro Abs: 3.2 10*3/uL (ref 1.7–7.7)
Neutrophils Relative %: 57 %
Platelets: 202 10*3/uL (ref 150–400)
RBC: 5.06 MIL/uL (ref 4.22–5.81)
RDW: 13.2 % (ref 11.5–15.5)
WBC: 5.6 10*3/uL (ref 4.0–10.5)
nRBC: 0 % (ref 0.0–0.2)

## 2023-04-06 LAB — MAGNESIUM: Magnesium: 1.3 mg/dL — ABNORMAL LOW (ref 1.7–2.4)

## 2023-04-06 MED ORDER — HEPARIN SOD (PORK) LOCK FLUSH 100 UNIT/ML IV SOLN
500.0000 [IU] | Freq: Once | INTRAVENOUS | Status: AC | PRN
  Administered 2023-04-06: 500 [IU]

## 2023-04-06 MED ORDER — MAGNESIUM SULFATE 2 GM/50ML IV SOLN
2.0000 g | Freq: Once | INTRAVENOUS | Status: AC
  Administered 2023-04-06: 2 g via INTRAVENOUS

## 2023-04-06 MED ORDER — SODIUM CHLORIDE 0.9 % IV SOLN: Freq: Once | INTRAVENOUS | Status: AC

## 2023-04-06 MED ORDER — MAGNESIUM SULFATE 2 GM/50ML IV SOLN
2.0000 g | Freq: Once | INTRAVENOUS | Status: AC
  Administered 2023-04-06: 2 g via INTRAVENOUS
  Filled 2023-04-06: qty 50

## 2023-04-06 MED ORDER — MAGNESIUM SULFATE 2 GM/50ML IV SOLN
2.0000 g | Freq: Once | INTRAVENOUS | Status: DC
  Filled 2023-04-06: qty 50

## 2023-04-06 MED ORDER — SODIUM CHLORIDE 0.9% FLUSH
10.0000 mL | INTRAVENOUS | Status: DC | PRN
  Administered 2023-04-06: 10 mL via INTRAVENOUS

## 2023-04-06 MED ORDER — POTASSIUM CHLORIDE CRYS ER 20 MEQ PO TBCR
40.0000 meq | EXTENDED_RELEASE_TABLET | Freq: Once | ORAL | Status: AC
  Administered 2023-04-06: 40 meq via ORAL
  Filled 2023-04-06: qty 2

## 2023-04-06 MED ORDER — SODIUM CHLORIDE 0.9% FLUSH
10.0000 mL | INTRAVENOUS | Status: DC | PRN
  Administered 2023-04-06: 10 mL

## 2023-04-06 MED ORDER — SODIUM CHLORIDE 0.9 % IV SOLN
600.0000 mg | Freq: Once | INTRAVENOUS | Status: AC
  Administered 2023-04-06: 600 mg via INTRAVENOUS
  Filled 2023-04-06: qty 20

## 2023-04-06 NOTE — Patient Instructions (Signed)
You had your port flushed for treatment today and labs drawn.

## 2023-04-06 NOTE — Patient Instructions (Signed)
[facility]  Discharge Instructions: Thank you for choosing [facility] to provide your oncology and hematology care.  If you have a lab appointment with the Cancer Center - please note that after April 8th, 2024, all labs will be drawn in the cancer center.  You do not have to check in or register with the main entrance as you have in the past but will complete your check-in in the cancer center.  Wear comfortable clothing and clothing appropriate for easy access to any Portacath or PICC line.   We strive to give you quality time with your provider. You may need to reschedule your appointment if you arrive late (15 or more minutes).  Arriving late affects you and other patients whose appointments are after yours.  Also, if you miss three or more appointments without notifying the office, you may be dismissed from the clinic at the provider's discretion.      For prescription refill requests, have your pharmacy contact our office and allow 72 hours for refills to be completed.    Today you received the following chemotherapy and/or immunotherapy agents Vectibix.  Panitumumab Injection What is this medication? PANITUMUMAB (pan i TOOM ue mab) treats colorectal cancer. It works by blocking a protein that causes cancer cells to grow and multiply. This helps to slow or stop the spread of cancer cells. It is a monoclonal antibody. This medicine may be used for other purposes; ask your health care provider or pharmacist if you have questions. COMMON BRAND NAME(S): Vectibix What should I tell my care team before I take this medication? They need to know if you have any of these conditions: Eye disease Low levels of magnesium in the blood Lung disease An unusual or allergic reaction to panitumumab, other medications, foods, dyes, or preservatives Pregnant or trying to get pregnant Breast-feeding How should I use this medication? This medication is injected into a  vein. It is given by your care team in a hospital or clinic setting. Talk to your care team about the use of this medication in children. Special care may be needed. Overdosage: If you think you have taken too much of this medicine contact a poison control center or emergency room at once. NOTE: This medicine is only for you. Do not share this medicine with others. What if I miss a dose? Keep appointments for follow-up doses. It is important not to miss your dose. Call your care team if you are unable to keep an appointment. What may interact with this medication? Bevacizumab This list may not describe all possible interactions. Give your health care provider a list of all the medicines, herbs, non-prescription drugs, or dietary supplements you use. Also tell them if you smoke, drink alcohol, or use illegal drugs. Some items may interact with your medicine. What should I watch for while using this medication? Your condition will be monitored carefully while you are receiving this medication. This medication may make you feel generally unwell. This is not uncommon as chemotherapy can affect healthy cells as well as cancer cells. Report any side effects. Continue your course of treatment even though you feel ill unless your care team tells you to stop. This medication can make you more sensitive to the sun. Keep out of the sun while receiving this medication and for 2 months after stopping therapy. If you cannot avoid being in the sun, wear protective clothing and sunscreen. Do not use sun lamps, tanning beds, or tanning booths.   Check with your care team if you have severe diarrhea, nausea, and vomiting or if you sweat a lot. The loss of too much body fluid may make it dangerous for you to take this medication. This medication may cause serious skin reactions. They can happen weeks to months after starting the medication. Contact your care team right away if you notice fevers or flu-like symptoms with a  rash. The rash may be red or purple and then turn into blisters or peeling of the skin. You may also notice a red rash with swelling of the face, lips, or lymph nodes in your neck or under your arms. Talk to your care team if you may be pregnant. Serious birth defects can occur if you take this medication during pregnancy and for 2 months after the last dose. Contraception is recommended while taking this medication and for 2 months after the last dose. Your care team can help you find the option that works for you. Do not breastfeed while taking this medication and for 2 months after the last dose. This medication may cause infertility. Talk to your care team if you are concerned about your fertility. What side effects may I notice from receiving this medication? Side effects that you should report to your care team as soon as possible: Allergic reactions--skin rash, itching, hives, swelling of the face, lips, tongue, or throat Dry cough, shortness of breath or trouble breathing Eye pain, redness, irritation, or discharge with blurry or decreased vision Infusion reactions--chest pain, shortness of breath or trouble breathing, feeling faint or lightheaded Low magnesium level--muscle pain or cramps, unusual weakness or fatigue, fast or irregular heartbeat, tremors Low potassium level--muscle pain or cramps, unusual weakness or fatigue, fast or irregular heartbeat, constipation Redness, blistering, peeling, or loosening of the skin, including inside the mouth Skin reactions on sun-exposed areas Side effects that usually do not require medical attention (report to your care team if they continue or are bothersome): Change in nail shape, thickness, or color Diarrhea Dry skin Fatigue Nausea Vomiting This list may not describe all possible side effects. Call your doctor for medical advice about side effects. You may report side effects to FDA at 1-800-FDA-1088. Where should I keep my  medication? This medication is given in a hospital or clinic. It will not be stored at home. NOTE: This sheet is a summary. It may not cover all possible information. If you have questions about this medicine, talk to your doctor, pharmacist, or health care provider.  2024 Elsevier/Gold Standard (2022-03-01 00:00:00)        To help prevent nausea and vomiting after your treatment, we encourage you to take your nausea medication as directed.  BELOW ARE SYMPTOMS THAT SHOULD BE REPORTED IMMEDIATELY: *FEVER GREATER THAN 100.4 F (38 C) OR HIGHER *CHILLS OR SWEATING *NAUSEA AND VOMITING THAT IS NOT CONTROLLED WITH YOUR NAUSEA MEDICATION *UNUSUAL SHORTNESS OF BREATH *UNUSUAL BRUISING OR BLEEDING *URINARY PROBLEMS (pain or burning when urinating, or frequent urination) *BOWEL PROBLEMS (unusual diarrhea, constipation, pain near the anus) TENDERNESS IN MOUTH AND THROAT WITH OR WITHOUT PRESENCE OF ULCERS (sore throat, sores in mouth, or a toothache) UNUSUAL RASH, SWELLING OR PAIN  UNUSUAL VAGINAL DISCHARGE OR ITCHING   Items with * indicate a potential emergency and should be followed up as soon as possible or go to the Emergency Department if any problems should occur.  Please show the CHEMOTHERAPY ALERT CARD or IMMUNOTHERAPY ALERT CARD at check-in to the Emergency Department and triage nurse.  Should  you have questions after your visit or need to cancel or reschedule your appointment, please contact [facility] [phone]  and follow the prompts.  Office hours are 8:00 a.m. to 4:30 p.m. Monday - Friday. Please note that voicemails left after 4:00 p.m. may not be returned until the following business day.  We are closed weekends and major holidays. You have access to a nurse at all times for urgent questions. Please call the main number to the clinic [phone] and follow the prompts.  For any non-urgent questions, you may also contact your provider using MyChart. We now  offer e-Visits for anyone 70 and older to request care online for non-urgent symptoms. For details visit [email or web address].   Also download the MyChart app! Go to the app store, search "MyChart", open the app, select [facility], and log in with your MyChart username and password.

## 2023-04-06 NOTE — Progress Notes (Signed)
Patient presents today for chemotherapy infusion.  Patient is in satisfactory condition with no new complaints voiced.  Vital signs are stable.  Labs reviewed and all labs are within treatment parameters.  Magnesium today is 1.3 and potassium is 3.4.  We will give Klor Con 40 mEq PO x one dose and magnesium 4 grams IV x one dose today per standing orders by Dr. Ellin Saba.  We will proceed with treatment per MD orders.    Patient tolerated treatment and infusion well with no complaints voiced.  Patient left ambulatory in stable condition.  Vital signs stable at discharge.  Follow up as scheduled.

## 2023-04-06 NOTE — Progress Notes (Signed)
VO to adjust vectibix to 600mg  with pt weight loss, new weight 104 kg

## 2023-04-06 NOTE — Progress Notes (Signed)
Port flushed and labs drawn for treatment today.

## 2023-04-17 ENCOUNTER — Other Ambulatory Visit (HOSPITAL_COMMUNITY): Payer: Self-pay

## 2023-04-17 ENCOUNTER — Other Ambulatory Visit: Payer: Self-pay | Admitting: Hematology

## 2023-04-17 ENCOUNTER — Other Ambulatory Visit: Payer: Self-pay

## 2023-04-17 DIAGNOSIS — C182 Malignant neoplasm of ascending colon: Secondary | ICD-10-CM

## 2023-04-17 MED ORDER — BRAFTOVI 75 MG PO CAPS
300.0000 mg | ORAL_CAPSULE | Freq: Every day | ORAL | 0 refills | Status: DC
Start: 2023-04-17 — End: 2023-05-15
  Filled 2023-04-17: qty 120, 30d supply, fill #0

## 2023-04-18 ENCOUNTER — Other Ambulatory Visit (HOSPITAL_COMMUNITY): Payer: Self-pay

## 2023-04-19 ENCOUNTER — Other Ambulatory Visit (HOSPITAL_COMMUNITY): Payer: Self-pay

## 2023-04-19 ENCOUNTER — Inpatient Hospital Stay (HOSPITAL_BASED_OUTPATIENT_CLINIC_OR_DEPARTMENT_OTHER): Payer: Medicare Other | Admitting: Hematology

## 2023-04-19 ENCOUNTER — Other Ambulatory Visit: Payer: Self-pay

## 2023-04-19 ENCOUNTER — Inpatient Hospital Stay: Payer: Medicare Other

## 2023-04-19 VITALS — BP 130/81 | HR 79 | Temp 97.6°F | Resp 18

## 2023-04-19 DIAGNOSIS — C182 Malignant neoplasm of ascending colon: Secondary | ICD-10-CM

## 2023-04-19 DIAGNOSIS — Z5112 Encounter for antineoplastic immunotherapy: Secondary | ICD-10-CM | POA: Diagnosis not present

## 2023-04-19 LAB — CBC WITH DIFFERENTIAL/PLATELET
Abs Immature Granulocytes: 0.05 10*3/uL (ref 0.00–0.07)
Basophils Absolute: 0.1 10*3/uL (ref 0.0–0.1)
Basophils Relative: 1 %
Eosinophils Absolute: 0.5 10*3/uL (ref 0.0–0.5)
Eosinophils Relative: 8 %
HCT: 47.5 % (ref 39.0–52.0)
Hemoglobin: 15.8 g/dL (ref 13.0–17.0)
Immature Granulocytes: 1 %
Lymphocytes Relative: 24 %
Lymphs Abs: 1.3 10*3/uL (ref 0.7–4.0)
MCH: 31.9 pg (ref 26.0–34.0)
MCHC: 33.3 g/dL (ref 30.0–36.0)
MCV: 96 fL (ref 80.0–100.0)
Monocytes Absolute: 0.6 10*3/uL (ref 0.1–1.0)
Monocytes Relative: 10 %
Neutro Abs: 3.1 10*3/uL (ref 1.7–7.7)
Neutrophils Relative %: 56 %
Platelets: 190 10*3/uL (ref 150–400)
RBC: 4.95 MIL/uL (ref 4.22–5.81)
RDW: 13.3 % (ref 11.5–15.5)
WBC: 5.5 10*3/uL (ref 4.0–10.5)
nRBC: 0 % (ref 0.0–0.2)

## 2023-04-19 LAB — COMPREHENSIVE METABOLIC PANEL
ALT: 17 U/L (ref 0–44)
AST: 17 U/L (ref 15–41)
Albumin: 3.4 g/dL — ABNORMAL LOW (ref 3.5–5.0)
Alkaline Phosphatase: 88 U/L (ref 38–126)
Anion gap: 8 (ref 5–15)
BUN: 14 mg/dL (ref 8–23)
CO2: 24 mmol/L (ref 22–32)
Calcium: 8.7 mg/dL — ABNORMAL LOW (ref 8.9–10.3)
Chloride: 106 mmol/L (ref 98–111)
Creatinine, Ser: 0.84 mg/dL (ref 0.61–1.24)
GFR, Estimated: >60 mL/min (ref >60)
Glucose, Bld: 116 mg/dL — ABNORMAL HIGH (ref 70–99)
Potassium: 4.7 mmol/L (ref 3.5–5.1)
Sodium: 138 mmol/L (ref 135–145)
Total Bilirubin: 0.6 mg/dL (ref 0.3–1.2)
Total Protein: 6.6 g/dL (ref 6.5–8.1)

## 2023-04-19 LAB — MAGNESIUM: Magnesium: 1.5 mg/dL — ABNORMAL LOW (ref 1.7–2.4)

## 2023-04-19 MED ORDER — MAGNESIUM OXIDE -MG SUPPLEMENT 400 (240 MG) MG PO TABS: 400.0000 mg | ORAL_TABLET | Freq: Three times a day (TID) | ORAL | 6 refills | Status: DC

## 2023-04-19 MED ORDER — HEPARIN SOD (PORK) LOCK FLUSH 100 UNIT/ML IV SOLN
500.0000 [IU] | Freq: Once | INTRAVENOUS | Status: AC | PRN
  Administered 2023-04-19: 500 [IU]

## 2023-04-19 MED ORDER — SODIUM CHLORIDE 0.9% FLUSH
10.0000 mL | INTRAVENOUS | Status: DC | PRN
  Administered 2023-04-19: 10 mL

## 2023-04-19 MED ORDER — SODIUM CHLORIDE 0.9% FLUSH
10.0000 mL | Freq: Once | INTRAVENOUS | Status: AC
  Administered 2023-04-19: 10 mL via INTRAVENOUS

## 2023-04-19 MED ORDER — SODIUM CHLORIDE 0.9 % IV SOLN
6.0000 mg/kg | Freq: Once | INTRAVENOUS | Status: AC
  Administered 2023-04-19: 600 mg via INTRAVENOUS
  Filled 2023-04-19: qty 20

## 2023-04-19 MED ORDER — SODIUM CHLORIDE 0.9 % IV SOLN: Freq: Once | INTRAVENOUS | Status: AC

## 2023-04-19 MED ORDER — MAGNESIUM SULFATE 2 GM/50ML IV SOLN
2.0000 g | Freq: Once | INTRAVENOUS | Status: AC
  Administered 2023-04-19: 2 g via INTRAVENOUS
  Filled 2023-04-19: qty 50
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
## 2023-04-19 NOTE — Progress Notes (Signed)
Patient presents today for Vectibix infusion. Patient is in satisfactory condition with no new complaints voiced.  Vital signs are stable.  Labs reviewed by Dr. Ellin Saba during the office visit and all labs are within treatment parameters. Pt's magnesium noted to be 1.5 today, pt will receive 2g IV magnesium sulfate per Dr.K's standing orders. We will proceed with treatment per MD orders.   Treatment given today per MD orders. Tolerated infusion without adverse affects. Vital signs stable. No complaints at this time. Discharged from clinic ambulatory in stable condition. Alert and oriented x 3. F/U with [facility] as scheduled.

## 2023-04-19 NOTE — Patient Instructions (Signed)
[facility]  Discharge Instructions: Thank you for choosing [facility] to provide your oncology and hematology care.  If you have a lab appointment with the Cancer Center - please note that after April 8th, 2024, all labs will be drawn in the cancer center.  You do not have to check in or register with the main entrance as you have in the past but will complete your check-in in the cancer center.  Wear comfortable clothing and clothing appropriate for easy access to any Portacath or PICC line.   We strive to give you quality time with your provider. You may need to reschedule your appointment if you arrive late (15 or more minutes).  Arriving late affects you and other patients whose appointments are after yours.  Also, if you miss three or more appointments without notifying the office, you may be dismissed from the clinic at the provider's discretion.      For prescription refill requests, have your pharmacy contact our office and allow 72 hours for refills to be completed.    Today you received the following chemotherapy and/or immunotherapy agents Vectibix   To help prevent nausea and vomiting after your treatment, we encourage you to take your nausea medication as directed.  Panitumumab Injection What is this medication? PANITUMUMAB (pan i TOOM ue mab) treats colorectal cancer. It works by blocking a protein that causes cancer cells to grow and multiply. This helps to slow or stop the spread of cancer cells. It is a monoclonal antibody. This medicine may be used for other purposes; ask your health care provider or pharmacist if you have questions. COMMON BRAND NAME(S): Vectibix What should I tell my care team before I take this medication? They need to know if you have any of these conditions: Eye disease Low levels of magnesium in the blood Lung disease An unusual or allergic reaction to panitumumab, other medications, foods, dyes, or  preservatives Pregnant or trying to get pregnant Breast-feeding How should I use this medication? This medication is injected into a vein. It is given by your care team in a hospital or clinic setting. Talk to your care team about the use of this medication in children. Special care may be needed. Overdosage: If you think you have taken too much of this medicine contact a poison control center or emergency room at once. NOTE: This medicine is only for you. Do not share this medicine with others. What if I miss a dose? Keep appointments for follow-up doses. It is important not to miss your dose. Call your care team if you are unable to keep an appointment. What may interact with this medication? Bevacizumab This list may not describe all possible interactions. Give your health care provider a list of all the medicines, herbs, non-prescription drugs, or dietary supplements you use. Also tell them if you smoke, drink alcohol, or use illegal drugs. Some items may interact with your medicine. What should I watch for while using this medication? Your condition will be monitored carefully while you are receiving this medication. This medication may make you feel generally unwell. This is not uncommon as chemotherapy can affect healthy cells as well as cancer cells. Report any side effects. Continue your course of treatment even though you feel ill unless your care team tells you to stop. This medication can make you more sensitive to the sun. Keep out of the sun while receiving this medication and for 2 months after stopping therapy. If you  cannot avoid being in the sun, wear protective clothing and sunscreen. Do not use sun lamps, tanning beds, or tanning booths. Check with your care team if you have severe diarrhea, nausea, and vomiting or if you sweat a lot. The loss of too much body fluid may make it dangerous for you to take this medication. This medication may cause serious skin reactions. They can  happen weeks to months after starting the medication. Contact your care team right away if you notice fevers or flu-like symptoms with a rash. The rash may be red or purple and then turn into blisters or peeling of the skin. You may also notice a red rash with swelling of the face, lips, or lymph nodes in your neck or under your arms. Talk to your care team if you may be pregnant. Serious birth defects can occur if you take this medication during pregnancy and for 2 months after the last dose. Contraception is recommended while taking this medication and for 2 months after the last dose. Your care team can help you find the option that works for you. Do not breastfeed while taking this medication and for 2 months after the last dose. This medication may cause infertility. Talk to your care team if you are concerned about your fertility. What side effects may I notice from receiving this medication? Side effects that you should report to your care team as soon as possible: Allergic reactions--skin rash, itching, hives, swelling of the face, lips, tongue, or throat Dry cough, shortness of breath or trouble breathing Eye pain, redness, irritation, or discharge with blurry or decreased vision Infusion reactions--chest pain, shortness of breath or trouble breathing, feeling faint or lightheaded Low magnesium level--muscle pain or cramps, unusual weakness or fatigue, fast or irregular heartbeat, tremors Low potassium level--muscle pain or cramps, unusual weakness or fatigue, fast or irregular heartbeat, constipation Redness, blistering, peeling, or loosening of the skin, including inside the mouth Skin reactions on sun-exposed areas Side effects that usually do not require medical attention (report to your care team if they continue or are bothersome): Change in nail shape, thickness, or color Diarrhea Dry skin Fatigue Nausea Vomiting This list may not describe all possible side effects. Call your  doctor for medical advice about side effects. You may report side effects to FDA at 1-800-FDA-1088. Where should I keep my medication? This medication is given in a hospital or clinic. It will not be stored at home. NOTE: This sheet is a summary. It may not cover all possible information. If you have questions about this medicine, talk to your doctor, pharmacist, or health care provider.  2024 Elsevier/Gold Standard (2022-03-01 00:00:00)   BELOW ARE SYMPTOMS THAT SHOULD BE REPORTED IMMEDIATELY: *FEVER GREATER THAN 100.4 F (38 C) OR HIGHER *CHILLS OR SWEATING *NAUSEA AND VOMITING THAT IS NOT CONTROLLED WITH YOUR NAUSEA MEDICATION *UNUSUAL SHORTNESS OF BREATH *UNUSUAL BRUISING OR BLEEDING *URINARY PROBLEMS (pain or burning when urinating, or frequent urination) *BOWEL PROBLEMS (unusual diarrhea, constipation, pain near the anus) TENDERNESS IN MOUTH AND THROAT WITH OR WITHOUT PRESENCE OF ULCERS (sore throat, sores in mouth, or a toothache) UNUSUAL RASH, SWELLING OR PAIN  UNUSUAL VAGINAL DISCHARGE OR ITCHING   Items with * indicate a potential emergency and should be followed up as soon as possible or go to the Emergency Department if any problems should occur.  Please show the CHEMOTHERAPY ALERT CARD or IMMUNOTHERAPY ALERT CARD at check-in to the Emergency Department and triage nurse.  Should you have questions after  your visit or need to cancel or reschedule your appointment, please contact [facility]ENTER AT [facility] [phone]  and follow the prompts.  Office hours are 8:00 a.m. to 4:30 p.m. Monday - Friday. Please note that voicemails left after 4:00 p.m. may not be returned until the following business day.  We are closed weekends and major holidays. You have access to a nurse at all times for urgent questions. Please call the main number to the clinic [phone] and follow the prompts.  For any non-urgent questions, you may also contact your provider using MyChart. We now  offer e-Visits for anyone 19 and older to request care online for non-urgent symptoms. For details visit [email or web address].   Also download the MyChart app! Go to the app store, search "MyChart", open the app, select [place], and log in with your MyChart username and password.

## 2023-04-19 NOTE — Patient Instructions (Addendum)
[facility] Discharge Instructions   You were seen and examined today by Dr. Ellin Saba.  He reviewed the results of your lab work which are mostly normal/stable. Your magnesium is low. We will give you IV magnesium in the clinic today. Increase your magnesium to three times a day.   We will proceed with your treatment today.   We will repeat a PET scan prior to your next visit with Dr. Kirtland Bouchard.   Return as scheduled.    Thank you for choosing [facility] at [facility] to provide your oncology and hematology care.  To afford each patient quality time with our provider, please arrive at least 15 minutes before your scheduled appointment time.   If you have a lab appointment with the Cancer Center please come in thru the Main Entrance and check in at the main information desk.  You need to re-schedule your appointment should you arrive 10 or more minutes late.  We strive to give you quality time with our providers, and arriving late affects you and other patients whose appointments are after yours.  Also, if you no show three or more times for appointments you may be dismissed from the clinic at the providers discretion.     Again, thank you for choosing [facility].  Our hope is that these requests will decrease the amount of time that you wait before being seen by our physicians.       _____________________________________________________________  Should you have questions after your visit to [facility], please contact our office at [phone] and follow the prompts.  Our office hours are 8:00 a.m. and 4:30 p.m. Monday - Friday.  Please note that voicemails left after 4:00 p.m. may not be returned until the following business day.  We are closed weekends and major holidays.  You do have access to a nurse 24-7, just call the main number to the clinic [phone] and do not press any options, hold on the line  and a nurse will answer the phone.    For prescription refill requests, have your pharmacy contact our office and allow 72 hours.    Due to Covid, you will need to wear a mask upon entering the hospital. If you do not have a mask, a mask will be given to you at the Main Entrance upon arrival. For doctor visits, patients may have 1 support person age 25 or older with them. For treatment visits, patients can not have anyone with them due to social distancing guidelines and our immunocompromised population.

## 2023-04-19 NOTE — Progress Notes (Signed)
Patients port flushed without difficulty.  Good blood return noted with no bruising or swelling noted at site.  Stable during access and blood draw.  Patient to remain accessed for treatment. 

## 2023-04-19 NOTE — Progress Notes (Signed)
Patient is taking Braftovi as prescribed. He has not missed any doses and reports no side effects at this time.    Patient has been examined by Dr. Ellin Saba. Vital signs and labs have been reviewed by MD - ANC, Creatinine, LFTs, hemoglobin, and platelets are within treatment parameters per M.D. - pt may proceed with treatment. Administer magnesium IV per standing orders per MD. Primary RN and pharmacy notified.

## 2023-04-20 ENCOUNTER — Other Ambulatory Visit: Payer: Self-pay

## 2023-04-23 ENCOUNTER — Other Ambulatory Visit (HOSPITAL_COMMUNITY): Payer: Self-pay

## 2023-04-23 ENCOUNTER — Other Ambulatory Visit: Payer: Self-pay

## 2023-04-24 ENCOUNTER — Other Ambulatory Visit: Payer: Self-pay

## 2023-04-26 ENCOUNTER — Other Ambulatory Visit: Payer: Self-pay

## 2023-04-30 ENCOUNTER — Ambulatory Visit (INDEPENDENT_AMBULATORY_CARE_PROVIDER_SITE_OTHER): Payer: Medicare Other | Admitting: Neurology

## 2023-04-30 ENCOUNTER — Encounter: Payer: Self-pay | Admitting: Neurology

## 2023-04-30 ENCOUNTER — Encounter: Payer: Self-pay | Admitting: Hematology

## 2023-04-30 VITALS — BP 120/63 | HR 104 | Ht 68.0 in | Wt 228.2 lb

## 2023-04-30 DIAGNOSIS — G40219 Localization-related (focal) (partial) symptomatic epilepsy and epileptic syndromes with complex partial seizures, intractable, without status epilepticus: Secondary | ICD-10-CM | POA: Diagnosis not present

## 2023-04-30 MED ORDER — OXTELLAR XR 600 MG PO TB24: ORAL_TABLET | ORAL | 3 refills | Status: DC

## 2023-04-30 MED ORDER — XCOPRI 100 MG PO TABS: ORAL_TABLET | ORAL | 3 refills | Status: DC

## 2023-04-30 MED ORDER — LAMOTRIGINE ER 300 MG PO TB24: ORAL_TABLET | ORAL | 3 refills | Status: DC
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
## 2023-05-01 ENCOUNTER — Other Ambulatory Visit: Payer: Self-pay

## 2023-05-01 ENCOUNTER — Encounter: Payer: Self-pay | Admitting: Hematology

## 2023-05-01 DIAGNOSIS — C182 Malignant neoplasm of ascending colon: Secondary | ICD-10-CM

## 2023-05-02 ENCOUNTER — Inpatient Hospital Stay: Payer: Medicare Other

## 2023-05-02 ENCOUNTER — Inpatient Hospital Stay: Payer: Medicare Other | Attending: Hematology

## 2023-05-02 ENCOUNTER — Inpatient Hospital Stay: Payer: Medicare Other | Admitting: Hematology

## 2023-05-02 DIAGNOSIS — R918 Other nonspecific abnormal finding of lung field: Secondary | ICD-10-CM | POA: Insufficient documentation

## 2023-05-02 DIAGNOSIS — Z5112 Encounter for antineoplastic immunotherapy: Secondary | ICD-10-CM | POA: Insufficient documentation

## 2023-05-02 DIAGNOSIS — K5730 Diverticulosis of large intestine without perforation or abscess without bleeding: Secondary | ICD-10-CM | POA: Diagnosis not present

## 2023-05-02 DIAGNOSIS — C182 Malignant neoplasm of ascending colon: Secondary | ICD-10-CM | POA: Insufficient documentation

## 2023-05-02 DIAGNOSIS — Z79899 Other long term (current) drug therapy: Secondary | ICD-10-CM | POA: Insufficient documentation

## 2023-05-02 DIAGNOSIS — Z1509 Genetic susceptibility to other malignant neoplasm: Secondary | ICD-10-CM | POA: Insufficient documentation

## 2023-05-02 DIAGNOSIS — Z9049 Acquired absence of other specified parts of digestive tract: Secondary | ICD-10-CM | POA: Diagnosis not present

## 2023-05-02 DIAGNOSIS — R21 Rash and other nonspecific skin eruption: Secondary | ICD-10-CM | POA: Insufficient documentation

## 2023-05-02 DIAGNOSIS — Z803 Family history of malignant neoplasm of breast: Secondary | ICD-10-CM | POA: Insufficient documentation

## 2023-05-02 DIAGNOSIS — Z882 Allergy status to sulfonamides status: Secondary | ICD-10-CM | POA: Insufficient documentation

## 2023-05-02 DIAGNOSIS — Z1501 Genetic susceptibility to malignant neoplasm of breast: Secondary | ICD-10-CM | POA: Diagnosis not present

## 2023-05-02 DIAGNOSIS — C786 Secondary malignant neoplasm of retroperitoneum and peritoneum: Secondary | ICD-10-CM | POA: Diagnosis not present

## 2023-05-02 LAB — COMPREHENSIVE METABOLIC PANEL
ALT: 15 U/L (ref 0–44)
AST: 15 U/L (ref 15–41)
Albumin: 3.5 g/dL (ref 3.5–5.0)
Alkaline Phosphatase: 80 U/L (ref 38–126)
Anion gap: 8 (ref 5–15)
BUN: 13 mg/dL (ref 8–23)
CO2: 22 mmol/L (ref 22–32)
Calcium: 8.6 mg/dL — ABNORMAL LOW (ref 8.9–10.3)
Chloride: 106 mmol/L (ref 98–111)
Creatinine, Ser: 0.81 mg/dL (ref 0.61–1.24)
GFR, Estimated: >60 mL/min (ref >60)
Glucose, Bld: 117 mg/dL — ABNORMAL HIGH (ref 70–99)
Potassium: 4.1 mmol/L (ref 3.5–5.1)
Sodium: 136 mmol/L (ref 135–145)
Total Bilirubin: 0.3 mg/dL (ref 0.3–1.2)
Total Protein: 6.9 g/dL (ref 6.5–8.1)

## 2023-05-02 LAB — CBC WITH DIFFERENTIAL/PLATELET
Abs Immature Granulocytes: 0.09 10*3/uL — ABNORMAL HIGH (ref 0.00–0.07)
Basophils Absolute: 0.1 10*3/uL (ref 0.0–0.1)
Basophils Relative: 1 %
Eosinophils Absolute: 0.6 10*3/uL — ABNORMAL HIGH (ref 0.0–0.5)
Eosinophils Relative: 9 %
HCT: 48.6 % (ref 39.0–52.0)
Hemoglobin: 16.2 g/dL (ref 13.0–17.0)
Immature Granulocytes: 1 %
Lymphocytes Relative: 24 %
Lymphs Abs: 1.5 10*3/uL (ref 0.7–4.0)
MCH: 31.6 pg (ref 26.0–34.0)
MCHC: 33.3 g/dL (ref 30.0–36.0)
MCV: 94.7 fL (ref 80.0–100.0)
Monocytes Absolute: 0.6 10*3/uL (ref 0.1–1.0)
Monocytes Relative: 9 %
Neutro Abs: 3.5 10*3/uL (ref 1.7–7.7)
Neutrophils Relative %: 56 %
Platelets: 192 10*3/uL (ref 150–400)
RBC: 5.13 MIL/uL (ref 4.22–5.81)
RDW: 13.6 % (ref 11.5–15.5)
WBC: 6.3 10*3/uL (ref 4.0–10.5)
nRBC: 0 % (ref 0.0–0.2)

## 2023-05-02 LAB — MAGNESIUM: Magnesium: 1.4 mg/dL — ABNORMAL LOW (ref 1.7–2.4)

## 2023-05-02 MED ORDER — SODIUM CHLORIDE 0.9 % IV SOLN: Freq: Once | INTRAVENOUS | Status: AC

## 2023-05-02 MED ORDER — SODIUM CHLORIDE 0.9 % IV SOLN
6.0000 mg/kg | Freq: Once | INTRAVENOUS | Status: AC
  Administered 2023-05-02: 600 mg via INTRAVENOUS
  Filled 2023-05-02: qty 20

## 2023-05-02 MED ORDER — MAGNESIUM SULFATE 2 GM/50ML IV SOLN
2.0000 g | Freq: Once | INTRAVENOUS | Status: AC
  Administered 2023-05-02: 2 g via INTRAVENOUS
  Filled 2023-05-02: qty 50

## 2023-05-02 MED ORDER — HEPARIN SOD (PORK) LOCK FLUSH 100 UNIT/ML IV SOLN
500.0000 [IU] | Freq: Once | INTRAVENOUS | Status: AC | PRN
  Administered 2023-05-02: 500 [IU]

## 2023-05-02 MED ORDER — SODIUM CHLORIDE 0.9% FLUSH
10.0000 mL | INTRAVENOUS | Status: DC | PRN
  Administered 2023-05-02: 10 mL

## 2023-05-02 NOTE — Patient Instructions (Signed)
[facility]  Discharge Instructions: Thank you for choosing [facility] to provide your oncology and hematology care.  If you have a lab appointment with the Cancer Center - please note that after April 8th, 2024, all labs will be drawn in the cancer center.  You do not have to check in or register with the main entrance as you have in the past but will complete your check-in in the cancer center.  Wear comfortable clothing and clothing appropriate for easy access to any Portacath or PICC line.   We strive to give you quality time with your provider. You may need to reschedule your appointment if you arrive late (15 or more minutes).  Arriving late affects you and other patients whose appointments are after yours.  Also, if you miss three or more appointments without notifying the office, you may be dismissed from the clinic at the provider's discretion.      For prescription refill requests, have your pharmacy contact our office and allow 72 hours for refills to be completed.    Today you received the following chemotherapy and/or immunotherapy agents Vectibix.  Panitumumab Injection What is this medication? PANITUMUMAB (pan i TOOM ue mab) treats colorectal cancer. It works by blocking a protein that causes cancer cells to grow and multiply. This helps to slow or stop the spread of cancer cells. It is a monoclonal antibody. This medicine may be used for other purposes; ask your health care provider or pharmacist if you have questions. COMMON BRAND NAME(S): Vectibix What should I tell my care team before I take this medication? They need to know if you have any of these conditions: Eye disease Low levels of magnesium in the blood Lung disease An unusual or allergic reaction to panitumumab, other medications, foods, dyes, or preservatives Pregnant or trying to get pregnant Breast-feeding How should I use this medication? This medication is injected into a  vein. It is given by your care team in a hospital or clinic setting. Talk to your care team about the use of this medication in children. Special care may be needed. Overdosage: If you think you have taken too much of this medicine contact a poison control center or emergency room at once. NOTE: This medicine is only for you. Do not share this medicine with others. What if I miss a dose? Keep appointments for follow-up doses. It is important not to miss your dose. Call your care team if you are unable to keep an appointment. What may interact with this medication? Bevacizumab This list may not describe all possible interactions. Give your health care provider a list of all the medicines, herbs, non-prescription drugs, or dietary supplements you use. Also tell them if you smoke, drink alcohol, or use illegal drugs. Some items may interact with your medicine. What should I watch for while using this medication? Your condition will be monitored carefully while you are receiving this medication. This medication may make you feel generally unwell. This is not uncommon as chemotherapy can affect healthy cells as well as cancer cells. Report any side effects. Continue your course of treatment even though you feel ill unless your care team tells you to stop. This medication can make you more sensitive to the sun. Keep out of the sun while receiving this medication and for 2 months after stopping therapy. If you cannot avoid being in the sun, wear protective clothing and sunscreen. Do not use sun lamps, tanning beds, or tanning booths.   Check with your care team if you have severe diarrhea, nausea, and vomiting or if you sweat a lot. The loss of too much body fluid may make it dangerous for you to take this medication. This medication may cause serious skin reactions. They can happen weeks to months after starting the medication. Contact your care team right away if you notice fevers or flu-like symptoms with a  rash. The rash may be red or purple and then turn into blisters or peeling of the skin. You may also notice a red rash with swelling of the face, lips, or lymph nodes in your neck or under your arms. Talk to your care team if you may be pregnant. Serious birth defects can occur if you take this medication during pregnancy and for 2 months after the last dose. Contraception is recommended while taking this medication and for 2 months after the last dose. Your care team can help you find the option that works for you. Do not breastfeed while taking this medication and for 2 months after the last dose. This medication may cause infertility. Talk to your care team if you are concerned about your fertility. What side effects may I notice from receiving this medication? Side effects that you should report to your care team as soon as possible: Allergic reactions--skin rash, itching, hives, swelling of the face, lips, tongue, or throat Dry cough, shortness of breath or trouble breathing Eye pain, redness, irritation, or discharge with blurry or decreased vision Infusion reactions--chest pain, shortness of breath or trouble breathing, feeling faint or lightheaded Low magnesium level--muscle pain or cramps, unusual weakness or fatigue, fast or irregular heartbeat, tremors Low potassium level--muscle pain or cramps, unusual weakness or fatigue, fast or irregular heartbeat, constipation Redness, blistering, peeling, or loosening of the skin, including inside the mouth Skin reactions on sun-exposed areas Side effects that usually do not require medical attention (report to your care team if they continue or are bothersome): Change in nail shape, thickness, or color Diarrhea Dry skin Fatigue Nausea Vomiting This list may not describe all possible side effects. Call your doctor for medical advice about side effects. You may report side effects to FDA at 1-800-FDA-1088. Where should I keep my  medication? This medication is given in a hospital or clinic. It will not be stored at home. NOTE: This sheet is a summary. It may not cover all possible information. If you have questions about this medicine, talk to your doctor, pharmacist, or health care provider.  2024 Elsevier/Gold Standard (2022-03-01 00:00:00)        To help prevent nausea and vomiting after your treatment, we encourage you to take your nausea medication as directed.  BELOW ARE SYMPTOMS THAT SHOULD BE REPORTED IMMEDIATELY: *FEVER GREATER THAN 100.4 F (38 C) OR HIGHER *CHILLS OR SWEATING *NAUSEA AND VOMITING THAT IS NOT CONTROLLED WITH YOUR NAUSEA MEDICATION *UNUSUAL SHORTNESS OF BREATH *UNUSUAL BRUISING OR BLEEDING *URINARY PROBLEMS (pain or burning when urinating, or frequent urination) *BOWEL PROBLEMS (unusual diarrhea, constipation, pain near the anus) TENDERNESS IN MOUTH AND THROAT WITH OR WITHOUT PRESENCE OF ULCERS (sore throat, sores in mouth, or a toothache) UNUSUAL RASH, SWELLING OR PAIN  UNUSUAL VAGINAL DISCHARGE OR ITCHING   Items with * indicate a potential emergency and should be followed up as soon as possible or go to the Emergency Department if any problems should occur.  Please show the CHEMOTHERAPY ALERT CARD or IMMUNOTHERAPY ALERT CARD at check-in to the Emergency Department and triage nurse.  Should   you have questions after your visit or need to cancel or reschedule your appointment, please contact [facility] [phone]  and follow the prompts.  Office hours are 8:00 a.m. to 4:30 p.m. Monday - Friday. Please note that voicemails left after 4:00 p.m. may not be returned until the following business day.  We are closed weekends and major holidays. You have access to a nurse at all times for urgent questions. Please call the main number to the clinic [phone] and follow the prompts.  For any non-urgent questions, you may also contact your provider using MyChart. We now  offer e-Visits for anyone 18 and older to request care online for non-urgent symptoms. For details visit [email or web address].   Also download the MyChart app! Go to the app store, search "MyChart", open the app, select [facility], and log in with your MyChart username and password.   

## 2023-05-02 NOTE — Progress Notes (Signed)
Patient presents today for chemotherapy infusion.  Patient is in satisfactory condition with no new complaints voiced.  Vital signs are stable.  Labs reviewed and all labs are within treatment parameters.  Magnesium today is 1.4.  We will give magnesium sulfate 4 grams IV x one dose today per standing orders by Dr. Ellin Saba.  We will proceed with treatment per MD orders.    Patient tolerated treatment and magnesium infusion well with no complaints voiced.  Patient left ambulatory in stable condition.  Vital signs stable at discharge.  Follow up as scheduled.

## 2023-05-04 ENCOUNTER — Encounter: Payer: Self-pay | Admitting: Hematology

## 2023-05-04 LAB — CEA: CEA: 1.3 ng/mL (ref 0.0–4.7)

## 2023-05-09 ENCOUNTER — Encounter: Payer: Self-pay | Admitting: Hematology

## 2023-05-10 ENCOUNTER — Ambulatory Visit (HOSPITAL_COMMUNITY)
Admission: RE | Admit: 2023-05-10 | Discharge: 2023-05-10 | Disposition: A | Discharge status: Home / Self Care | Payer: Medicare Other | Source: Ambulatory Visit | Attending: Hematology | Admitting: Hematology

## 2023-05-10 DIAGNOSIS — C182 Malignant neoplasm of ascending colon: Secondary | ICD-10-CM | POA: Insufficient documentation

## 2023-05-10 MED ORDER — FLUDEOXYGLUCOSE F - 18 (FDG) INJECTION
11.2400 | Freq: Once | INTRAVENOUS | Status: AC | PRN
  Administered 2023-05-10: 11.24 via INTRAVENOUS

## 2023-05-15 ENCOUNTER — Other Ambulatory Visit: Payer: Self-pay | Admitting: Hematology

## 2023-05-15 ENCOUNTER — Other Ambulatory Visit: Payer: Self-pay

## 2023-05-15 ENCOUNTER — Other Ambulatory Visit (HOSPITAL_COMMUNITY): Payer: Self-pay

## 2023-05-15 DIAGNOSIS — C182 Malignant neoplasm of ascending colon: Secondary | ICD-10-CM

## 2023-05-15 MED ORDER — BRAFTOVI 75 MG PO CAPS
300.0000 mg | ORAL_CAPSULE | Freq: Every day | ORAL | 0 refills | Status: DC
Start: 2023-05-15 — End: 2023-06-18
  Filled 2023-05-15: qty 120, 30d supply, fill #0
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
## 2023-05-16 ENCOUNTER — Inpatient Hospital Stay: Payer: Medicare Other | Admitting: Hematology

## 2023-05-16 ENCOUNTER — Inpatient Hospital Stay: Payer: Medicare Other

## 2023-05-16 VITALS — BP 120/74 | HR 96 | Temp 99.0°F | Resp 18

## 2023-05-16 DIAGNOSIS — Z5112 Encounter for antineoplastic immunotherapy: Secondary | ICD-10-CM | POA: Diagnosis not present

## 2023-05-16 DIAGNOSIS — C182 Malignant neoplasm of ascending colon: Secondary | ICD-10-CM

## 2023-05-16 LAB — COMPREHENSIVE METABOLIC PANEL
ALT: 19 U/L (ref 0–44)
AST: 16 U/L (ref 15–41)
Albumin: 3.5 g/dL (ref 3.5–5.0)
Alkaline Phosphatase: 75 U/L (ref 38–126)
Anion gap: 5 (ref 5–15)
BUN: 15 mg/dL (ref 8–23)
CO2: 22 mmol/L (ref 22–32)
Calcium: 8.3 mg/dL — ABNORMAL LOW (ref 8.9–10.3)
Chloride: 108 mmol/L (ref 98–111)
Creatinine, Ser: 0.81 mg/dL (ref 0.61–1.24)
GFR, Estimated: >60 mL/min (ref >60)
Glucose, Bld: 114 mg/dL — ABNORMAL HIGH (ref 70–99)
Potassium: 4.6 mmol/L (ref 3.5–5.1)
Sodium: 135 mmol/L (ref 135–145)
Total Bilirubin: 0.4 mg/dL (ref 0.3–1.2)
Total Protein: 6.7 g/dL (ref 6.5–8.1)

## 2023-05-16 LAB — CBC WITH DIFFERENTIAL/PLATELET
Abs Immature Granulocytes: 0.06 10*3/uL (ref 0.00–0.07)
Basophils Absolute: 0.1 10*3/uL (ref 0.0–0.1)
Basophils Relative: 1 %
Eosinophils Absolute: 0.5 10*3/uL (ref 0.0–0.5)
Eosinophils Relative: 8 %
HCT: 44.6 % (ref 39.0–52.0)
Hemoglobin: 15 g/dL (ref 13.0–17.0)
Immature Granulocytes: 1 %
Lymphocytes Relative: 23 %
Lymphs Abs: 1.3 10*3/uL (ref 0.7–4.0)
MCH: 32 pg (ref 26.0–34.0)
MCHC: 33.6 g/dL (ref 30.0–36.0)
MCV: 95.1 fL (ref 80.0–100.0)
Monocytes Absolute: 0.4 10*3/uL (ref 0.1–1.0)
Monocytes Relative: 8 %
Neutro Abs: 3.3 10*3/uL (ref 1.7–7.7)
Neutrophils Relative %: 59 %
Platelets: 208 10*3/uL (ref 150–400)
RBC: 4.69 MIL/uL (ref 4.22–5.81)
RDW: 14 % (ref 11.5–15.5)
WBC: 5.6 10*3/uL (ref 4.0–10.5)
nRBC: 0 % (ref 0.0–0.2)

## 2023-05-16 LAB — MAGNESIUM: Magnesium: 1.3 mg/dL — ABNORMAL LOW (ref 1.7–2.4)

## 2023-05-16 MED ORDER — HEPARIN SOD (PORK) LOCK FLUSH 100 UNIT/ML IV SOLN
500.0000 [IU] | Freq: Once | INTRAVENOUS | Status: AC | PRN
  Administered 2023-05-16: 500 [IU]

## 2023-05-16 MED ORDER — SODIUM CHLORIDE 0.9% FLUSH
10.0000 mL | INTRAVENOUS | Status: DC | PRN
  Administered 2023-05-16: 10 mL

## 2023-05-16 MED ORDER — SODIUM CHLORIDE 0.9 % IV SOLN
6.0000 mg/kg | Freq: Once | INTRAVENOUS | Status: AC
  Administered 2023-05-16: 600 mg via INTRAVENOUS
  Filled 2023-05-16: qty 20

## 2023-05-16 MED ORDER — MAGNESIUM SULFATE 2 GM/50ML IV SOLN
2.0000 g | Freq: Once | INTRAVENOUS | Status: AC
  Administered 2023-05-16: 2 g via INTRAVENOUS
  Filled 2023-05-16: qty 50

## 2023-05-16 MED ORDER — MAGNESIUM OXIDE -MG SUPPLEMENT 400 (240 MG) MG PO TABS: 800.0000 mg | ORAL_TABLET | Freq: Three times a day (TID) | ORAL | 6 refills | Status: AC

## 2023-05-16 MED ORDER — MAGNESIUM OXIDE -MG SUPPLEMENT 400 (240 MG) MG PO TABS: 800.0000 mg | ORAL_TABLET | Freq: Three times a day (TID) | ORAL | 6 refills | Status: DC

## 2023-05-16 MED ORDER — SODIUM CHLORIDE 0.9 % IV SOLN: Freq: Once | INTRAVENOUS | Status: AC

## 2023-05-16 NOTE — Patient Instructions (Signed)
[facility]  Discharge Instructions: Thank you for choosing [facility] to provide your oncology and hematology care.  If you have a lab appointment with the Cancer Center - please note that after April 8th, 2024, all labs will be drawn in the cancer center.  You do not have to check in or register with the main entrance as you have in the past but will complete your check-in in the cancer center.  Wear comfortable clothing and clothing appropriate for easy access to any Portacath or PICC line.   We strive to give you quality time with your provider. You may need to reschedule your appointment if you arrive late (15 or more minutes).  Arriving late affects you and other patients whose appointments are after yours.  Also, if you miss three or more appointments without notifying the office, you may be dismissed from the clinic at the provider's discretion.      For prescription refill requests, have your pharmacy contact our office and allow 72 hours for refills to be completed.    Today you received the following chemotherapy and/or immunotherapy agents Panitumumab and 4g of magnesium, return as scheduled.   To help prevent nausea and vomiting after your treatment, we encourage you to take your nausea medication as directed.  BELOW ARE SYMPTOMS THAT SHOULD BE REPORTED IMMEDIATELY: *FEVER GREATER THAN 100.4 F (38 C) OR HIGHER *CHILLS OR SWEATING *NAUSEA AND VOMITING THAT IS NOT CONTROLLED WITH YOUR NAUSEA MEDICATION *UNUSUAL SHORTNESS OF BREATH *UNUSUAL BRUISING OR BLEEDING *URINARY PROBLEMS (pain or burning when urinating, or frequent urination) *BOWEL PROBLEMS (unusual diarrhea, constipation, pain near the anus) TENDERNESS IN MOUTH AND THROAT WITH OR WITHOUT PRESENCE OF ULCERS (sore throat, sores in mouth, or a toothache) UNUSUAL RASH, SWELLING OR PAIN  UNUSUAL VAGINAL DISCHARGE OR ITCHING   Items with * indicate a potential emergency and should be  followed up as soon as possible or go to the Emergency Department if any problems should occur.  Please show the CHEMOTHERAPY ALERT CARD or IMMUNOTHERAPY ALERT CARD at check-in to the Emergency Department and triage nurse.  Should you have questions after your visit or need to cancel or reschedule your appointment, please contact [facility] [phone]  and follow the prompts.  Office hours are 8:00 a.m. to 4:30 p.m. Monday - Friday. Please note that voicemails left after 4:00 p.m. may not be returned until the following business day.  We are closed weekends and major holidays. You have access to a nurse at all times for urgent questions. Please call the main number to the clinic [phone] and follow the prompts.  For any non-urgent questions, you may also contact your provider using MyChart. We now offer e-Visits for anyone 79 and older to request care online for non-urgent symptoms. For details visit [email or web address].   Also download the MyChart app! Go to the app store, search "MyChart", open the app, select [facility], and log in with your MyChart username and password.

## 2023-05-16 NOTE — Progress Notes (Signed)
 Patient tolerated therapy with no complaints voiced.  Side effects with management reviewed with understanding verbalized.  Port site clean and dry with no bruising or swelling noted at site.  Good blood return noted before and after administration of therapy.  Band aid applied.  Patient left in satisfactory condition with VSS and no s/s of distress noted.  

## 2023-05-16 NOTE — Patient Instructions (Addendum)
[facility]  Discharge Instructions  You were seen and examined today by Dr. Ellin Saba.  Dr. Ellin Saba discussed your most recent lab work and CT scan which revealed that everything looks good and stable except your magnesium is low.  Continue getting the infusion every 2 weeks.  Follow-up as scheduled in 6 weeks.    Thank you for choosing [facility] to provide your oncology and hematology care.   To afford each patient quality time with our provider, please arrive at least 15 minutes before your scheduled appointment time. You may need to reschedule your appointment if you arrive late (10 or more minutes). Arriving late affects you and other patients whose appointments are after yours.  Also, if you miss three or more appointments without notifying the office, you may be dismissed from the clinic at the provider's discretion.    Again, thank you for choosing [facility].  Our hope is that these requests will decrease the amount of time that you wait before being seen by our physicians.   If you have a lab appointment with the Cancer Center - please note that after April 8th, all labs will be drawn in the cancer center.  You do not have to check in or register with the main entrance as you have in the past but will complete your check-in at the cancer center.            _____________________________________________________________  Should you have questions after your visit to [facility], please contact our office at [phone] and follow the prompts.  Our office hours are 8:00 a.m. to 4:30 p.m. Monday - Thursday and 8:00 a.m. to 2:30 p.m. Friday.  Please note that voicemails left after 4:00 p.m. may not be returned until the following business day.  We are closed weekends and all major holidays.  You do have access to a nurse 24-7, just call the main number to the clinic [phone] and do not press any  options, hold on the line and a nurse will answer the phone.    For prescription refill requests, have your pharmacy contact our office and allow 72 hours.    Masks are no longer required in the cancer centers. If you would like for your care team to wear a mask while they are taking care of you, please let them know. You may have one support person who is at least 62 years old accompany you for your appointments.

## 2023-05-16 NOTE — Progress Notes (Signed)
Patient presents today for Vectibix infusion. Patient is in satisfactory condition with no new complaints voiced.  Vital signs are stable.  Labs reviewed by Dr. Ellin Saba during the office visit and all labs are within treatment parameters. Pt's magnesium is 1.3 today, pt will receive 4g IV magnesium sulfate per Dr.K's standing order.  We will proceed with treatment per MD orders.

## 2023-05-17 ENCOUNTER — Other Ambulatory Visit (HOSPITAL_COMMUNITY): Payer: Self-pay

## 2023-05-18 ENCOUNTER — Other Ambulatory Visit (HOSPITAL_COMMUNITY): Payer: Self-pay

## 2023-05-21 ENCOUNTER — Other Ambulatory Visit (HOSPITAL_COMMUNITY): Payer: Self-pay

## 2023-05-21 ENCOUNTER — Other Ambulatory Visit: Payer: Self-pay

## 2023-05-29 ENCOUNTER — Inpatient Hospital Stay: Payer: Medicare Other | Admitting: Hematology

## 2023-05-29 ENCOUNTER — Inpatient Hospital Stay: Payer: Medicare Other

## 2023-05-30 ENCOUNTER — Inpatient Hospital Stay: Payer: Medicare Other

## 2023-05-30 VITALS — BP 130/81 | HR 88 | Temp 97.3°F | Resp 18

## 2023-05-30 DIAGNOSIS — Z5112 Encounter for antineoplastic immunotherapy: Secondary | ICD-10-CM | POA: Diagnosis not present

## 2023-05-30 DIAGNOSIS — C182 Malignant neoplasm of ascending colon: Secondary | ICD-10-CM

## 2023-05-30 LAB — COMPREHENSIVE METABOLIC PANEL
ALT: 18 U/L (ref 0–44)
AST: 19 U/L (ref 15–41)
Albumin: 3.6 g/dL (ref 3.5–5.0)
Alkaline Phosphatase: 78 U/L (ref 38–126)
Anion gap: 10 (ref 5–15)
BUN: 13 mg/dL (ref 8–23)
CO2: 21 mmol/L — ABNORMAL LOW (ref 22–32)
Calcium: 8.7 mg/dL — ABNORMAL LOW (ref 8.9–10.3)
Chloride: 105 mmol/L (ref 98–111)
Creatinine, Ser: 0.7 mg/dL (ref 0.61–1.24)
GFR, Estimated: >60 mL/min (ref >60)
Glucose, Bld: 100 mg/dL — ABNORMAL HIGH (ref 70–99)
Potassium: 4.1 mmol/L (ref 3.5–5.1)
Sodium: 136 mmol/L (ref 135–145)
Total Bilirubin: 0.5 mg/dL (ref 0.3–1.2)
Total Protein: 6.9 g/dL (ref 6.5–8.1)

## 2023-05-30 LAB — CBC WITH DIFFERENTIAL/PLATELET
Abs Immature Granulocytes: 0.07 10*3/uL (ref 0.00–0.07)
Basophils Absolute: 0.1 10*3/uL (ref 0.0–0.1)
Basophils Relative: 1 %
Eosinophils Absolute: 0.5 10*3/uL (ref 0.0–0.5)
Eosinophils Relative: 9 %
HCT: 45 % (ref 39.0–52.0)
Hemoglobin: 15.2 g/dL (ref 13.0–17.0)
Immature Granulocytes: 1 %
Lymphocytes Relative: 36 %
Lymphs Abs: 1.9 10*3/uL (ref 0.7–4.0)
MCH: 32 pg (ref 26.0–34.0)
MCHC: 33.8 g/dL (ref 30.0–36.0)
MCV: 94.7 fL (ref 80.0–100.0)
Monocytes Absolute: 0.5 10*3/uL (ref 0.1–1.0)
Monocytes Relative: 9 %
Neutro Abs: 2.3 10*3/uL (ref 1.7–7.7)
Neutrophils Relative %: 44 %
Platelets: 195 10*3/uL (ref 150–400)
RBC: 4.75 MIL/uL (ref 4.22–5.81)
RDW: 13.5 % (ref 11.5–15.5)
WBC: 5.3 10*3/uL (ref 4.0–10.5)
nRBC: 0 % (ref 0.0–0.2)

## 2023-05-30 LAB — MAGNESIUM: Magnesium: 1.5 mg/dL — ABNORMAL LOW (ref 1.7–2.4)

## 2023-05-30 MED ORDER — SODIUM CHLORIDE 0.9% FLUSH
10.0000 mL | Freq: Once | INTRAVENOUS | Status: AC
  Administered 2023-05-30: 10 mL via INTRAVENOUS

## 2023-05-30 MED ORDER — SODIUM CHLORIDE 0.9 % IV SOLN
6.0000 mg/kg | Freq: Once | INTRAVENOUS | Status: AC
  Administered 2023-05-30: 600 mg via INTRAVENOUS
  Filled 2023-05-30: qty 20

## 2023-05-30 MED ORDER — SODIUM CHLORIDE 0.9% FLUSH
10.0000 mL | INTRAVENOUS | Status: DC | PRN
  Administered 2023-05-30: 10 mL

## 2023-05-30 MED ORDER — SODIUM CHLORIDE 0.9 % IV SOLN: Freq: Once | INTRAVENOUS | Status: AC

## 2023-05-30 MED ORDER — HEPARIN SOD (PORK) LOCK FLUSH 100 UNIT/ML IV SOLN
500.0000 [IU] | Freq: Once | INTRAVENOUS | Status: AC | PRN
  Administered 2023-05-30: 500 [IU]

## 2023-05-30 MED ORDER — MAGNESIUM SULFATE 2 GM/50ML IV SOLN
2.0000 g | Freq: Once | INTRAVENOUS | Status: AC
  Administered 2023-05-30: 2 g via INTRAVENOUS
  Filled 2023-05-30: qty 50

## 2023-05-30 NOTE — Progress Notes (Signed)
Magnesium 1.5 today.  Patient to be given Magnesium 2 gm IV standing orders Dr. Ellin Saba.   Patient tolerated chemotherapy with no complaints voiced.  Side effects with management reviewed with understanding verbalized.  Port site clean and dry with no bruising or swelling noted at site.  Good blood return noted before and after administration of chemotherapy.  Band aid applied.  Patient left in satisfactory condition with VSS and no s/s of distress noted.

## 2023-05-30 NOTE — Patient Instructions (Signed)
[facility]  Discharge Instructions: Thank you for choosing [facility] to provide your oncology and hematology care.  If you have a lab appointment with the Cancer Center - please note that after April 8th, 2024, all labs will be drawn in the cancer center.  You do not have to check in or register with the main entrance as you have in the past but will complete your check-in in the cancer center.  Wear comfortable clothing and clothing appropriate for easy access to any Portacath or PICC line.   We strive to give you quality time with your provider. You may need to reschedule your appointment if you arrive late (15 or more minutes).  Arriving late affects you and other patients whose appointments are after yours.  Also, if you miss three or more appointments without notifying the office, you may be dismissed from the clinic at the provider's discretion.      For prescription refill requests, have your pharmacy contact our office and allow 72 hours for refills to be completed.    Today you received the following chemotherapy and/or immunotherapy agents vectibix.        To help prevent nausea and vomiting after your treatment, we encourage you to take your nausea medication as directed.  BELOW ARE SYMPTOMS THAT SHOULD BE REPORTED IMMEDIATELY: *FEVER GREATER THAN 100.4 F (38 C) OR HIGHER *CHILLS OR SWEATING *NAUSEA AND VOMITING THAT IS NOT CONTROLLED WITH YOUR NAUSEA MEDICATION *UNUSUAL SHORTNESS OF BREATH *UNUSUAL BRUISING OR BLEEDING *URINARY PROBLEMS (pain or burning when urinating, or frequent urination) *BOWEL PROBLEMS (unusual diarrhea, constipation, pain near the anus) TENDERNESS IN MOUTH AND THROAT WITH OR WITHOUT PRESENCE OF ULCERS (sore throat, sores in mouth, or a toothache) UNUSUAL RASH, SWELLING OR PAIN  UNUSUAL VAGINAL DISCHARGE OR ITCHING   Items with * indicate a potential emergency and should be followed up as soon as possible or go to  the Emergency Department if any problems should occur.  Please show the CHEMOTHERAPY ALERT CARD or IMMUNOTHERAPY ALERT CARD at check-in to the Emergency Department and triage nurse.  Should you have questions after your visit or need to cancel or reschedule your appointment, please contact [facility] [phone]  and follow the prompts.  Office hours are 8:00 a.m. to 4:30 p.m. Monday - Friday. Please note that voicemails left after 4:00 p.m. may not be returned until the following business day.  We are closed weekends and major holidays. You have access to a nurse at all times for urgent questions. Please call the main number to the clinic [phone] and follow the prompts.  For any non-urgent questions, you may also contact your provider using MyChart. We now offer e-Visits for anyone 56 and older to request care online for non-urgent symptoms. For details visit [email or web address].   Also download the MyChart app! Go to the app store, search "MyChart", open the app, select [place], and log in with your MyChart username and password.

## 2023-05-30 NOTE — Progress Notes (Signed)
Patients port flushed without difficulty.  Good blood return noted with no bruising or swelling noted at site.  Stable during access and blood draw.  Patient to remain accessed for treatment. 

## 2023-06-01 ENCOUNTER — Other Ambulatory Visit: Payer: Self-pay

## 2023-06-01 MED ORDER — CLINDAMYCIN PHOSPHATE 1 % EX GEL: Freq: Two times a day (BID) | CUTANEOUS | 3 refills | Status: AC

## 2023-06-05 ENCOUNTER — Ambulatory Visit: Payer: Medicare Other | Admitting: Neurology

## 2023-06-06 ENCOUNTER — Telehealth: Payer: Self-pay | Admitting: Pharmacy Technician

## 2023-06-06 ENCOUNTER — Other Ambulatory Visit (HOSPITAL_COMMUNITY): Payer: Self-pay

## 2023-06-06 NOTE — Telephone Encounter (Signed)
Pharmacy Patient Advocate Encounter   Received notification from CoverMyMeds that prior authorization for OXTELLAR XR 600MG  is required/requested.   Insurance verification completed.   The patient is insured through [facility] .   Per test claim: PA required; PA submitted to [facility] via CoverMyMeds Key/confirmation #/EOC [id number] Status is pending

## 2023-06-06 NOTE — Telephone Encounter (Signed)
 Pharmacy Patient Advocate Encounter   Received notification from CoverMyMeds that prior authorization for LAMOTRIGINE ER 300MG  is required/requested.   Insurance verification completed.   The patient is insured through [facility] .   Per test claim: PA required; PA submitted to [facility] via CoverMyMeds Key/confirmation #/EOC [id number] Status is pending

## 2023-06-07 ENCOUNTER — Other Ambulatory Visit: Payer: Self-pay

## 2023-06-07 ENCOUNTER — Other Ambulatory Visit (HOSPITAL_COMMUNITY): Payer: Self-pay

## 2023-06-11 ENCOUNTER — Other Ambulatory Visit (HOSPITAL_COMMUNITY): Payer: Self-pay

## 2023-06-11 ENCOUNTER — Other Ambulatory Visit: Payer: Self-pay

## 2023-06-11 ENCOUNTER — Encounter: Payer: Self-pay | Admitting: Hematology

## 2023-06-11 MED ORDER — OXTELLAR XR 600 MG PO TB24: ORAL_TABLET | ORAL | 3 refills | Status: DC

## 2023-06-11 MED ORDER — LAMOTRIGINE ER 300 MG PO TB24: ORAL_TABLET | ORAL | 3 refills | Status: DC

## 2023-06-11 NOTE — Telephone Encounter (Signed)
Pharmacy Patient Advocate Encounter  Received notification from [facility] Medicaid that Prior Authorization for lamoTRIgine ER 300MG  er tablets has been APPROVED from 06-06-2023 to 10-29-2098   PA #/Case ID/Reference #: [id number]

## 2023-06-11 NOTE — Telephone Encounter (Signed)
Pt called an informed of approval

## 2023-06-11 NOTE — Telephone Encounter (Signed)
Pharmacy Patient Advocate Encounter  Received notification from [facility] Medicaid that Prior Authorization for Oxtellar XR 600MG  er tablets has been APPROVED from 06-06-2023 to 10-29-2098   PA #/Case ID/Reference #: [id number]

## 2023-06-12 ENCOUNTER — Ambulatory Visit (HOSPITAL_COMMUNITY)
Admission: RE | Admit: 2023-06-12 | Discharge: 2023-06-12 | Disposition: A | Discharge status: Home / Self Care | Payer: Medicare Other | Source: Ambulatory Visit | Attending: Hematology | Admitting: Hematology

## 2023-06-12 DIAGNOSIS — Z79899 Other long term (current) drug therapy: Secondary | ICD-10-CM

## 2023-06-12 LAB — ECHOCARDIOGRAM COMPLETE
AR max vel: 3.41 cm2
AV Area VTI: 3.33 cm2
AV Area mean vel: 3.31 cm2
AV Mean grad: 3.9 mmHg
AV Peak grad: 7.2 mmHg
Ao pk vel: 1.34 m/s
Area-P 1/2: 5.23 cm2
Calc EF: 48.1 %
S' Lateral: 4 cm
Single Plane A2C EF: 50.4 %
Single Plane A4C EF: 49.6 %

## 2023-06-14 ENCOUNTER — Inpatient Hospital Stay: Payer: Medicare Other | Attending: Hematology

## 2023-06-14 ENCOUNTER — Inpatient Hospital Stay: Payer: Medicare Other

## 2023-06-14 ENCOUNTER — Inpatient Hospital Stay: Payer: Medicare Other | Admitting: Hematology

## 2023-06-14 ENCOUNTER — Encounter: Payer: Self-pay | Admitting: Hematology

## 2023-06-14 VITALS — BP 121/72 | HR 90 | Temp 98.0°F | Resp 18

## 2023-06-14 DIAGNOSIS — Z5112 Encounter for antineoplastic immunotherapy: Secondary | ICD-10-CM | POA: Insufficient documentation

## 2023-06-14 DIAGNOSIS — Z87891 Personal history of nicotine dependence: Secondary | ICD-10-CM | POA: Diagnosis not present

## 2023-06-14 DIAGNOSIS — Z79899 Other long term (current) drug therapy: Secondary | ICD-10-CM | POA: Insufficient documentation

## 2023-06-14 DIAGNOSIS — Z832 Family history of diseases of the blood and blood-forming organs and certain disorders involving the immune mechanism: Secondary | ICD-10-CM | POA: Insufficient documentation

## 2023-06-14 DIAGNOSIS — Z8269 Family history of other diseases of the musculoskeletal system and connective tissue: Secondary | ICD-10-CM | POA: Insufficient documentation

## 2023-06-14 DIAGNOSIS — L270 Generalized skin eruption due to drugs and medicaments taken internally: Secondary | ICD-10-CM | POA: Insufficient documentation

## 2023-06-14 DIAGNOSIS — Z8261 Family history of arthritis: Secondary | ICD-10-CM | POA: Diagnosis not present

## 2023-06-14 DIAGNOSIS — C182 Malignant neoplasm of ascending colon: Secondary | ICD-10-CM

## 2023-06-14 DIAGNOSIS — Z8249 Family history of ischemic heart disease and other diseases of the circulatory system: Secondary | ICD-10-CM | POA: Insufficient documentation

## 2023-06-14 LAB — CBC WITH DIFFERENTIAL/PLATELET
Abs Immature Granulocytes: 0.06 10*3/uL (ref 0.00–0.07)
Basophils Absolute: 0 10*3/uL (ref 0.0–0.1)
Basophils Relative: 1 %
Eosinophils Absolute: 0.5 10*3/uL (ref 0.0–0.5)
Eosinophils Relative: 7 %
HCT: 44.2 % (ref 39.0–52.0)
Hemoglobin: 14.5 g/dL (ref 13.0–17.0)
Immature Granulocytes: 1 %
Lymphocytes Relative: 21 %
Lymphs Abs: 1.4 10*3/uL (ref 0.7–4.0)
MCH: 31.3 pg (ref 26.0–34.0)
MCHC: 32.8 g/dL (ref 30.0–36.0)
MCV: 95.3 fL (ref 80.0–100.0)
Monocytes Absolute: 0.7 10*3/uL (ref 0.1–1.0)
Monocytes Relative: 10 %
Neutro Abs: 4.2 10*3/uL (ref 1.7–7.7)
Neutrophils Relative %: 60 %
Platelets: 200 10*3/uL (ref 150–400)
RBC: 4.64 MIL/uL (ref 4.22–5.81)
RDW: 13.4 % (ref 11.5–15.5)
WBC: 6.9 10*3/uL (ref 4.0–10.5)
nRBC: 0 % (ref 0.0–0.2)

## 2023-06-14 LAB — COMPREHENSIVE METABOLIC PANEL
ALT: 17 U/L (ref 0–44)
AST: 16 U/L (ref 15–41)
Albumin: 3.5 g/dL (ref 3.5–5.0)
Alkaline Phosphatase: 78 U/L (ref 38–126)
Anion gap: 7 (ref 5–15)
BUN: 12 mg/dL (ref 8–23)
CO2: 23 mmol/L (ref 22–32)
Calcium: 8.6 mg/dL — ABNORMAL LOW (ref 8.9–10.3)
Chloride: 108 mmol/L (ref 98–111)
Creatinine, Ser: 0.76 mg/dL (ref 0.61–1.24)
GFR, Estimated: >60 mL/min (ref >60)
Glucose, Bld: 119 mg/dL — ABNORMAL HIGH (ref 70–99)
Potassium: 4 mmol/L (ref 3.5–5.1)
Sodium: 138 mmol/L (ref 135–145)
Total Bilirubin: 0.5 mg/dL (ref 0.3–1.2)
Total Protein: 6.5 g/dL (ref 6.5–8.1)

## 2023-06-14 LAB — MAGNESIUM: Magnesium: 1.4 mg/dL — ABNORMAL LOW (ref 1.7–2.4)

## 2023-06-14 MED ORDER — MAGNESIUM SULFATE 4 GM/100ML IV SOLN
4.0000 g | Freq: Once | INTRAVENOUS | Status: AC
  Administered 2023-06-14: 4 g via INTRAVENOUS
  Filled 2023-06-14: qty 100

## 2023-06-14 MED ORDER — SODIUM CHLORIDE 0.9% FLUSH
10.0000 mL | INTRAVENOUS | Status: DC | PRN
  Administered 2023-06-14: 10 mL

## 2023-06-14 MED ORDER — HEPARIN SOD (PORK) LOCK FLUSH 100 UNIT/ML IV SOLN
500.0000 [IU] | Freq: Once | INTRAVENOUS | Status: AC | PRN
  Administered 2023-06-14: 500 [IU]

## 2023-06-14 MED ORDER — SODIUM CHLORIDE 0.9% FLUSH
10.0000 mL | Freq: Once | INTRAVENOUS | Status: AC
  Administered 2023-06-14: 10 mL via INTRAVENOUS

## 2023-06-14 MED ORDER — SODIUM CHLORIDE 0.9 % IV SOLN
6.0000 mg/kg | Freq: Once | INTRAVENOUS | Status: AC
  Administered 2023-06-14: 600 mg via INTRAVENOUS
  Filled 2023-06-14: qty 20

## 2023-06-14 MED ORDER — SODIUM CHLORIDE 0.9 % IV SOLN: Freq: Once | INTRAVENOUS | Status: AC

## 2023-06-14 NOTE — Progress Notes (Signed)
Patient presents today for Vectibix infusion.  Patient is in satisfactory condition with no new complaints voiced.  Vital signs are stable.  Labs reviewed and all labs are within treatment parameters. Pt's magnesium is 1.4 today, pt will receive 4g IV magnesium sulfate per Dr.K's standing orders. We will proceed with treatment per MD orders.    Treatment given today per MD orders. Tolerated infusion without adverse affects. Vital signs stable. No complaints at this time. Discharged from clinic ambulatory in stable condition. Alert and oriented x 3. F/U with [facility] as scheduled.

## 2023-06-14 NOTE — Patient Instructions (Signed)
[facility]  Discharge Instructions: Thank you for choosing [facility] to provide your oncology and hematology care.  If you have a lab appointment with the Cancer Center - please note that after April 8th, 2024, all labs will be drawn in the cancer center.  You do not have to check in or register with the main entrance as you have in the past but will complete your check-in in the cancer center.  Wear comfortable clothing and clothing appropriate for easy access to any Portacath or PICC line.   We strive to give you quality time with your provider. You may need to reschedule your appointment if you arrive late (15 or more minutes).  Arriving late affects you and other patients whose appointments are after yours.  Also, if you miss three or more appointments without notifying the office, you may be dismissed from the clinic at the provider's discretion.      For prescription refill requests, have your pharmacy contact our office and allow 72 hours for refills to be completed.    Today you received the following chemotherapy and/or immunotherapy agents Vecitibix and 4g IV magnesium sulfate.   To help prevent nausea and vomiting after your treatment, we encourage you to take your nausea medication as directed.  Panitumumab Injection What is this medication? PANITUMUMAB (pan i TOOM ue mab) treats colorectal cancer. It works by blocking a protein that causes cancer cells to grow and multiply. This helps to slow or stop the spread of cancer cells. It is a monoclonal antibody. This medicine may be used for other purposes; ask your health care provider or pharmacist if you have questions. COMMON BRAND NAME(S): Vectibix What should I tell my care team before I take this medication? They need to know if you have any of these conditions: Eye disease Low levels of magnesium in the blood Lung disease An unusual or allergic reaction to panitumumab, other medications,  foods, dyes, or preservatives Pregnant or trying to get pregnant Breast-feeding How should I use this medication? This medication is injected into a vein. It is given by your care team in a hospital or clinic setting. Talk to your care team about the use of this medication in children. Special care may be needed. Overdosage: If you think you have taken too much of this medicine contact a poison control center or emergency room at once. NOTE: This medicine is only for you. Do not share this medicine with others. What if I miss a dose? Keep appointments for follow-up doses. It is important not to miss your dose. Call your care team if you are unable to keep an appointment. What may interact with this medication? Bevacizumab This list may not describe all possible interactions. Give your health care provider a list of all the medicines, herbs, non-prescription drugs, or dietary supplements you use. Also tell them if you smoke, drink alcohol, or use illegal drugs. Some items may interact with your medicine. What should I watch for while using this medication? Your condition will be monitored carefully while you are receiving this medication. This medication may make you feel generally unwell. This is not uncommon as chemotherapy can affect healthy cells as well as cancer cells. Report any side effects. Continue your course of treatment even though you feel ill unless your care team tells you to stop. This medication can make you more sensitive to the sun. Keep out of the sun while receiving this medication and for 2 months  after stopping therapy. If you cannot avoid being in the sun, wear protective clothing and sunscreen. Do not use sun lamps, tanning beds, or tanning booths. Check with your care team if you have severe diarrhea, nausea, and vomiting or if you sweat a lot. The loss of too much body fluid may make it dangerous for you to take this medication. This medication may cause serious skin  reactions. They can happen weeks to months after starting the medication. Contact your care team right away if you notice fevers or flu-like symptoms with a rash. The rash may be red or purple and then turn into blisters or peeling of the skin. You may also notice a red rash with swelling of the face, lips, or lymph nodes in your neck or under your arms. Talk to your care team if you may be pregnant. Serious birth defects can occur if you take this medication during pregnancy and for 2 months after the last dose. Contraception is recommended while taking this medication and for 2 months after the last dose. Your care team can help you find the option that works for you. Do not breastfeed while taking this medication and for 2 months after the last dose. This medication may cause infertility. Talk to your care team if you are concerned about your fertility. What side effects may I notice from receiving this medication? Side effects that you should report to your care team as soon as possible: Allergic reactions--skin rash, itching, hives, swelling of the face, lips, tongue, or throat Dry cough, shortness of breath or trouble breathing Eye pain, redness, irritation, or discharge with blurry or decreased vision Infusion reactions--chest pain, shortness of breath or trouble breathing, feeling faint or lightheaded Low magnesium level--muscle pain or cramps, unusual weakness or fatigue, fast or irregular heartbeat, tremors Low potassium level--muscle pain or cramps, unusual weakness or fatigue, fast or irregular heartbeat, constipation Redness, blistering, peeling, or loosening of the skin, including inside the mouth Skin reactions on sun-exposed areas Side effects that usually do not require medical attention (report to your care team if they continue or are bothersome): Change in nail shape, thickness, or color Diarrhea Dry skin Fatigue Nausea Vomiting This list may not describe all possible side  effects. Call your doctor for medical advice about side effects. You may report side effects to FDA at 1-800-FDA-1088. Where should I keep my medication? This medication is given in a hospital or clinic. It will not be stored at home. NOTE: This sheet is a summary. It may not cover all possible information. If you have questions about this medicine, talk to your doctor, pharmacist, or health care provider.  2024 Elsevier/Gold Standard (2022-03-01 00:00:00)   BELOW ARE SYMPTOMS THAT SHOULD BE REPORTED IMMEDIATELY: *FEVER GREATER THAN 100.4 F (38 C) OR HIGHER *CHILLS OR SWEATING *NAUSEA AND VOMITING THAT IS NOT CONTROLLED WITH YOUR NAUSEA MEDICATION *UNUSUAL SHORTNESS OF BREATH *UNUSUAL BRUISING OR BLEEDING *URINARY PROBLEMS (pain or burning when urinating, or frequent urination) *BOWEL PROBLEMS (unusual diarrhea, constipation, pain near the anus) TENDERNESS IN MOUTH AND THROAT WITH OR WITHOUT PRESENCE OF ULCERS (sore throat, sores in mouth, or a toothache) UNUSUAL RASH, SWELLING OR PAIN  UNUSUAL VAGINAL DISCHARGE OR ITCHING   Items with * indicate a potential emergency and should be followed up as soon as possible or go to the Emergency Department if any problems should occur.  Please show the CHEMOTHERAPY ALERT CARD or IMMUNOTHERAPY ALERT CARD at check-in to the Emergency Department and triage nurse.  Should you have questions after your visit or need to cancel or reschedule your appointment, please contact [facility] [phone]  and follow the prompts.  Office hours are 8:00 a.m. to 4:30 p.m. Monday - Friday. Please note that voicemails left after 4:00 p.m. may not be returned until the following business day.  We are closed weekends and major holidays. You have access to a nurse at all times for urgent questions. Please call the main number to the clinic [phone] and follow the prompts.  For any non-urgent questions, you may also contact your provider using  MyChart. We now offer e-Visits for anyone 88 and older to request care online for non-urgent symptoms. For details visit [email or web address].   Also download the MyChart app! Go to the app store, search "MyChart", open the app, select [place], and log in with your MyChart username and password.

## 2023-06-14 NOTE — Progress Notes (Signed)
Patients port flushed without difficulty.  Good blood return noted with no bruising or swelling noted at site.  Stable during access and blood draw.  Patient to remain accessed for treatment. 

## 2023-06-15 ENCOUNTER — Other Ambulatory Visit: Payer: Self-pay

## 2023-06-15 MED ORDER — DOXYCYCLINE HYCLATE 100 MG PO TABS: 100.0000 mg | ORAL_TABLET | Freq: Two times a day (BID) | ORAL | 3 refills | Status: DC

## 2023-06-18 ENCOUNTER — Other Ambulatory Visit: Payer: Self-pay | Admitting: Hematology

## 2023-06-18 ENCOUNTER — Other Ambulatory Visit (HOSPITAL_COMMUNITY): Payer: Self-pay

## 2023-06-18 ENCOUNTER — Other Ambulatory Visit: Payer: Self-pay

## 2023-06-18 DIAGNOSIS — C182 Malignant neoplasm of ascending colon: Secondary | ICD-10-CM

## 2023-06-18 MED ORDER — BRAFTOVI 75 MG PO CAPS
300.0000 mg | ORAL_CAPSULE | Freq: Every day | ORAL | 0 refills | Status: DC
  Filled 2023-06-18: qty 120, 30d supply, fill #0

## 2023-06-19 ENCOUNTER — Other Ambulatory Visit (HOSPITAL_COMMUNITY): Payer: Self-pay

## 2023-06-19 ENCOUNTER — Other Ambulatory Visit: Payer: Self-pay

## 2023-06-20 ENCOUNTER — Other Ambulatory Visit (HOSPITAL_COMMUNITY): Payer: Self-pay

## 2023-06-20 ENCOUNTER — Encounter: Payer: Self-pay | Admitting: Hematology

## 2023-06-28 ENCOUNTER — Inpatient Hospital Stay: Payer: Medicare Other

## 2023-06-28 ENCOUNTER — Inpatient Hospital Stay (HOSPITAL_BASED_OUTPATIENT_CLINIC_OR_DEPARTMENT_OTHER): Payer: Medicare Other | Admitting: Hematology

## 2023-06-28 ENCOUNTER — Encounter: Payer: Self-pay | Admitting: Hematology

## 2023-06-28 ENCOUNTER — Inpatient Hospital Stay: Payer: Medicare Other | Admitting: Hematology

## 2023-06-28 VITALS — BP 118/69 | HR 84 | Temp 97.0°F | Resp 18

## 2023-06-28 VITALS — BP 127/74 | HR 88 | Temp 97.3°F | Resp 18 | Ht 68.0 in | Wt 227.4 lb

## 2023-06-28 DIAGNOSIS — Z5112 Encounter for antineoplastic immunotherapy: Secondary | ICD-10-CM | POA: Diagnosis not present

## 2023-06-28 DIAGNOSIS — C182 Malignant neoplasm of ascending colon: Secondary | ICD-10-CM | POA: Diagnosis not present

## 2023-06-28 DIAGNOSIS — Z95828 Presence of other vascular implants and grafts: Secondary | ICD-10-CM

## 2023-06-28 LAB — CBC WITH DIFFERENTIAL/PLATELET
Abs Immature Granulocytes: 0.03 10*3/uL (ref 0.00–0.07)
Basophils Absolute: 0.1 10*3/uL (ref 0.0–0.1)
Basophils Relative: 1 %
Eosinophils Absolute: 0.4 10*3/uL (ref 0.0–0.5)
Eosinophils Relative: 7 %
HCT: 46.4 % (ref 39.0–52.0)
Hemoglobin: 15.3 g/dL (ref 13.0–17.0)
Immature Granulocytes: 1 %
Lymphocytes Relative: 25 %
Lymphs Abs: 1.3 10*3/uL (ref 0.7–4.0)
MCH: 31.2 pg (ref 26.0–34.0)
MCHC: 33 g/dL (ref 30.0–36.0)
MCV: 94.5 fL (ref 80.0–100.0)
Monocytes Absolute: 0.5 10*3/uL (ref 0.1–1.0)
Monocytes Relative: 10 %
Neutro Abs: 2.9 10*3/uL (ref 1.7–7.7)
Neutrophils Relative %: 56 %
Platelets: 187 10*3/uL (ref 150–400)
RBC: 4.91 MIL/uL (ref 4.22–5.81)
RDW: 13.2 % (ref 11.5–15.5)
WBC: 5.3 10*3/uL (ref 4.0–10.5)
nRBC: 0 % (ref 0.0–0.2)

## 2023-06-28 LAB — COMPREHENSIVE METABOLIC PANEL
ALT: 17 U/L (ref 0–44)
AST: 16 U/L (ref 15–41)
Albumin: 3.6 g/dL (ref 3.5–5.0)
Alkaline Phosphatase: 81 U/L (ref 38–126)
Anion gap: 5 (ref 5–15)
BUN: 11 mg/dL (ref 8–23)
CO2: 23 mmol/L (ref 22–32)
Calcium: 8.3 mg/dL — ABNORMAL LOW (ref 8.9–10.3)
Chloride: 108 mmol/L (ref 98–111)
Creatinine, Ser: 0.79 mg/dL (ref 0.61–1.24)
GFR, Estimated: >60 mL/min (ref >60)
Glucose, Bld: 120 mg/dL — ABNORMAL HIGH (ref 70–99)
Potassium: 4.3 mmol/L (ref 3.5–5.1)
Sodium: 136 mmol/L (ref 135–145)
Total Bilirubin: 0.6 mg/dL (ref 0.3–1.2)
Total Protein: 6.7 g/dL (ref 6.5–8.1)

## 2023-06-28 LAB — MAGNESIUM: Magnesium: 1.6 mg/dL — ABNORMAL LOW (ref 1.7–2.4)

## 2023-06-28 MED ORDER — SODIUM CHLORIDE 0.9% FLUSH
10.0000 mL | INTRAVENOUS | Status: DC | PRN
  Administered 2023-06-28: 10 mL via INTRAVENOUS

## 2023-06-28 MED ORDER — SODIUM CHLORIDE 0.9 % IV SOLN
6.0000 mg/kg | Freq: Once | INTRAVENOUS | Status: AC
  Administered 2023-06-28: 600 mg via INTRAVENOUS
  Filled 2023-06-28: qty 20

## 2023-06-28 MED ORDER — HEPARIN SOD (PORK) LOCK FLUSH 100 UNIT/ML IV SOLN
500.0000 [IU] | Freq: Once | INTRAVENOUS | Status: AC | PRN
  Administered 2023-06-28: 500 [IU]

## 2023-06-28 MED ORDER — SODIUM CHLORIDE 0.9% FLUSH
10.0000 mL | INTRAVENOUS | Status: DC | PRN
  Administered 2023-06-28: 10 mL

## 2023-06-28 MED ORDER — SODIUM CHLORIDE 0.9 % IV SOLN: Freq: Once | INTRAVENOUS | Status: AC

## 2023-06-28 NOTE — Progress Notes (Signed)
Patient is taking Braftovi as prescribed.  He has not missed any doses and reports no side effects at this time.    Patient has been examined by Dr. Ellin Saba. Vital signs and labs have been reviewed by MD - ANC, Creatinine, LFTs, hemoglobin, and platelets are within treatment parameters per M.D. - pt may proceed with treatment.  Primary RN and pharmacy notified.

## 2023-06-28 NOTE — Progress Notes (Signed)
Ok to proceed with treatment with today's labs.  No magnesium sulfate infusion for today Mag level 1.6.  T.O. Dr Carilyn Goodpasture, PharmD

## 2023-06-28 NOTE — Progress Notes (Signed)
Patients port flushed without difficulty.  Good blood return noted with no bruising or swelling noted at site.  VSS. Patient remains accessed for treatment.  

## 2023-06-28 NOTE — Patient Instructions (Signed)
 [facility]  Discharge Instructions: Thank you for choosing [facility] to provide your oncology and hematology care.  If you have a lab appointment with the Cancer Center - please note that after April 8th, 2024, all labs will be drawn in the cancer center.  You do not have to check in or register with the main entrance as you have in the past but will complete your check-in in the cancer center.  Wear comfortable clothing and clothing appropriate for easy access to any Portacath or PICC line.   We strive to give you quality time with your provider. You may need to reschedule your appointment if you arrive late (15 or more minutes).  Arriving late affects you and other patients whose appointments are after yours.  Also, if you miss three or more appointments without notifying the office, you may be dismissed from the clinic at the provider's discretion.      For prescription refill requests, have your pharmacy contact our office and allow 72 hours for refills to be completed.    Today you received the following chemotherapy and/or immunotherapy agents vectibix.        To help prevent nausea and vomiting after your treatment, we encourage you to take your nausea medication as directed.  BELOW ARE SYMPTOMS THAT SHOULD BE REPORTED IMMEDIATELY: *FEVER GREATER THAN 100.4 F (38 C) OR HIGHER *CHILLS OR SWEATING *NAUSEA AND VOMITING THAT IS NOT CONTROLLED WITH YOUR NAUSEA MEDICATION *UNUSUAL SHORTNESS OF BREATH *UNUSUAL BRUISING OR BLEEDING *URINARY PROBLEMS (pain or burning when urinating, or frequent urination) *BOWEL PROBLEMS (unusual diarrhea, constipation, pain near the anus) TENDERNESS IN MOUTH AND THROAT WITH OR WITHOUT PRESENCE OF ULCERS (sore throat, sores in mouth, or a toothache) UNUSUAL RASH, SWELLING OR PAIN  UNUSUAL VAGINAL DISCHARGE OR ITCHING   Items with * indicate a potential emergency and should be followed up as soon as possible or go to  the Emergency Department if any problems should occur.  Please show the CHEMOTHERAPY ALERT CARD or IMMUNOTHERAPY ALERT CARD at check-in to the Emergency Department and triage nurse.  Should you have questions after your visit or need to cancel or reschedule your appointment, please contact [facility] [phone]  and follow the prompts.  Office hours are 8:00 a.m. to 4:30 p.m. Monday - Friday. Please note that voicemails left after 4:00 p.m. may not be returned until the following business day.  We are closed weekends and major holidays. You have access to a nurse at all times for urgent questions. Please call the main number to the clinic [phone] and follow the prompts.  For any non-urgent questions, you may also contact your provider using MyChart. We now offer e-Visits for anyone 56 and older to request care online for non-urgent symptoms. For details visit [email or web address].   Also download the MyChart app! Go to the app store, search "MyChart", open the app, select [place], and log in with your MyChart username and password.

## 2023-06-28 NOTE — Progress Notes (Signed)
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
## 2023-06-28 NOTE — Patient Instructions (Signed)

## 2023-06-29 LAB — CEA: CEA: 1.3 ng/mL (ref 0.0–4.7)

## 2023-07-03 ENCOUNTER — Other Ambulatory Visit: Payer: Self-pay | Admitting: Family Medicine

## 2023-07-03 ENCOUNTER — Encounter: Payer: Self-pay | Admitting: Pulmonary Disease

## 2023-07-03 ENCOUNTER — Encounter: Payer: Self-pay | Admitting: Family Medicine

## 2023-07-03 ENCOUNTER — Ambulatory Visit (INDEPENDENT_AMBULATORY_CARE_PROVIDER_SITE_OTHER): Payer: Medicare Other | Admitting: Pulmonary Disease

## 2023-07-03 VITALS — BP 114/76 | HR 93 | Temp 98.2°F | Ht 68.0 in | Wt 224.8 lb

## 2023-07-03 DIAGNOSIS — R0609 Other forms of dyspnea: Secondary | ICD-10-CM | POA: Diagnosis not present

## 2023-07-03 DIAGNOSIS — E785 Hyperlipidemia, unspecified: Secondary | ICD-10-CM

## 2023-07-03 DIAGNOSIS — I10 Essential (primary) hypertension: Secondary | ICD-10-CM

## 2023-07-03 DIAGNOSIS — G4733 Obstructive sleep apnea (adult) (pediatric): Secondary | ICD-10-CM | POA: Diagnosis not present

## 2023-07-03 MED ORDER — ESZOPICLONE 2 MG PO TABS: 2.0000 mg | ORAL_TABLET | Freq: Every evening | ORAL | 3 refills | Status: DC | PRN

## 2023-07-03 MED ORDER — ALBUTEROL SULFATE HFA 108 (90 BASE) MCG/ACT IN AERS: 2.0000 | INHALATION_SPRAY | Freq: Four times a day (QID) | RESPIRATORY_TRACT | 6 refills | Status: AC | PRN

## 2023-07-03 NOTE — Telephone Encounter (Signed)
Lmtcb letter mailed ?

## 2023-07-03 NOTE — Telephone Encounter (Signed)
Dettinger NTBS last OV 06/08/22 NO RF sent to pharmacy since last OV greater than 1 year

## 2023-07-03 NOTE — Telephone Encounter (Signed)
  Prescription Request  07/03/2023  Is this a "Controlled Substance" medicine? no  Have you seen your PCP in the last 2 weeks? Appt 08/15/2023  If YES, route message to pool  -  If NO, patient needs to be scheduled for appointment.  What is the name of the medication or equipment? Pravastatin and lisinopril  Have you contacted your pharmacy to request a refill? yes   Which pharmacy would you like this sent to? [facility]   Patient notified that their request is being sent to the clinical staff for review and that they should receive a response within 2 business days.
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
## 2023-07-03 NOTE — Patient Instructions (Signed)
I will see you in about 6 months  Try and get back to using CPAP on a nightly basis  Prescription for Lunesta 2 mg will be sent to pharmacy  Refills for your albuterol will be sent to [facility]  Call us with significant concerns

## 2023-07-04 MED ORDER — PRAVASTATIN SODIUM 40 MG PO TABS
40.0000 mg | ORAL_TABLET | Freq: Every day | ORAL | 0 refills | Status: DC
Start: 2023-07-04 — End: 2023-08-15

## 2023-07-04 MED ORDER — LISINOPRIL 10 MG PO TABS
10.0000 mg | ORAL_TABLET | Freq: Every day | ORAL | 0 refills | Status: DC
Start: 2023-07-04 — End: 2023-08-15

## 2023-07-04 NOTE — Telephone Encounter (Signed)
Aware refills sent to pharmacy 

## 2023-07-10 ENCOUNTER — Ambulatory Visit: Payer: Medicare Other

## 2023-07-10 ENCOUNTER — Other Ambulatory Visit: Payer: Self-pay

## 2023-07-10 ENCOUNTER — Other Ambulatory Visit (HOSPITAL_COMMUNITY): Payer: Self-pay

## 2023-07-10 ENCOUNTER — Other Ambulatory Visit: Payer: Medicare Other

## 2023-07-10 ENCOUNTER — Ambulatory Visit: Payer: Medicare Other | Admitting: Hematology

## 2023-07-10 ENCOUNTER — Other Ambulatory Visit: Payer: Self-pay | Admitting: Hematology

## 2023-07-10 ENCOUNTER — Other Ambulatory Visit: Payer: Self-pay | Admitting: *Deleted

## 2023-07-10 DIAGNOSIS — C182 Malignant neoplasm of ascending colon: Secondary | ICD-10-CM

## 2023-07-10 MED ORDER — BRAFTOVI 75 MG PO CAPS
300.0000 mg | ORAL_CAPSULE | Freq: Every day | ORAL | 0 refills | Status: DC
  Filled 2023-07-10: qty 120, 30d supply, fill #0

## 2023-07-10 NOTE — Telephone Encounter (Signed)
Braftovi refill approved.  Patient tolerating and is to continue therapy.

## 2023-07-11 ENCOUNTER — Ambulatory Visit: Payer: Medicare Other | Admitting: Hematology

## 2023-07-11 ENCOUNTER — Other Ambulatory Visit: Payer: Medicare Other

## 2023-07-12 ENCOUNTER — Ambulatory Visit: Payer: Medicare Other

## 2023-07-16 ENCOUNTER — Other Ambulatory Visit: Payer: Self-pay | Admitting: Hematology

## 2023-07-16 ENCOUNTER — Inpatient Hospital Stay: Payer: Medicare Other

## 2023-07-16 ENCOUNTER — Inpatient Hospital Stay: Payer: Medicare Other | Attending: Hematology

## 2023-07-16 ENCOUNTER — Inpatient Hospital Stay: Payer: Medicare Other | Admitting: Hematology

## 2023-07-16 ENCOUNTER — Encounter: Payer: Self-pay | Admitting: Hematology

## 2023-07-16 VITALS — BP 118/67 | HR 90 | Temp 98.2°F | Resp 18

## 2023-07-16 DIAGNOSIS — Z79899 Other long term (current) drug therapy: Secondary | ICD-10-CM | POA: Insufficient documentation

## 2023-07-16 DIAGNOSIS — C182 Malignant neoplasm of ascending colon: Secondary | ICD-10-CM | POA: Diagnosis present

## 2023-07-16 DIAGNOSIS — Z5112 Encounter for antineoplastic immunotherapy: Secondary | ICD-10-CM | POA: Diagnosis present

## 2023-07-16 LAB — CBC WITH DIFFERENTIAL/PLATELET
Abs Immature Granulocytes: 0.09 10*3/uL — ABNORMAL HIGH (ref 0.00–0.07)
Basophils Absolute: 0.1 10*3/uL (ref 0.0–0.1)
Basophils Relative: 1 %
Eosinophils Absolute: 0.6 10*3/uL — ABNORMAL HIGH (ref 0.0–0.5)
Eosinophils Relative: 9 %
HCT: 48.4 % (ref 39.0–52.0)
Hemoglobin: 15.9 g/dL (ref 13.0–17.0)
Immature Granulocytes: 1 %
Lymphocytes Relative: 20 %
Lymphs Abs: 1.3 10*3/uL (ref 0.7–4.0)
MCH: 30.9 pg (ref 26.0–34.0)
MCHC: 32.9 g/dL (ref 30.0–36.0)
MCV: 94 fL (ref 80.0–100.0)
Monocytes Absolute: 0.6 10*3/uL (ref 0.1–1.0)
Monocytes Relative: 9 %
Neutro Abs: 4 10*3/uL (ref 1.7–7.7)
Neutrophils Relative %: 60 %
Platelets: 198 10*3/uL (ref 150–400)
RBC: 5.15 MIL/uL (ref 4.22–5.81)
RDW: 13.1 % (ref 11.5–15.5)
WBC: 6.7 10*3/uL (ref 4.0–10.5)
nRBC: 0 % (ref 0.0–0.2)

## 2023-07-16 LAB — COMPREHENSIVE METABOLIC PANEL
ALT: 16 U/L (ref 0–44)
AST: 16 U/L (ref 15–41)
Albumin: 3.7 g/dL (ref 3.5–5.0)
Alkaline Phosphatase: 90 U/L (ref 38–126)
Anion gap: 7 (ref 5–15)
BUN: 9 mg/dL (ref 8–23)
CO2: 21 mmol/L — ABNORMAL LOW (ref 22–32)
Calcium: 8.5 mg/dL — ABNORMAL LOW (ref 8.9–10.3)
Chloride: 107 mmol/L (ref 98–111)
Creatinine, Ser: 0.75 mg/dL (ref 0.61–1.24)
GFR, Estimated: >60 mL/min (ref >60)
Glucose, Bld: 98 mg/dL (ref 70–99)
Potassium: 4.5 mmol/L (ref 3.5–5.1)
Sodium: 135 mmol/L (ref 135–145)
Total Bilirubin: 0.5 mg/dL (ref 0.3–1.2)
Total Protein: 7.1 g/dL (ref 6.5–8.1)

## 2023-07-16 LAB — MAGNESIUM: Magnesium: 1.6 mg/dL — ABNORMAL LOW (ref 1.7–2.4)

## 2023-07-16 MED ORDER — SODIUM CHLORIDE 0.9% FLUSH
10.0000 mL | INTRAVENOUS | Status: DC | PRN
  Administered 2023-07-16: 10 mL

## 2023-07-16 MED ORDER — SODIUM CHLORIDE 0.9 % IV SOLN
6.0000 mg/kg | Freq: Once | INTRAVENOUS | Status: AC
  Administered 2023-07-16: 600 mg via INTRAVENOUS
  Filled 2023-07-16: qty 20

## 2023-07-16 MED ORDER — HEPARIN SOD (PORK) LOCK FLUSH 100 UNIT/ML IV SOLN
500.0000 [IU] | Freq: Once | INTRAVENOUS | Status: AC | PRN
  Administered 2023-07-16: 500 [IU]

## 2023-07-16 MED ORDER — SODIUM CHLORIDE 0.9 % IV SOLN: Freq: Once | INTRAVENOUS | Status: AC

## 2023-07-16 MED ORDER — SODIUM CHLORIDE 0.9% FLUSH
10.0000 mL | INTRAVENOUS | Status: AC
  Administered 2023-07-16: 10 mL

## 2023-07-16 NOTE — Progress Notes (Signed)
Patient presents today for Vectibix infusion.  Patient is in satisfactory condition with no new complaints voiced.  Vital signs are stable.  Labs reviewed and all labs are within treatment parameters.  We will proceed with treatment per MD orders.    Patient's magnesium noted to be 1.6 today, which was the same as patient's last treatment on 06/28/23 and Dr.K stated no magnesium was needed. Message sent to A.Duke Salvia, LPN and Dr.K no orders were received.   Treatment given today per MD orders. Tolerated infusion without adverse affects. Vital signs stable. No complaints at this time. Discharged from clinic ambulatory in stable condition. Alert and oriented x 3. F/U with [facility] as scheduled.

## 2023-07-16 NOTE — Patient Instructions (Signed)
[facility]  Discharge Instructions: Thank you for choosing [facility] to provide your oncology and hematology care.  If you have a lab appointment with the Cancer Center - please note that after April 8th, 2024, all labs will be drawn in the cancer center.  You do not have to check in or register with the main entrance as you have in the past but will complete your check-in in the cancer center.  Wear comfortable clothing and clothing appropriate for easy access to any Portacath or PICC line.   We strive to give you quality time with your provider. You may need to reschedule your appointment if you arrive late (15 or more minutes).  Arriving late affects you and other patients whose appointments are after yours.  Also, if you miss three or more appointments without notifying the office, you may be dismissed from the clinic at the provider's discretion.      For prescription refill requests, have your pharmacy contact our office and allow 72 hours for refills to be completed.    Today you received the following chemotherapy and/or immunotherapy agents Vectibix   To help prevent nausea and vomiting after your treatment, we encourage you to take your nausea medication as directed.  Panitumumab Injection What is this medication? PANITUMUMAB (pan i TOOM ue mab) treats colorectal cancer. It works by blocking a protein that causes cancer cells to grow and multiply. This helps to slow or stop the spread of cancer cells. It is a monoclonal antibody. This medicine may be used for other purposes; ask your health care provider or pharmacist if you have questions. COMMON BRAND NAME(S): Vectibix What should I tell my care team before I take this medication? They need to know if you have any of these conditions: Eye disease Low levels of magnesium in the blood Lung disease An unusual or allergic reaction to panitumumab, other medications, foods, dyes, or  preservatives Pregnant or trying to get pregnant Breast-feeding How should I use this medication? This medication is injected into a vein. It is given by your care team in a hospital or clinic setting. Talk to your care team about the use of this medication in children. Special care may be needed. Overdosage: If you think you have taken too much of this medicine contact a poison control center or emergency room at once. NOTE: This medicine is only for you. Do not share this medicine with others. What if I miss a dose? Keep appointments for follow-up doses. It is important not to miss your dose. Call your care team if you are unable to keep an appointment. What may interact with this medication? Bevacizumab This list may not describe all possible interactions. Give your health care provider a list of all the medicines, herbs, non-prescription drugs, or dietary supplements you use. Also tell them if you smoke, drink alcohol, or use illegal drugs. Some items may interact with your medicine. What should I watch for while using this medication? Your condition will be monitored carefully while you are receiving this medication. This medication may make you feel generally unwell. This is not uncommon as chemotherapy can affect healthy cells as well as cancer cells. Report any side effects. Continue your course of treatment even though you feel ill unless your care team tells you to stop. This medication can make you more sensitive to the sun. Keep out of the sun while receiving this medication and for 2 months after stopping therapy. If you  cannot avoid being in the sun, wear protective clothing and sunscreen. Do not use sun lamps, tanning beds, or tanning booths. Check with your care team if you have severe diarrhea, nausea, and vomiting or if you sweat a lot. The loss of too much body fluid may make it dangerous for you to take this medication. This medication may cause serious skin reactions. They can  happen weeks to months after starting the medication. Contact your care team right away if you notice fevers or flu-like symptoms with a rash. The rash may be red or purple and then turn into blisters or peeling of the skin. You may also notice a red rash with swelling of the face, lips, or lymph nodes in your neck or under your arms. Talk to your care team if you may be pregnant. Serious birth defects can occur if you take this medication during pregnancy and for 2 months after the last dose. Contraception is recommended while taking this medication and for 2 months after the last dose. Your care team can help you find the option that works for you. Do not breastfeed while taking this medication and for 2 months after the last dose. This medication may cause infertility. Talk to your care team if you are concerned about your fertility. What side effects may I notice from receiving this medication? Side effects that you should report to your care team as soon as possible: Allergic reactions--skin rash, itching, hives, swelling of the face, lips, tongue, or throat Dry cough, shortness of breath or trouble breathing Eye pain, redness, irritation, or discharge with blurry or decreased vision Infusion reactions--chest pain, shortness of breath or trouble breathing, feeling faint or lightheaded Low magnesium level--muscle pain or cramps, unusual weakness or fatigue, fast or irregular heartbeat, tremors Low potassium level--muscle pain or cramps, unusual weakness or fatigue, fast or irregular heartbeat, constipation Redness, blistering, peeling, or loosening of the skin, including inside the mouth Skin reactions on sun-exposed areas Side effects that usually do not require medical attention (report to your care team if they continue or are bothersome): Change in nail shape, thickness, or color Diarrhea Dry skin Fatigue Nausea Vomiting This list may not describe all possible side effects. Call your  doctor for medical advice about side effects. You may report side effects to FDA at 1-800-FDA-1088. Where should I keep my medication? This medication is given in a hospital or clinic. It will not be stored at home. NOTE: This sheet is a summary. It may not cover all possible information. If you have questions about this medicine, talk to your doctor, pharmacist, or health care provider.  2024 Elsevier/Gold Standard (2022-03-01 00:00:00)   BELOW ARE SYMPTOMS THAT SHOULD BE REPORTED IMMEDIATELY: *FEVER GREATER THAN 100.4 F (38 C) OR HIGHER *CHILLS OR SWEATING *NAUSEA AND VOMITING THAT IS NOT CONTROLLED WITH YOUR NAUSEA MEDICATION *UNUSUAL SHORTNESS OF BREATH *UNUSUAL BRUISING OR BLEEDING *URINARY PROBLEMS (pain or burning when urinating, or frequent urination) *BOWEL PROBLEMS (unusual diarrhea, constipation, pain near the anus) TENDERNESS IN MOUTH AND THROAT WITH OR WITHOUT PRESENCE OF ULCERS (sore throat, sores in mouth, or a toothache) UNUSUAL RASH, SWELLING OR PAIN  UNUSUAL VAGINAL DISCHARGE OR ITCHING   Items with * indicate a potential emergency and should be followed up as soon as possible or go to the Emergency Department if any problems should occur.  Please show the CHEMOTHERAPY ALERT CARD or IMMUNOTHERAPY ALERT CARD at check-in to the Emergency Department and triage nurse.  Should you have questions after  your visit or need to cancel or reschedule your appointment, please contact [facility] [phone]  and follow the prompts.  Office hours are 8:00 a.m. to 4:30 p.m. Monday - Friday. Please note that voicemails left after 4:00 p.m. may not be returned until the following business day.  We are closed weekends and major holidays. You have access to a nurse at all times for urgent questions. Please call the main number to the clinic [phone] and follow the prompts.  For any non-urgent questions, you may also contact your provider using MyChart. We now  offer e-Visits for anyone 82 and older to request care online for non-urgent symptoms. For details visit [email or web address].   Also download the MyChart app! Go to the app store, search "MyChart", open the app, select [place], and log in with your MyChart username and password.

## 2023-07-17 ENCOUNTER — Encounter: Payer: Self-pay | Admitting: Hematology

## 2023-07-17 ENCOUNTER — Other Ambulatory Visit: Payer: Self-pay

## 2023-07-17 LAB — CEA: CEA: 1.5 ng/mL (ref 0.0–4.7)

## 2023-07-23 ENCOUNTER — Encounter: Payer: Self-pay | Admitting: Hematology

## 2023-07-30 ENCOUNTER — Inpatient Hospital Stay: Payer: Medicare Other

## 2023-08-10 ENCOUNTER — Encounter: Payer: Self-pay | Admitting: Hematology

## 2023-08-13 ENCOUNTER — Encounter: Payer: Self-pay | Admitting: Hematology

## 2023-08-13 ENCOUNTER — Inpatient Hospital Stay: Payer: No Typology Code available for payment source | Attending: Hematology

## 2023-08-13 ENCOUNTER — Other Ambulatory Visit: Payer: Self-pay

## 2023-08-13 ENCOUNTER — Inpatient Hospital Stay: Payer: No Typology Code available for payment source

## 2023-08-13 VITALS — BP 126/82 | HR 90 | Temp 97.6°F | Resp 18 | Wt 225.8 lb

## 2023-08-13 DIAGNOSIS — Z8261 Family history of arthritis: Secondary | ICD-10-CM | POA: Diagnosis not present

## 2023-08-13 DIAGNOSIS — R197 Diarrhea, unspecified: Secondary | ICD-10-CM | POA: Diagnosis not present

## 2023-08-13 DIAGNOSIS — Z888 Allergy status to other drugs, medicaments and biological substances status: Secondary | ICD-10-CM | POA: Insufficient documentation

## 2023-08-13 DIAGNOSIS — Z880 Allergy status to penicillin: Secondary | ICD-10-CM | POA: Diagnosis not present

## 2023-08-13 DIAGNOSIS — Z8269 Family history of other diseases of the musculoskeletal system and connective tissue: Secondary | ICD-10-CM | POA: Insufficient documentation

## 2023-08-13 DIAGNOSIS — Z9049 Acquired absence of other specified parts of digestive tract: Secondary | ICD-10-CM | POA: Insufficient documentation

## 2023-08-13 DIAGNOSIS — K429 Umbilical hernia without obstruction or gangrene: Secondary | ICD-10-CM | POA: Diagnosis not present

## 2023-08-13 DIAGNOSIS — Z5112 Encounter for antineoplastic immunotherapy: Secondary | ICD-10-CM | POA: Diagnosis not present

## 2023-08-13 DIAGNOSIS — Z8249 Family history of ischemic heart disease and other diseases of the circulatory system: Secondary | ICD-10-CM | POA: Diagnosis not present

## 2023-08-13 DIAGNOSIS — G4730 Sleep apnea, unspecified: Secondary | ICD-10-CM | POA: Diagnosis not present

## 2023-08-13 DIAGNOSIS — Z882 Allergy status to sulfonamides status: Secondary | ICD-10-CM | POA: Insufficient documentation

## 2023-08-13 DIAGNOSIS — C182 Malignant neoplasm of ascending colon: Secondary | ICD-10-CM | POA: Insufficient documentation

## 2023-08-13 DIAGNOSIS — G629 Polyneuropathy, unspecified: Secondary | ICD-10-CM | POA: Insufficient documentation

## 2023-08-13 DIAGNOSIS — N433 Hydrocele, unspecified: Secondary | ICD-10-CM | POA: Diagnosis not present

## 2023-08-13 DIAGNOSIS — E785 Hyperlipidemia, unspecified: Secondary | ICD-10-CM | POA: Diagnosis not present

## 2023-08-13 DIAGNOSIS — Z5986 Financial insecurity: Secondary | ICD-10-CM | POA: Insufficient documentation

## 2023-08-13 DIAGNOSIS — I10 Essential (primary) hypertension: Secondary | ICD-10-CM | POA: Insufficient documentation

## 2023-08-13 DIAGNOSIS — C786 Secondary malignant neoplasm of retroperitoneum and peritoneum: Secondary | ICD-10-CM | POA: Diagnosis not present

## 2023-08-13 DIAGNOSIS — R21 Rash and other nonspecific skin eruption: Secondary | ICD-10-CM | POA: Diagnosis not present

## 2023-08-13 DIAGNOSIS — N5089 Other specified disorders of the male genital organs: Secondary | ICD-10-CM

## 2023-08-13 DIAGNOSIS — Z803 Family history of malignant neoplasm of breast: Secondary | ICD-10-CM | POA: Diagnosis not present

## 2023-08-13 DIAGNOSIS — Z79899 Other long term (current) drug therapy: Secondary | ICD-10-CM | POA: Insufficient documentation

## 2023-08-13 LAB — CBC WITH DIFFERENTIAL/PLATELET
Abs Immature Granulocytes: 0.04 10*3/uL (ref 0.00–0.07)
Basophils Absolute: 0.1 10*3/uL (ref 0.0–0.1)
Basophils Relative: 1 %
Eosinophils Absolute: 0.6 10*3/uL — ABNORMAL HIGH (ref 0.0–0.5)
Eosinophils Relative: 10 %
HCT: 47.2 % (ref 39.0–52.0)
Hemoglobin: 15.8 g/dL (ref 13.0–17.0)
Immature Granulocytes: 1 %
Lymphocytes Relative: 26 %
Lymphs Abs: 1.5 10*3/uL (ref 0.7–4.0)
MCH: 31 pg (ref 26.0–34.0)
MCHC: 33.5 g/dL (ref 30.0–36.0)
MCV: 92.5 fL (ref 80.0–100.0)
Monocytes Absolute: 0.5 10*3/uL (ref 0.1–1.0)
Monocytes Relative: 9 %
Neutro Abs: 3 10*3/uL (ref 1.7–7.7)
Neutrophils Relative %: 53 %
Platelets: 208 10*3/uL (ref 150–400)
RBC: 5.1 MIL/uL (ref 4.22–5.81)
RDW: 13.3 % (ref 11.5–15.5)
WBC: 5.6 10*3/uL (ref 4.0–10.5)
nRBC: 0 % (ref 0.0–0.2)

## 2023-08-13 LAB — COMPREHENSIVE METABOLIC PANEL
ALT: 15 U/L (ref 0–44)
AST: 17 U/L (ref 15–41)
Albumin: 3.7 g/dL (ref 3.5–5.0)
Alkaline Phosphatase: 93 U/L (ref 38–126)
Anion gap: 7 (ref 5–15)
BUN: 9 mg/dL (ref 8–23)
CO2: 22 mmol/L (ref 22–32)
Calcium: 8.8 mg/dL — ABNORMAL LOW (ref 8.9–10.3)
Chloride: 106 mmol/L (ref 98–111)
Creatinine, Ser: 0.75 mg/dL (ref 0.61–1.24)
GFR, Estimated: >60 mL/min (ref >60)
Glucose, Bld: 111 mg/dL — ABNORMAL HIGH (ref 70–99)
Potassium: 4.2 mmol/L (ref 3.5–5.1)
Sodium: 135 mmol/L (ref 135–145)
Total Bilirubin: 0.6 mg/dL (ref 0.3–1.2)
Total Protein: 7.2 g/dL (ref 6.5–8.1)

## 2023-08-13 LAB — MAGNESIUM: Magnesium: 1.6 mg/dL — ABNORMAL LOW (ref 1.7–2.4)

## 2023-08-13 MED ORDER — SODIUM CHLORIDE 0.9 % IV SOLN
6.0000 mg/kg | Freq: Once | INTRAVENOUS | Status: AC
  Administered 2023-08-13: 600 mg via INTRAVENOUS
  Filled 2023-08-13: qty 20

## 2023-08-13 MED ORDER — HEPARIN SOD (PORK) LOCK FLUSH 100 UNIT/ML IV SOLN
500.0000 [IU] | Freq: Once | INTRAVENOUS | Status: AC | PRN
  Administered 2023-08-13: 500 [IU]

## 2023-08-13 MED ORDER — SODIUM CHLORIDE 0.9 % IV SOLN: Freq: Once | INTRAVENOUS | Status: AC

## 2023-08-13 MED ORDER — SODIUM CHLORIDE 0.9% FLUSH
10.0000 mL | INTRAVENOUS | Status: DC | PRN
  Administered 2023-08-13: 10 mL

## 2023-08-13 MED ORDER — SODIUM CHLORIDE 0.9% FLUSH
10.0000 mL | Freq: Once | INTRAVENOUS | Status: AC
  Administered 2023-08-13: 10 mL via INTRAVENOUS

## 2023-08-13 MED ORDER — MAGNESIUM SULFATE 2 GM/50ML IV SOLN
2.0000 g | Freq: Once | INTRAVENOUS | Status: AC
  Administered 2023-08-13: 2 g via INTRAVENOUS
  Filled 2023-08-13: qty 50

## 2023-08-13 NOTE — Addendum Note (Signed)
Addended by: Dicky Doe D on: 08/13/2023 02:35 PM   Modules accepted: Orders

## 2023-08-13 NOTE — Progress Notes (Signed)
Treatment given today per MD orders.  Tolerated infusion without adverse affects.  Vital signs stable.  No complaints at this time.  Discharge from clinic ambulatory in stable condition.  Alert and oriented X 3.  Follow up with [facility] as scheduled.

## 2023-08-13 NOTE — Patient Instructions (Signed)
[facility]  Discharge Instructions: Thank you for choosing [facility] to provide your oncology and hematology care.  If you have a lab appointment with the Cancer Center - please note that after April 8th, 2024, all labs will be drawn in the cancer center.  You do not have to check in or register with the main entrance as you have in the past but will complete your check-in in the cancer center.  Wear comfortable clothing and clothing appropriate for easy access to any Portacath or PICC line.   We strive to give you quality time with your provider. You may need to reschedule your appointment if you arrive late (15 or more minutes).  Arriving late affects you and other patients whose appointments are after yours.  Also, if you miss three or more appointments without notifying the office, you may be dismissed from the clinic at the provider's discretion.      For prescription refill requests, have your pharmacy contact our office and allow 72 hours for refills to be completed.    Today you received the following chemotherapy and/or immunotherapy agents vectibix      To help prevent nausea and vomiting after your treatment, we encourage you to take your nausea medication as directed.  BELOW ARE SYMPTOMS THAT SHOULD BE REPORTED IMMEDIATELY: *FEVER GREATER THAN 100.4 F (38 C) OR HIGHER *CHILLS OR SWEATING *NAUSEA AND VOMITING THAT IS NOT CONTROLLED WITH YOUR NAUSEA MEDICATION *UNUSUAL SHORTNESS OF BREATH *UNUSUAL BRUISING OR BLEEDING *URINARY PROBLEMS (pain or burning when urinating, or frequent urination) *BOWEL PROBLEMS (unusual diarrhea, constipation, pain near the anus) TENDERNESS IN MOUTH AND THROAT WITH OR WITHOUT PRESENCE OF ULCERS (sore throat, sores in mouth, or a toothache) UNUSUAL RASH, SWELLING OR PAIN  UNUSUAL VAGINAL DISCHARGE OR ITCHING   Items with * indicate a potential emergency and should be followed up as soon as possible or go to the  Emergency Department if any problems should occur.  Please show the CHEMOTHERAPY ALERT CARD or IMMUNOTHERAPY ALERT CARD at check-in to the Emergency Department and triage nurse.  Should you have questions after your visit or need to cancel or reschedule your appointment, please contact [facility] [phone]  and follow the prompts.  Office hours are 8:00 a.m. to 4:30 p.m. Monday - Friday. Please note that voicemails left after 4:00 p.m. may not be returned until the following business day.  We are closed weekends and major holidays. You have access to a nurse at all times for urgent questions. Please call the main number to the clinic [phone] and follow the prompts.  For any non-urgent questions, you may also contact your provider using MyChart. We now offer e-Visits for anyone 1 and older to request care online for non-urgent symptoms. For details visit [email or web address].   Also download the MyChart app! Go to the app store, search "MyChart", open the app, select [place], and log in with your MyChart username and password.

## 2023-08-13 NOTE — Addendum Note (Signed)
Addended by: Pryor Ochoa E on: 08/13/2023 02:12 PM   Modules accepted: Orders

## 2023-08-13 NOTE — Progress Notes (Signed)
Patient presents today for treatment. Patient has complaints of a swollen testicle. Dr. Ellin Saba informed by RN and U/S scheduled for patient.

## 2023-08-13 NOTE — Progress Notes (Signed)
Schedule printed with U/S appointment and given to patient. Understanding verbalized.

## 2023-08-13 NOTE — Progress Notes (Signed)
Patient presents today for Vectibix and 2 grams Magnesium Sulfate due to Magnesium 1.6 today. Message received from Westley Hummer RN / Dr. Theo Dills to proceed with treatment. Labs within parameters for treatment. Vital signs within parameters for treatment.

## 2023-08-15 ENCOUNTER — Encounter: Payer: Self-pay | Admitting: Family Medicine

## 2023-08-15 ENCOUNTER — Ambulatory Visit: Payer: No Typology Code available for payment source | Admitting: Family Medicine

## 2023-08-15 VITALS — BP 114/76 | HR 94 | Ht 68.0 in | Wt 224.0 lb

## 2023-08-15 DIAGNOSIS — E785 Hyperlipidemia, unspecified: Secondary | ICD-10-CM | POA: Diagnosis not present

## 2023-08-15 DIAGNOSIS — Z23 Encounter for immunization: Secondary | ICD-10-CM | POA: Diagnosis not present

## 2023-08-15 DIAGNOSIS — C182 Malignant neoplasm of ascending colon: Secondary | ICD-10-CM | POA: Diagnosis not present

## 2023-08-15 DIAGNOSIS — I10 Essential (primary) hypertension: Secondary | ICD-10-CM | POA: Diagnosis not present

## 2023-08-15 DIAGNOSIS — K640 First degree hemorrhoids: Secondary | ICD-10-CM | POA: Diagnosis not present

## 2023-08-15 DIAGNOSIS — N529 Male erectile dysfunction, unspecified: Secondary | ICD-10-CM

## 2023-08-15 MED ORDER — LISINOPRIL 10 MG PO TABS
10.0000 mg | ORAL_TABLET | Freq: Every day | ORAL | 3 refills | Status: DC
Start: 2023-08-15 — End: 2023-10-08

## 2023-08-15 MED ORDER — TADALAFIL 20 MG PO TABS
20.0000 mg | ORAL_TABLET | Freq: Every day | ORAL | 2 refills | Status: AC | PRN
Start: 2023-08-15 — End: ?

## 2023-08-15 MED ORDER — PRAVASTATIN SODIUM 40 MG PO TABS
40.0000 mg | ORAL_TABLET | Freq: Every day | ORAL | 3 refills | Status: DC
Start: 2023-08-15 — End: 2023-10-01

## 2023-08-15 NOTE — Progress Notes (Signed)
BP 114/76   Pulse 94   Ht 5\' 8"  (1.727 m)   Wt 224 lb (101.6 kg)   SpO2 100%   BMI 34.06 kg/m    Subjective:   Patient ID: Carlos Miranda, male    DOB: 09-28-1961, 62 y.o.   MRN: [id number]  HPI: Carlos Miranda is a 62 y.o. male presenting on 08/15/2023 for Medical Management of Chronic Issues, Hyperlipidemia, and Hypertension   HPI Hypertension Patient is currently on lisinopril, and their blood pressure today is 114/76. Patient denies any lightheadedness or dizziness. Patient denies headaches, blurred vision, chest pains, shortness of breath, or weakness. Denies any side effects from medication and is content with current medication.   Hyperlipidemia Patient is coming in for recheck of his hyperlipidemia. The patient is currently taking pravastatin. They deny any issues with myalgias or history of liver damage from it. They deny any focal numbness or weakness or chest pain.   Colon cancer Patient has colon cancer and follows up with oncology  Patient has been finding some on and off hemorrhoids and a little spotting or bleeding around his stool or sometimes when he wipes.  He says he is using Tucks pads and will go away and just wanted to get it double checked today.  Right scrotal swelling Patient has new right scrotum swelling and enlargement and fullness.  He said he did tell it to his oncologist and they already have him scheduled for an ultrasound this Friday.  He is going to go ahead and get that.  He denies any pain or urinary issues with it.  Erectile dysfunction Patient has been using Cialis for erectile dysfunction and it does help some but it is not helping completely.  He says he can get an erection but just cannot maintain.   Relevant past medical, surgical, family and social history reviewed and updated as indicated. Interim medical history since our last visit reviewed. Allergies and medications reviewed and updated.  Review of Systems  Constitutional:   Negative for chills and fever.  Eyes:  Negative for visual disturbance.  Respiratory:  Negative for shortness of breath and wheezing.   Cardiovascular:  Negative for chest pain and leg swelling.  Genitourinary:  Positive for scrotal swelling.  Musculoskeletal:  Negative for back pain and gait problem.  Skin:  Negative for rash.  Neurological:  Negative for dizziness, weakness and light-headedness.  All other systems reviewed and are negative.   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 If you have any questions, ask your nurse or doctor.          albuterol 108 (90 Base) MCG/ACT inhaler Commonly known as: VENTOLIN HFA Inhale into the lungs.   albuterol 108 (90 Base) MCG/ACT inhaler Commonly known as: VENTOLIN HFA Inhale 2 puffs into the lungs every 6 (six) hours as needed for wheezing or shortness of breath.   Braftovi 75 MG capsule  Generic drug: encorafenib Take 4 capsules (300 mg total) by mouth daily.   clindamycin 1 % gel Commonly known as: Clindagel Apply topically 2 (two) times daily.   doxycycline 100 MG tablet Commonly known as: VIBRA-TABS TAKE 1 TABLET BY MOUTH TWICE DAILY AS NEEDED FOR ACNEFORM RASH   eszopiclone 2 MG Tabs tablet Commonly known as: LUNESTA Take 1 tablet (2 mg total) by mouth at bedtime as needed for sleep. Take immediately before bedtime   fluticasone 50 MCG/ACT nasal spray Commonly known as: FLONASE SPRAY 1 SPRAY INTO BOTH NOSTRILS DAILY.    ibuprofen 600 MG tablet Commonly known as: ADVIL Take 1 tablet (600 mg total) by mouth every 8 (eight) hours as needed.   LamoTRIgine 300 MG Tb24 24 hour tablet Take 1 tablet every night   lisinopril 10 MG tablet Commonly known as: ZESTRIL Take 1 tablet (10 mg total) by mouth daily.   magnesium oxide 400 (240 Mg) MG tablet Commonly known as: MAG-OX Take 2 tablets (800 mg total) by mouth in the morning, at noon, and at bedtime.   Oxtellar XR 600 MG Tb24 Generic drug: OXcarbazepine ER TAKE ONE TABLET EVERY EVENING   pravastatin 40 MG tablet Commonly known as: PRAVACHOL Take 1 tablet (40 mg total) by mouth daily.   prochlorperazine 10 MG tablet Commonly known as: COMPAZINE Take 1 tablet (10 mg total) by mouth every 6 (six) hours as needed for nausea or vomiting.   tadalafil 20 MG tablet Commonly known as: CIALIS Take 1 tablet (20 mg total) by mouth daily as needed for erectile dysfunction. What changed:  medication strength how much to take when to take this Changed by: Elige Radon Chaneka Trefz   triamcinolone 0.025%-Cerave equivalent 1:1 cream mixture Apply topically 2 (two) times daily.   Xcopri 100 MG Tabs Generic drug: Cenobamate TAKE ONE TABLET BY MOUTH ONCE EVERY NIGHT         Objective:   BP 114/76   Pulse 94   Ht 5\' 8"  (1.727 m)   Wt 224 lb (101.6 kg)   SpO2 100%   BMI 34.06 kg/m   Wt Readings from Last 3 Encounters:  08/15/23 224 lb (101.6 kg)  08/13/23 225 lb 12.8 oz (102.4 kg)  07/16/23 225 lb 12.8 oz (102.4 kg)    Physical Exam Vitals and nursing note reviewed.  Constitutional:      General: He is not in acute distress.    Appearance: He is well-developed. He is not diaphoretic.  Eyes:     General: No scleral icterus.    Conjunctiva/sclera: Conjunctivae normal.  Neck:     Thyroid: No thyromegaly.  Cardiovascular:     Rate and Rhythm: Normal rate and regular rhythm.     Heart sounds: Normal heart sounds. No murmur heard. Pulmonary:      Effort: Pulmonary effort is normal. No respiratory distress.     Breath sounds: Normal breath sounds. No wheezing.  Genitourinary:    Testes:        Right: Mass present. Tenderness or swelling not present. Right testis is descended. Cremasteric reflex is present.         Left: Mass, tenderness or swelling not present. Left testis is descended. Cremasteric reflex is present.      Rectum: External hemorrhoid (Very small external hemorrhoid) present.  Musculoskeletal:        General: No swelling. Normal range of motion.     Cervical back: Neck supple.  Lymphadenopathy:     Cervical: No cervical adenopathy.  Skin:    General: Skin is warm and dry.     Findings: No rash.  Neurological:     Mental Status: He is alert and oriented to person, place, and time.     Coordination: Coordination normal.  Psychiatric:        Behavior: Behavior normal.     Results for orders placed or performed in visit on 08/13/23  CBC with Differential/Platelet  Result Value Ref Range   WBC 5.6 4.0 - 10.5 K/uL   RBC 5.10 4.22 - 5.81 MIL/uL   Hemoglobin 15.8 13.0 - 17.0 g/dL   HCT 40.9 81.1 - 91.4 %   MCV 92.5 80.0 - 100.0 fL   MCH 31.0 26.0 - 34.0 pg   MCHC 33.5 30.0 - 36.0 g/dL   RDW 78.2 95.6 - 21.3 %   Platelets 208 150 - 400 K/uL   nRBC 0.0 0.0 - 0.2 %   Neutrophils Relative % 53 %   Neutro Abs 3.0 1.7 - 7.7 K/uL   Lymphocytes Relative 26 %   Lymphs Abs 1.5 0.7 - 4.0 K/uL   Monocytes Relative 9 %   Monocytes Absolute 0.5 0.1 - 1.0 K/uL   Eosinophils Relative 10 %   Eosinophils Absolute 0.6 (H) 0.0 - 0.5 K/uL   Basophils Relative 1 %   Basophils Absolute 0.1 0.0 - 0.1 K/uL   Immature Granulocytes 1 %   Abs Immature Granulocytes 0.04 0.00 - 0.07 K/uL  Comprehensive metabolic panel  Result Value Ref Range   Sodium 135 135 - 145 mmol/L   Potassium 4.2 3.5 - 5.1 mmol/L   Chloride 106 98 - 111 mmol/L   CO2 22 22 - 32 mmol/L   Glucose, Bld 111 (H) 70 - 99 mg/dL   BUN 9 8 - 23 mg/dL    Creatinine, Ser 0.86 0.61 - 1.24 mg/dL   Calcium 8.8 (L) 8.9 - 10.3 mg/dL   Total Protein 7.2 6.5 - 8.1 g/dL   Albumin 3.7 3.5 - 5.0 g/dL   AST 17 15 - 41 U/L   ALT 15 0 - 44 U/L   Alkaline Phosphatase 93 38 - 126 U/L   Total Bilirubin 0.6 0.3 - 1.2 mg/dL   GFR, Estimated >57 >84 mL/min   Anion gap 7 5 - 15  Magnesium  Result Value Ref Range   Magnesium 1.6 (L) 1.7 - 2.4 mg/dL    Assessment & Plan:   Problem List Items Addressed This Visit       Cardiovascular and Mediastinum   HTN (hypertension) - Primary   Relevant Medications   lisinopril (ZESTRIL) 10 MG tablet   pravastatin (PRAVACHOL) 40 MG tablet   tadalafil (CIALIS) 20 MG tablet     Digestive   Colon cancer, ascending (HCC)     Other   HLD (hyperlipidemia)   Relevant Medications   lisinopril (ZESTRIL) 10 MG tablet   pravastatin (PRAVACHOL) 40 MG tablet   tadalafil (CIALIS) 20 MG tablet   Other Relevant Orders   Lipid panel   Other Visit Diagnoses     Essential hypertension       Relevant Medications   lisinopril (ZESTRIL) 10 MG tablet   pravastatin (PRAVACHOL) 40 MG tablet   tadalafil (CIALIS) 20 MG tablet   Erectile dysfunction, unspecified erectile dysfunction type       Relevant Medications   tadalafil (CIALIS) 20 MG tablet   Grade I hemorrhoids       Relevant Medications   lisinopril (ZESTRIL) 10 MG  tablet   pravastatin (PRAVACHOL) 40 MG tablet   tadalafil (CIALIS) 20 MG tablet       Blood work looks mostly looks good from oncology and they are mostly managing.  I added on a cholesterol panel so they can draw it with her next blood draw. Follow up plan: Return in about 6 months (around 02/13/2024), or if symptoms worsen or fail to improve, for Hyperlipidemia recheck.  Counseling provided for all of the vaccine components Orders Placed This Encounter  Procedures   Lipid panel    Arville Care, MD [facility] 08/15/2023, 12:07 PM

## 2023-08-16 ENCOUNTER — Ambulatory Visit (HOSPITAL_COMMUNITY)
Admission: RE | Admit: 2023-08-16 | Discharge: 2023-08-16 | Disposition: A | Discharge status: Home / Self Care | Payer: No Typology Code available for payment source | Source: Ambulatory Visit | Attending: Hematology | Admitting: Hematology

## 2023-08-16 DIAGNOSIS — C182 Malignant neoplasm of ascending colon: Secondary | ICD-10-CM | POA: Diagnosis not present

## 2023-08-16 DIAGNOSIS — R918 Other nonspecific abnormal finding of lung field: Secondary | ICD-10-CM | POA: Diagnosis not present

## 2023-08-16 MED ORDER — FLUDEOXYGLUCOSE F - 18 (FDG) INJECTION
11.9000 | Freq: Once | INTRAVENOUS | Status: AC | PRN
  Administered 2023-08-16: 11.9 via INTRAVENOUS

## 2023-08-17 ENCOUNTER — Other Ambulatory Visit: Payer: Self-pay

## 2023-08-17 ENCOUNTER — Other Ambulatory Visit: Payer: Self-pay | Admitting: Neurology

## 2023-08-17 ENCOUNTER — Ambulatory Visit (HOSPITAL_COMMUNITY)
Admission: RE | Admit: 2023-08-17 | Discharge: 2023-08-17 | Disposition: A | Discharge status: Home / Self Care | Payer: No Typology Code available for payment source | Source: Ambulatory Visit | Attending: Hematology | Admitting: Hematology

## 2023-08-17 DIAGNOSIS — N5089 Other specified disorders of the male genital organs: Secondary | ICD-10-CM | POA: Diagnosis not present

## 2023-08-17 DIAGNOSIS — N50811 Right testicular pain: Secondary | ICD-10-CM | POA: Diagnosis not present

## 2023-08-17 DIAGNOSIS — N433 Hydrocele, unspecified: Secondary | ICD-10-CM | POA: Diagnosis not present

## 2023-08-17 MED ORDER — XCOPRI 100 MG PO TABS: ORAL_TABLET | ORAL | 3 refills | Status: DC

## 2023-08-17 NOTE — Telephone Encounter (Signed)
Patient is needing a refill on Cenobamate 100mg  , please send to [facility] in [place]

## 2023-08-20 ENCOUNTER — Telehealth: Payer: Self-pay | Admitting: Neurology

## 2023-08-20 ENCOUNTER — Encounter: Payer: Self-pay | Admitting: Hematology

## 2023-08-20 NOTE — Telephone Encounter (Signed)
Refill was sent in on 08/17/23 with 3 refills pt called informed to call the pharmacy

## 2023-08-20 NOTE — Telephone Encounter (Signed)
1. Which medications need to be refilled? (please list name of each medication and dose if known) Cenobamate (XCOPRI) 100 MG TABS [[id number]]   2. Which pharmacy/location (including street and city if local pharmacy) is medication to be sent to? [facility]  [address] [place], [place] [address]  3. Do they need a 30 day or 90 day supply?   Caller stated he needs script for new pharmacy. Only has 8 pills left

## 2023-08-22 ENCOUNTER — Other Ambulatory Visit: Payer: Self-pay

## 2023-08-22 ENCOUNTER — Other Ambulatory Visit: Payer: Self-pay | Admitting: Hematology

## 2023-08-22 DIAGNOSIS — C182 Malignant neoplasm of ascending colon: Secondary | ICD-10-CM

## 2023-08-22 MED ORDER — BRAFTOVI 75 MG PO CAPS
300.0000 mg | ORAL_CAPSULE | Freq: Every day | ORAL | 0 refills | Status: DC
  Filled 2023-08-23: qty 120, 30d supply, fill #0

## 2023-08-22 NOTE — Progress Notes (Signed)
Specialty Pharmacy Refill Coordination Note  Carlos Miranda is a 62 y.o. male contacted today regarding refills of specialty medication(s) Encorafenib   Patient requested Delivery   Delivery date: 08/27/23   Verified address: [address]  [facility] [place] [address]   Medication will be filled on 08/24/23 pending a refill request.

## 2023-08-23 ENCOUNTER — Other Ambulatory Visit: Payer: Self-pay

## 2023-08-23 ENCOUNTER — Other Ambulatory Visit (HOSPITAL_COMMUNITY): Payer: Self-pay

## 2023-08-23 DIAGNOSIS — H524 Presbyopia: Secondary | ICD-10-CM | POA: Diagnosis not present

## 2023-08-23 DIAGNOSIS — H52223 Regular astigmatism, bilateral: Secondary | ICD-10-CM | POA: Diagnosis not present

## 2023-08-23 NOTE — Progress Notes (Signed)
Refill received

## 2023-08-24 ENCOUNTER — Other Ambulatory Visit (HOSPITAL_COMMUNITY): Payer: Self-pay

## 2023-08-24 ENCOUNTER — Encounter: Payer: Self-pay | Admitting: Hematology

## 2023-08-24 ENCOUNTER — Other Ambulatory Visit: Payer: Self-pay

## 2023-08-27 ENCOUNTER — Inpatient Hospital Stay: Payer: No Typology Code available for payment source

## 2023-08-27 ENCOUNTER — Encounter: Payer: Self-pay | Admitting: Hematology

## 2023-08-27 ENCOUNTER — Other Ambulatory Visit: Payer: Self-pay

## 2023-08-27 ENCOUNTER — Inpatient Hospital Stay (HOSPITAL_BASED_OUTPATIENT_CLINIC_OR_DEPARTMENT_OTHER): Payer: No Typology Code available for payment source | Admitting: Hematology

## 2023-08-27 VITALS — BP 131/78 | HR 100 | Temp 97.6°F | Resp 20 | Wt 225.3 lb

## 2023-08-27 DIAGNOSIS — C182 Malignant neoplasm of ascending colon: Secondary | ICD-10-CM

## 2023-08-27 DIAGNOSIS — Z95828 Presence of other vascular implants and grafts: Secondary | ICD-10-CM

## 2023-08-27 LAB — CBC WITH DIFFERENTIAL/PLATELET
Abs Immature Granulocytes: 0.05 10*3/uL (ref 0.00–0.07)
Basophils Absolute: 0.1 10*3/uL (ref 0.0–0.1)
Basophils Relative: 1 %
Eosinophils Absolute: 0.6 10*3/uL — ABNORMAL HIGH (ref 0.0–0.5)
Eosinophils Relative: 8 %
HCT: 47.5 % (ref 39.0–52.0)
Hemoglobin: 15.4 g/dL (ref 13.0–17.0)
Immature Granulocytes: 1 %
Lymphocytes Relative: 21 %
Lymphs Abs: 1.4 10*3/uL (ref 0.7–4.0)
MCH: 30 pg (ref 26.0–34.0)
MCHC: 32.4 g/dL (ref 30.0–36.0)
MCV: 92.6 fL (ref 80.0–100.0)
Monocytes Absolute: 0.6 10*3/uL (ref 0.1–1.0)
Monocytes Relative: 9 %
Neutro Abs: 4.1 10*3/uL (ref 1.7–7.7)
Neutrophils Relative %: 60 %
Platelets: 201 10*3/uL (ref 150–400)
RBC: 5.13 MIL/uL (ref 4.22–5.81)
RDW: 13.4 % (ref 11.5–15.5)
WBC: 6.8 10*3/uL (ref 4.0–10.5)
nRBC: 0 % (ref 0.0–0.2)

## 2023-08-27 LAB — COMPREHENSIVE METABOLIC PANEL
ALT: 17 U/L (ref 0–44)
AST: 19 U/L (ref 15–41)
Albumin: 3.7 g/dL (ref 3.5–5.0)
Alkaline Phosphatase: 88 U/L (ref 38–126)
Anion gap: 11 (ref 5–15)
BUN: 18 mg/dL (ref 8–23)
CO2: 19 mmol/L — ABNORMAL LOW (ref 22–32)
Calcium: 8.9 mg/dL (ref 8.9–10.3)
Chloride: 106 mmol/L (ref 98–111)
Creatinine, Ser: 0.69 mg/dL (ref 0.61–1.24)
GFR, Estimated: >60 mL/min (ref >60)
Glucose, Bld: 111 mg/dL — ABNORMAL HIGH (ref 70–99)
Potassium: 4.2 mmol/L (ref 3.5–5.1)
Sodium: 136 mmol/L (ref 135–145)
Total Bilirubin: 0.4 mg/dL (ref 0.3–1.2)
Total Protein: 7.1 g/dL (ref 6.5–8.1)

## 2023-08-27 LAB — MAGNESIUM: Magnesium: 1.6 mg/dL — ABNORMAL LOW (ref 1.7–2.4)

## 2023-08-27 MED ORDER — SODIUM CHLORIDE 0.9% FLUSH
10.0000 mL | INTRAVENOUS | Status: AC
  Administered 2023-08-27: 10 mL

## 2023-08-27 MED ORDER — HEPARIN SOD (PORK) LOCK FLUSH 100 UNIT/ML IV SOLN
500.0000 [IU] | Freq: Once | INTRAVENOUS | Status: AC
  Administered 2023-08-27: 500 [IU] via INTRAVENOUS

## 2023-08-27 MED ORDER — SODIUM CHLORIDE 0.9% FLUSH
10.0000 mL | INTRAVENOUS | Status: DC | PRN
  Administered 2023-08-27: 10 mL via INTRAVENOUS

## 2023-08-27 NOTE — Patient Instructions (Addendum)
[facility]  Discharge Instructions  You were seen and examined today by Dr. Ellin Saba.  Dr. Ellin Saba discussed your most recent PET scan which revealed cancer progression.  Dr. Ellin Saba has recommended restarting chemotherapy as the current treatment is no longer working to keep the cancer at bay. We can recycle FOLFOX as you previously got in 2022, or we can change the medication that causes neuropathy from Oxaliplatin to Irinotecan. In addition to chemotherapy, you will have an antibody called Avastin.  Follow-up as scheduled.  Thank you for choosing [facility] to provide your oncology and hematology care.   To afford each patient quality time with our provider, please arrive at least 15 minutes before your scheduled appointment time. You may need to reschedule your appointment if you arrive late (10 or more minutes). Arriving late affects you and other patients whose appointments are after yours.  Also, if you miss three or more appointments without notifying the office, you may be dismissed from the clinic at the provider's discretion.    Again, thank you for choosing [facility].  Our hope is that these requests will decrease the amount of time that you wait before being seen by our physicians.   If you have a lab appointment with the Cancer Center - please note that after April 8th, all labs will be drawn in the cancer center.  You do not have to check in or register with the main entrance as you have in the past but will complete your check-in at the cancer center.            _____________________________________________________________  Should you have questions after your visit to [facility], please contact our office at [phone] and follow the prompts.  Our office hours are 8:00 a.m. to 4:30 p.m. Monday - Thursday and 8:00 a.m. to 2:30 p.m. Friday.  Please note that voicemails left after 4:00  p.m. may not be returned until the following business day.  We are closed weekends and all major holidays.  You do have access to a nurse 24-7, just call the main number to the clinic [phone] and do not press any options, hold on the line and a nurse will answer the phone.    For prescription refill requests, have your pharmacy contact our office and allow 72 hours.    Masks are no longer required in the cancer centers. If you would like for your care team to wear a mask while they are taking care of you, please let them know. You may have one support person who is at least 62 years old accompany you for your appointments.

## 2023-08-27 NOTE — Progress Notes (Signed)
Patient was notified in delay in delivery, medication will be shipped10/28/24 and patient receive delivery on 08/28/23 to verified address: [address] [place] [address]

## 2023-08-27 NOTE — Progress Notes (Signed)
Patient contacted [place] back before [employer] picked up the package to advise that his provider has discontinued this therapy. Braftovi returned to stock. Confirmed with Florentina Addison that patient should be disenrolled.

## 2023-08-27 NOTE — Progress Notes (Signed)
No treatment today per A. Dareen Piano RN / Dr. Ellin Saba due to progression. Discharged from clinic ambulatory in stable condition. Alert and oriented x 3. F/U with [facility] as scheduled.
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
## 2023-08-27 NOTE — Progress Notes (Signed)
DISCONTINUE ON PATHWAY REGIMEN - Colorectal     Cycle 1: A cycle is 28 days:     Encorafenib      Cetuximab      Cetuximab    Cycles 2 and beyond: A cycle is every 28 days:     Encorafenib      Cetuximab   **Always confirm dose/schedule in your pharmacy ordering system**  REASON: Disease Progression PRIOR TREATMENT: [id number]: Encorafenib 300 mg Daily D1-28 + Cetuximab 400/250 mg/m2 D1,8,15,22 q28 Days TREATMENT RESPONSE: Progressive Disease (PD)  START ON PATHWAY REGIMEN - Colorectal     A cycle is every 14 days:     Bevacizumab-xxxx      Irinotecan      Leucovorin      Fluorouracil      Fluorouracil   **Always confirm dose/schedule in your pharmacy ordering system**  Patient Characteristics: Distant Metastases, Nonsurgical Candidate, BRAF V600 Mutation Positive (KRAS/NRAS Wild-Type), Standard Cytotoxic/Targeted Therapy, Third Line Standard Cytotoxic/Targeted Therapy Tumor Location: Colon Therapeutic Status: Distant Metastases Microsatellite/Mismatch Repair Status: MSS/pMMR BRAF Mutation Status: Mutation Positive KRAS/NRAS Mutation Status: Wild-Type (no mutation) Standard Cytotoxic/Targeted Line of Therapy: Third Line Standard Cytotoxic/Targeted Therapy Intent of Therapy: Non-Curative / Palliative Intent, Discussed with Patient

## 2023-08-27 NOTE — Progress Notes (Signed)
Patients port flushed without difficulty.  Good blood return noted with no bruising or swelling noted at site.  Patient remains accessed for treatment.  

## 2023-08-28 ENCOUNTER — Other Ambulatory Visit: Payer: Self-pay

## 2023-08-30 ENCOUNTER — Other Ambulatory Visit: Payer: Self-pay

## 2023-08-30 DIAGNOSIS — E669 Obesity, unspecified: Secondary | ICD-10-CM | POA: Diagnosis not present

## 2023-08-30 DIAGNOSIS — E785 Hyperlipidemia, unspecified: Secondary | ICD-10-CM | POA: Diagnosis not present

## 2023-08-30 DIAGNOSIS — F17221 Nicotine dependence, chewing tobacco, in remission: Secondary | ICD-10-CM | POA: Diagnosis not present

## 2023-08-30 DIAGNOSIS — I10 Essential (primary) hypertension: Secondary | ICD-10-CM | POA: Diagnosis not present

## 2023-08-30 DIAGNOSIS — G4733 Obstructive sleep apnea (adult) (pediatric): Secondary | ICD-10-CM | POA: Diagnosis not present

## 2023-08-30 DIAGNOSIS — Z6830 Body mass index (BMI) 30.0-30.9, adult: Secondary | ICD-10-CM | POA: Diagnosis not present

## 2023-08-30 DIAGNOSIS — Z008 Encounter for other general examination: Secondary | ICD-10-CM | POA: Diagnosis not present

## 2023-08-30 DIAGNOSIS — G40909 Epilepsy, unspecified, not intractable, without status epilepticus: Secondary | ICD-10-CM | POA: Diagnosis not present

## 2023-08-30 NOTE — Progress Notes (Signed)
Pharmacist Chemotherapy Monitoring - Initial Assessment    Anticipated start date: 09/03/23   The following has been reviewed per standard work regarding the patient's treatment regimen: The patient's diagnosis, treatment plan and drug doses, and organ/hematologic function Lab orders and baseline tests specific to treatment regimen  The treatment plan start date, drug sequencing, and pre-medications Prior authorization status  Patient's documented medication list, including drug-drug interaction screen and prescriptions for anti-emetics and supportive care specific to the treatment regimen The drug concentrations, fluid compatibility, administration routes, and timing of the medications to be used The patient's access for treatment and lifetime cumulative dose history, if applicable  The patient's medication allergies and previous infusion related reactions, if applicable   Changes made to treatment plan:  N/A  Follow up needed:  N/A   Stephens Shire, [facility], 08/30/2023  12:53 PM

## 2023-08-31 ENCOUNTER — Telehealth: Payer: Self-pay | Admitting: Pulmonary Disease

## 2023-08-31 ENCOUNTER — Ambulatory Visit (INDEPENDENT_AMBULATORY_CARE_PROVIDER_SITE_OTHER): Payer: No Typology Code available for payment source | Admitting: Neurology

## 2023-08-31 ENCOUNTER — Encounter: Payer: Self-pay | Admitting: Neurology

## 2023-08-31 ENCOUNTER — Other Ambulatory Visit: Payer: Self-pay

## 2023-08-31 VITALS — BP 117/79 | HR 99 | Ht 68.0 in | Wt 228.2 lb

## 2023-08-31 DIAGNOSIS — G40219 Localization-related (focal) (partial) symptomatic epilepsy and epileptic syndromes with complex partial seizures, intractable, without status epilepticus: Secondary | ICD-10-CM

## 2023-08-31 MED ORDER — OXCARBAZEPINE ER 600 MG PO TB24: ORAL_TABLET | ORAL | 3 refills | Status: AC

## 2023-08-31 MED ORDER — XCOPRI 100 MG PO TABS: ORAL_TABLET | ORAL | 3 refills | Status: AC

## 2023-08-31 MED ORDER — LAMOTRIGINE ER 300 MG PO TB24: ORAL_TABLET | ORAL | 3 refills | Status: AC
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
## 2023-09-02 ENCOUNTER — Other Ambulatory Visit: Payer: Self-pay

## 2023-09-03 ENCOUNTER — Inpatient Hospital Stay: Payer: No Typology Code available for payment source

## 2023-09-03 ENCOUNTER — Encounter: Payer: Self-pay | Admitting: Neurology

## 2023-09-03 ENCOUNTER — Inpatient Hospital Stay: Payer: No Typology Code available for payment source | Attending: Hematology

## 2023-09-03 ENCOUNTER — Encounter: Payer: Self-pay | Admitting: Hematology

## 2023-09-03 VITALS — BP 132/87 | HR 84 | Temp 98.9°F | Resp 20

## 2023-09-03 DIAGNOSIS — Z9049 Acquired absence of other specified parts of digestive tract: Secondary | ICD-10-CM | POA: Insufficient documentation

## 2023-09-03 DIAGNOSIS — Z5986 Financial insecurity: Secondary | ICD-10-CM | POA: Diagnosis not present

## 2023-09-03 DIAGNOSIS — Z880 Allergy status to penicillin: Secondary | ICD-10-CM | POA: Diagnosis not present

## 2023-09-03 DIAGNOSIS — Z79631 Long term (current) use of antimetabolite agent: Secondary | ICD-10-CM | POA: Insufficient documentation

## 2023-09-03 DIAGNOSIS — R918 Other nonspecific abnormal finding of lung field: Secondary | ICD-10-CM | POA: Diagnosis not present

## 2023-09-03 DIAGNOSIS — Z8269 Family history of other diseases of the musculoskeletal system and connective tissue: Secondary | ICD-10-CM | POA: Insufficient documentation

## 2023-09-03 DIAGNOSIS — I10 Essential (primary) hypertension: Secondary | ICD-10-CM | POA: Insufficient documentation

## 2023-09-03 DIAGNOSIS — Z803 Family history of malignant neoplasm of breast: Secondary | ICD-10-CM | POA: Diagnosis not present

## 2023-09-03 DIAGNOSIS — C182 Malignant neoplasm of ascending colon: Secondary | ICD-10-CM | POA: Diagnosis not present

## 2023-09-03 DIAGNOSIS — Z809 Family history of malignant neoplasm, unspecified: Secondary | ICD-10-CM | POA: Diagnosis not present

## 2023-09-03 DIAGNOSIS — C786 Secondary malignant neoplasm of retroperitoneum and peritoneum: Secondary | ICD-10-CM | POA: Diagnosis not present

## 2023-09-03 DIAGNOSIS — R21 Rash and other nonspecific skin eruption: Secondary | ICD-10-CM | POA: Insufficient documentation

## 2023-09-03 DIAGNOSIS — E785 Hyperlipidemia, unspecified: Secondary | ICD-10-CM | POA: Insufficient documentation

## 2023-09-03 DIAGNOSIS — Z882 Allergy status to sulfonamides status: Secondary | ICD-10-CM | POA: Diagnosis not present

## 2023-09-03 DIAGNOSIS — Z5112 Encounter for antineoplastic immunotherapy: Secondary | ICD-10-CM | POA: Diagnosis not present

## 2023-09-03 DIAGNOSIS — Z8249 Family history of ischemic heart disease and other diseases of the circulatory system: Secondary | ICD-10-CM | POA: Diagnosis not present

## 2023-09-03 DIAGNOSIS — Z79899 Other long term (current) drug therapy: Secondary | ICD-10-CM | POA: Insufficient documentation

## 2023-09-03 DIAGNOSIS — N433 Hydrocele, unspecified: Secondary | ICD-10-CM | POA: Diagnosis not present

## 2023-09-03 DIAGNOSIS — Z8261 Family history of arthritis: Secondary | ICD-10-CM | POA: Insufficient documentation

## 2023-09-03 DIAGNOSIS — Z888 Allergy status to other drugs, medicaments and biological substances status: Secondary | ICD-10-CM | POA: Insufficient documentation

## 2023-09-03 DIAGNOSIS — R197 Diarrhea, unspecified: Secondary | ICD-10-CM | POA: Diagnosis not present

## 2023-09-03 DIAGNOSIS — Z8349 Family history of other endocrine, nutritional and metabolic diseases: Secondary | ICD-10-CM | POA: Diagnosis not present

## 2023-09-03 LAB — URINALYSIS, DIPSTICK ONLY
Bilirubin Urine: NEGATIVE
Glucose, UA: NEGATIVE mg/dL
Hgb urine dipstick: NEGATIVE
Ketones, ur: NEGATIVE mg/dL
Leukocytes,Ua: NEGATIVE
Nitrite: NEGATIVE
Protein, ur: NEGATIVE mg/dL
Specific Gravity, Urine: 1.023 (ref 1.005–1.030)
pH: 6 (ref 5.0–8.0)

## 2023-09-03 LAB — CBC WITH DIFFERENTIAL/PLATELET
Abs Immature Granulocytes: 0.05 10*3/uL (ref 0.00–0.07)
Basophils Absolute: 0.1 10*3/uL (ref 0.0–0.1)
Basophils Relative: 1 %
Eosinophils Absolute: 0.7 10*3/uL — ABNORMAL HIGH (ref 0.0–0.5)
Eosinophils Relative: 9 %
HCT: 47.6 % (ref 39.0–52.0)
Hemoglobin: 15.5 g/dL (ref 13.0–17.0)
Immature Granulocytes: 1 %
Lymphocytes Relative: 19 %
Lymphs Abs: 1.4 10*3/uL (ref 0.7–4.0)
MCH: 30.1 pg (ref 26.0–34.0)
MCHC: 32.6 g/dL (ref 30.0–36.0)
MCV: 92.4 fL (ref 80.0–100.0)
Monocytes Absolute: 0.5 10*3/uL (ref 0.1–1.0)
Monocytes Relative: 6 %
Neutro Abs: 4.7 10*3/uL (ref 1.7–7.7)
Neutrophils Relative %: 64 %
Platelets: 208 10*3/uL (ref 150–400)
RBC: 5.15 MIL/uL (ref 4.22–5.81)
RDW: 13.3 % (ref 11.5–15.5)
WBC: 7.4 10*3/uL (ref 4.0–10.5)
nRBC: 0 % (ref 0.0–0.2)

## 2023-09-03 LAB — COMPREHENSIVE METABOLIC PANEL
ALT: 31 U/L (ref 0–44)
AST: 22 U/L (ref 15–41)
Albumin: 3.5 g/dL (ref 3.5–5.0)
Alkaline Phosphatase: 104 U/L (ref 38–126)
Anion gap: 6 (ref 5–15)
BUN: 11 mg/dL (ref 8–23)
CO2: 26 mmol/L (ref 22–32)
Calcium: 8.5 mg/dL — ABNORMAL LOW (ref 8.9–10.3)
Chloride: 104 mmol/L (ref 98–111)
Creatinine, Ser: 0.7 mg/dL (ref 0.61–1.24)
GFR, Estimated: >60 mL/min (ref >60)
Glucose, Bld: 124 mg/dL — ABNORMAL HIGH (ref 70–99)
Potassium: 3.7 mmol/L (ref 3.5–5.1)
Sodium: 136 mmol/L (ref 135–145)
Total Bilirubin: 0.5 mg/dL (ref <1.2)
Total Protein: 6.8 g/dL (ref 6.5–8.1)

## 2023-09-03 LAB — MAGNESIUM: Magnesium: 1.8 mg/dL (ref 1.7–2.4)

## 2023-09-03 MED ORDER — HEPARIN SOD (PORK) LOCK FLUSH 100 UNIT/ML IV SOLN: 500.0000 [IU] | Freq: Once | INTRAVENOUS | Status: DC | PRN

## 2023-09-03 MED ORDER — SODIUM CHLORIDE 0.9% FLUSH: 10.0000 mL | INTRAVENOUS | Status: DC | PRN

## 2023-09-03 MED ORDER — SODIUM CHLORIDE 0.9 % IV SOLN
400.0000 mg/m2 | Freq: Once | INTRAVENOUS | Status: AC
  Administered 2023-09-03: 884 mg via INTRAVENOUS
  Filled 2023-09-03: qty 44.2

## 2023-09-03 MED ORDER — SODIUM CHLORIDE 0.9 % IV SOLN
180.0000 mg/m2 | Freq: Once | INTRAVENOUS | Status: AC
Start: 2023-09-03 — End: 2023-09-03
  Administered 2023-09-03: 400 mg via INTRAVENOUS
  Filled 2023-09-03: qty 20

## 2023-09-03 MED ORDER — DEXAMETHASONE SODIUM PHOSPHATE 100 MG/10ML IJ SOLN: 10.0000 mg | Freq: Once | INTRAMUSCULAR | Status: DC

## 2023-09-03 MED ORDER — SODIUM CHLORIDE 0.9 % IV SOLN: INTRAVENOUS | Status: DC

## 2023-09-03 MED ORDER — SODIUM CHLORIDE 0.9 % IV SOLN
5.0000 mg/kg | Freq: Once | INTRAVENOUS | Status: AC
  Administered 2023-09-03: 500 mg via INTRAVENOUS
  Filled 2023-09-03: qty 16

## 2023-09-03 MED ORDER — FLUOROURACIL CHEMO INJECTION 2.5 GM/50ML
400.0000 mg/m2 | Freq: Once | INTRAVENOUS | Status: AC
Start: 2023-09-03 — End: 2023-09-03
  Administered 2023-09-03: 900 mg via INTRAVENOUS
  Filled 2023-09-03: qty 18

## 2023-09-03 MED ORDER — SODIUM CHLORIDE 0.9 % IV SOLN
2400.0000 mg/m2 | INTRAVENOUS | Status: DC
  Administered 2023-09-03: 5000 mg via INTRAVENOUS
  Filled 2023-09-03: qty 100

## 2023-09-03 MED ORDER — ATROPINE SULFATE 1 MG/ML IV SOLN
0.5000 mg | Freq: Once | INTRAVENOUS | Status: AC | PRN
  Administered 2023-09-03: 0.5 mg via INTRAVENOUS
  Filled 2023-09-03: qty 1

## 2023-09-03 MED ORDER — DEXAMETHASONE SODIUM PHOSPHATE 10 MG/ML IJ SOLN
10.0000 mg | Freq: Once | INTRAMUSCULAR | Status: AC
Start: 2023-09-03 — End: 2023-09-03
  Administered 2023-09-03: 10 mg via INTRAVENOUS
  Filled 2023-09-03: qty 1

## 2023-09-03 MED ORDER — PALONOSETRON HCL INJECTION 0.25 MG/5ML
0.2500 mg | Freq: Once | INTRAVENOUS | Status: AC
  Administered 2023-09-03: 0.25 mg via INTRAVENOUS
  Filled 2023-09-03: qty 5

## 2023-09-03 NOTE — Telephone Encounter (Signed)
Lm x1 for patient.  

## 2023-09-03 NOTE — Patient Instructions (Signed)
[facility]  Discharge Instructions: Thank you for choosing [facility] to provide your oncology and hematology care.  If you have a lab appointment with the Cancer Center - please note that after April 8th, 2024, all labs will be drawn in the cancer center.  You do not have to check in or register with the main entrance as you have in the past but will complete your check-in in the cancer center.  Wear comfortable clothing and clothing appropriate for easy access to any Portacath or PICC line.   We strive to give you quality time with your provider. You may need to reschedule your appointment if you arrive late (15 or more minutes).  Arriving late affects you and other patients whose appointments are after yours.  Also, if you miss three or more appointments without notifying the office, you may be dismissed from the clinic at the provider's discretion.      For prescription refill requests, have your pharmacy contact our office and allow 72 hours for refills to be completed.    Today you received the following chemotherapy and/or immunotherapy agents Folfiri with 5FU pump start      To help prevent nausea and vomiting after your treatment, we encourage you to take your nausea medication as directed.  BELOW ARE SYMPTOMS THAT SHOULD BE REPORTED IMMEDIATELY: *FEVER GREATER THAN 100.4 F (38 C) OR HIGHER *CHILLS OR SWEATING *NAUSEA AND VOMITING THAT IS NOT CONTROLLED WITH YOUR NAUSEA MEDICATION *UNUSUAL SHORTNESS OF BREATH *UNUSUAL BRUISING OR BLEEDING *URINARY PROBLEMS (pain or burning when urinating, or frequent urination) *BOWEL PROBLEMS (unusual diarrhea, constipation, pain near the anus) TENDERNESS IN MOUTH AND THROAT WITH OR WITHOUT PRESENCE OF ULCERS (sore throat, sores in mouth, or a toothache) UNUSUAL RASH, SWELLING OR PAIN  UNUSUAL VAGINAL DISCHARGE OR ITCHING   Items with * indicate a potential emergency and should be followed up as soon as  possible or go to the Emergency Department if any problems should occur.  Please show the CHEMOTHERAPY ALERT CARD or IMMUNOTHERAPY ALERT CARD at check-in to the Emergency Department and triage nurse.  Should you have questions after your visit or need to cancel or reschedule your appointment, please contact [facility] AT [facility] [phone]  and follow the prompts.  Office hours are 8:00 a.m. to 4:30 p.m. Monday - Friday. Please note that voicemails left after 4:00 p.m. may not be returned until the following business day.  We are closed weekends and major holidays. You have access to a nurse at all times for urgent questions. Please call the main number to the clinic [phone] and follow the prompts.  For any non-urgent questions, you may also contact your provider using MyChart. We now offer e-Visits for anyone 33 and older to request care online for non-urgent symptoms. For details visit [email or web address].   Also download the MyChart app! Go to the app store, search "MyChart", open the app, select [place] [place], and log in with your MyChart username and password.

## 2023-09-03 NOTE — Progress Notes (Signed)
Patient presents today for D1C1 Folfiri with 5FU pump start per providers order.  Vital signs and labs within parameters for treatment.  Patient has no new complaints at this time.  Treatment given today per MD orders.  Tolerated infusion without adverse affects.  Vital signs stable.  5FU pump connected and Verified RUN on the screen with the patient.  No complaints at this time.  Discharge from clinic ambulatory in stable condition.  Alert and oriented X 3.  Follow up with [facility] as scheduled.

## 2023-09-05 ENCOUNTER — Inpatient Hospital Stay: Payer: No Typology Code available for payment source

## 2023-09-05 VITALS — BP 130/72 | HR 90 | Temp 97.6°F | Resp 20

## 2023-09-05 DIAGNOSIS — C182 Malignant neoplasm of ascending colon: Secondary | ICD-10-CM

## 2023-09-05 MED ORDER — HEPARIN SOD (PORK) LOCK FLUSH 100 UNIT/ML IV SOLN
500.0000 [IU] | Freq: Once | INTRAVENOUS | Status: AC | PRN
  Administered 2023-09-05: 500 [IU]

## 2023-09-05 MED ORDER — SODIUM CHLORIDE 0.9% FLUSH
10.0000 mL | INTRAVENOUS | Status: DC | PRN
Start: 2023-09-05 — End: 2023-09-05
  Administered 2023-09-05: 10 mL

## 2023-09-05 NOTE — Patient Instructions (Signed)

## 2023-09-05 NOTE — Progress Notes (Signed)
Patient presents today for 5FU pump stop and disconnect. Patient's pump disconnected without issue. Patient's port flushed without difficulty.  Good blood return noted with no bruising or swelling noted at site.  Band aid applied.  VSS with discharge and left in satisfactory condition with no s/s of distress noted.

## 2023-09-07 ENCOUNTER — Telehealth: Payer: Self-pay | Admitting: Neurology

## 2023-09-07 NOTE — Telephone Encounter (Signed)
Patient states he talked to aquino about this the other day. He tried to put it in [place] but it didn't work for him. He is moving out of state and would like Dr.Aquino to send a referral to anyone in   [place], [place] [id number]  [place] 1st option [place] 2nd option [place] 3rd option if he has to. He rather not use this one but if he has to he will.  He is asking for his medicine to not be interrupted if possible so he can keep his meds updated  Maybe a 90 days supply so he can have it till he gets situated

## 2023-09-07 NOTE — Telephone Encounter (Signed)
Pt called an advised that when he moves he needs to call me with the name of the provider an the number and I will send your information to them, pt stated that he was told we would do it, I told him I was not able to track down providers in other states pt again said that was not what I was told I again told him to get me the information I would send the referral, he said thank you and hung up.

## 2023-09-07 NOTE — Telephone Encounter (Signed)
Patient states that Lunesta in not in network for him.  Please advise on what is on formulary.  Thank you.

## 2023-09-09 ENCOUNTER — Encounter: Payer: Self-pay | Admitting: Neurology

## 2023-09-10 ENCOUNTER — Encounter: Payer: Self-pay | Admitting: Hematology

## 2023-09-11 ENCOUNTER — Encounter: Payer: Self-pay | Admitting: Hematology

## 2023-09-11 ENCOUNTER — Other Ambulatory Visit (HOSPITAL_COMMUNITY): Payer: Self-pay

## 2023-09-11 ENCOUNTER — Inpatient Hospital Stay (HOSPITAL_BASED_OUTPATIENT_CLINIC_OR_DEPARTMENT_OTHER): Payer: No Typology Code available for payment source | Admitting: Hematology

## 2023-09-11 ENCOUNTER — Inpatient Hospital Stay: Payer: No Typology Code available for payment source

## 2023-09-11 VITALS — BP 152/91 | HR 98 | Temp 99.3°F | Resp 20 | Wt 225.0 lb

## 2023-09-11 DIAGNOSIS — Z95828 Presence of other vascular implants and grafts: Secondary | ICD-10-CM

## 2023-09-11 DIAGNOSIS — C182 Malignant neoplasm of ascending colon: Secondary | ICD-10-CM

## 2023-09-11 LAB — CBC WITH DIFFERENTIAL/PLATELET
Abs Immature Granulocytes: 0.02 10*3/uL (ref 0.00–0.07)
Basophils Absolute: 0 10*3/uL (ref 0.0–0.1)
Basophils Relative: 1 %
Eosinophils Absolute: 0.4 10*3/uL (ref 0.0–0.5)
Eosinophils Relative: 7 %
HCT: 44.8 % (ref 39.0–52.0)
Hemoglobin: 14.7 g/dL (ref 13.0–17.0)
Immature Granulocytes: 0 %
Lymphocytes Relative: 24 %
Lymphs Abs: 1.4 10*3/uL (ref 0.7–4.0)
MCH: 30.3 pg (ref 26.0–34.0)
MCHC: 32.8 g/dL (ref 30.0–36.0)
MCV: 92.4 fL (ref 80.0–100.0)
Monocytes Absolute: 0.3 10*3/uL (ref 0.1–1.0)
Monocytes Relative: 4 %
Neutro Abs: 3.6 10*3/uL (ref 1.7–7.7)
Neutrophils Relative %: 64 %
Platelets: 219 10*3/uL (ref 150–400)
RBC: 4.85 MIL/uL (ref 4.22–5.81)
RDW: 13.2 % (ref 11.5–15.5)
WBC: 5.7 10*3/uL (ref 4.0–10.5)
nRBC: 0 % (ref 0.0–0.2)

## 2023-09-11 LAB — COMPREHENSIVE METABOLIC PANEL
ALT: 33 U/L (ref 0–44)
AST: 27 U/L (ref 15–41)
Albumin: 3.6 g/dL (ref 3.5–5.0)
Alkaline Phosphatase: 119 U/L (ref 38–126)
Anion gap: 7 (ref 5–15)
BUN: 15 mg/dL (ref 8–23)
CO2: 24 mmol/L (ref 22–32)
Calcium: 8.7 mg/dL — ABNORMAL LOW (ref 8.9–10.3)
Chloride: 106 mmol/L (ref 98–111)
Creatinine, Ser: 0.81 mg/dL (ref 0.61–1.24)
GFR, Estimated: >60 mL/min (ref >60)
Glucose, Bld: 127 mg/dL — ABNORMAL HIGH (ref 70–99)
Potassium: 3.8 mmol/L (ref 3.5–5.1)
Sodium: 137 mmol/L (ref 135–145)
Total Bilirubin: 0.3 mg/dL (ref <1.2)
Total Protein: 7.2 g/dL (ref 6.5–8.1)

## 2023-09-11 LAB — MAGNESIUM: Magnesium: 1.8 mg/dL (ref 1.7–2.4)

## 2023-09-11 MED ORDER — HEPARIN SOD (PORK) LOCK FLUSH 100 UNIT/ML IV SOLN
500.0000 [IU] | Freq: Once | INTRAVENOUS | Status: AC
Start: 2023-09-11 — End: 2023-09-11
  Administered 2023-09-11: 500 [IU] via INTRAVENOUS

## 2023-09-11 MED ORDER — SODIUM CHLORIDE 0.9% FLUSH
10.0000 mL | INTRAVENOUS | Status: DC | PRN
  Administered 2023-09-11: 10 mL via INTRAVENOUS

## 2023-09-11 NOTE — Patient Instructions (Signed)
[facility] Discharge Instructions   You were seen and examined today by Dr. Ellin Saba.  He reviewed the results of you lab work which are normal/stable.   We will plan to treat you on Monday as scheduled.   Return as scheduled.    Thank you for choosing [facility] at [facility] to provide your oncology and hematology care.  To afford each patient quality time with our provider, please arrive at least 15 minutes before your scheduled appointment time.   If you have a lab appointment with the Cancer Center please come in thru the Main Entrance and check in at the main information desk.  You need to re-schedule your appointment should you arrive 10 or more minutes late.  We strive to give you quality time with our providers, and arriving late affects you and other patients whose appointments are after yours.  Also, if you no show three or more times for appointments you may be dismissed from the clinic at the providers discretion.     Again, thank you for choosing [facility].  Our hope is that these requests will decrease the amount of time that you wait before being seen by our physicians.       _____________________________________________________________  Should you have questions after your visit to [facility], please contact our office at [phone] and follow the prompts.  Our office hours are 8:00 a.m. and 4:30 p.m. Monday - Friday.  Please note that voicemails left after 4:00 p.m. may not be returned until the following business day.  We are closed weekends and major holidays.  You do have access to a nurse 24-7, just call the main number to the clinic [phone] and do not press any options, hold on the line and a nurse will answer the phone.    For prescription refill requests, have your pharmacy contact our office and allow 72 hours.    Due to Covid, you will need to wear a mask upon  entering the hospital. If you do not have a mask, a mask will be given to you at the Main Entrance upon arrival. For doctor visits, patients may have 1 support person age 65 or older with them. For treatment visits, patients can not have anyone with them due to social distancing guidelines and our immunocompromised population.

## 2023-09-11 NOTE — Progress Notes (Signed)
Patients port flushed without difficulty.  Good blood return noted with no bruising or swelling noted at site.  Band aid applied.  VSS with discharge and left in satisfactory condition with no s/s of distress noted.   

## 2023-09-11 NOTE — Telephone Encounter (Signed)
Patient will need to contact insurance in order to find out what is covered as the pa team cannot see formularies. When doing a test claim, no alternatives are given.
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
## 2023-09-13 ENCOUNTER — Telehealth: Payer: Self-pay | Admitting: Pulmonary Disease

## 2023-09-13 NOTE — Telephone Encounter (Signed)
2nd message left for patient to return call re: Alfonso Patten

## 2023-09-13 NOTE — Telephone Encounter (Signed)
Patient states Doxepin, Ramelteon, Zolopidem, and Temazepam are covered by insurance. Pharmacy is [facility]. Patient phone number is [phone].

## 2023-09-14 NOTE — Telephone Encounter (Signed)
Routing to Dr. Jenetta Downer for review.

## 2023-09-17 ENCOUNTER — Inpatient Hospital Stay: Payer: No Typology Code available for payment source

## 2023-09-17 ENCOUNTER — Encounter: Payer: Self-pay | Admitting: Hematology

## 2023-09-17 VITALS — BP 128/81 | HR 73 | Temp 98.3°F | Resp 18 | Wt 227.2 lb

## 2023-09-17 DIAGNOSIS — C182 Malignant neoplasm of ascending colon: Secondary | ICD-10-CM

## 2023-09-17 LAB — CBC WITH DIFFERENTIAL/PLATELET
Abs Immature Granulocytes: 0.01 10*3/uL (ref 0.00–0.07)
Basophils Absolute: 0.1 10*3/uL (ref 0.0–0.1)
Basophils Relative: 1 %
Eosinophils Absolute: 0.5 10*3/uL (ref 0.0–0.5)
Eosinophils Relative: 12 %
HCT: 45.1 % (ref 39.0–52.0)
Hemoglobin: 14.6 g/dL (ref 13.0–17.0)
Immature Granulocytes: 0 %
Lymphocytes Relative: 42 %
Lymphs Abs: 1.7 10*3/uL (ref 0.7–4.0)
MCH: 30 pg (ref 26.0–34.0)
MCHC: 32.4 g/dL (ref 30.0–36.0)
MCV: 92.8 fL (ref 80.0–100.0)
Monocytes Absolute: 0.3 10*3/uL (ref 0.1–1.0)
Monocytes Relative: 8 %
Neutro Abs: 1.4 10*3/uL — ABNORMAL LOW (ref 1.7–7.7)
Neutrophils Relative %: 37 %
Platelets: 222 10*3/uL (ref 150–400)
RBC: 4.86 MIL/uL (ref 4.22–5.81)
RDW: 13.5 % (ref 11.5–15.5)
WBC: 3.9 10*3/uL — ABNORMAL LOW (ref 4.0–10.5)
nRBC: 0 % (ref 0.0–0.2)

## 2023-09-17 LAB — COMPREHENSIVE METABOLIC PANEL
ALT: 28 U/L (ref 0–44)
AST: 22 U/L (ref 15–41)
Albumin: 3.7 g/dL (ref 3.5–5.0)
Alkaline Phosphatase: 115 U/L (ref 38–126)
Anion gap: 8 (ref 5–15)
BUN: 18 mg/dL (ref 8–23)
CO2: 26 mmol/L (ref 22–32)
Calcium: 8.9 mg/dL (ref 8.9–10.3)
Chloride: 106 mmol/L (ref 98–111)
Creatinine, Ser: 0.87 mg/dL (ref 0.61–1.24)
GFR, Estimated: >60 mL/min (ref >60)
Glucose, Bld: 110 mg/dL — ABNORMAL HIGH (ref 70–99)
Potassium: 3.6 mmol/L (ref 3.5–5.1)
Sodium: 140 mmol/L (ref 135–145)
Total Bilirubin: 0.4 mg/dL (ref <1.2)
Total Protein: 6.9 g/dL (ref 6.5–8.1)

## 2023-09-17 LAB — MAGNESIUM: Magnesium: 2 mg/dL (ref 1.7–2.4)

## 2023-09-17 MED ORDER — ATROPINE SULFATE 1 MG/ML IV SOLN
0.5000 mg | Freq: Once | INTRAVENOUS | Status: AC | PRN
  Administered 2023-09-17: 0.5 mg via INTRAVENOUS
  Filled 2023-09-17: qty 1

## 2023-09-17 MED ORDER — PALONOSETRON HCL INJECTION 0.25 MG/5ML
0.2500 mg | Freq: Once | INTRAVENOUS | Status: AC
Start: 2023-09-17 — End: 2023-09-17
  Administered 2023-09-17: 0.25 mg via INTRAVENOUS
  Filled 2023-09-17: qty 5

## 2023-09-17 MED ORDER — SODIUM CHLORIDE 0.9 % IV SOLN
400.0000 mg/m2 | Freq: Once | INTRAVENOUS | Status: AC
  Administered 2023-09-17: 884 mg via INTRAVENOUS
  Filled 2023-09-17: qty 44.2

## 2023-09-17 MED ORDER — FLUOROURACIL CHEMO INJECTION 2.5 GM/50ML
400.0000 mg/m2 | Freq: Once | INTRAVENOUS | Status: AC
  Administered 2023-09-17: 900 mg via INTRAVENOUS
  Filled 2023-09-17: qty 18

## 2023-09-17 MED ORDER — SODIUM CHLORIDE 0.9 % IV SOLN: 10.0000 mg | Freq: Once | INTRAVENOUS | Status: DC

## 2023-09-17 MED ORDER — FLUOROURACIL CHEMO INJECTION 5 GM/100ML
2400.0000 mg/m2 | INTRAVENOUS | Status: DC
  Administered 2023-09-17: 5000 mg via INTRAVENOUS
  Filled 2023-09-17: qty 100

## 2023-09-17 MED ORDER — SODIUM CHLORIDE 0.9 % IV SOLN
5.0000 mg/kg | Freq: Once | INTRAVENOUS | Status: AC
  Administered 2023-09-17: 500 mg via INTRAVENOUS
  Filled 2023-09-17: qty 16

## 2023-09-17 MED ORDER — SODIUM CHLORIDE 0.9 % IV SOLN: INTRAVENOUS | Status: DC

## 2023-09-17 MED ORDER — SODIUM CHLORIDE 0.9 % IV SOLN
180.0000 mg/m2 | Freq: Once | INTRAVENOUS | Status: AC
  Administered 2023-09-17: 400 mg via INTRAVENOUS
  Filled 2023-09-17: qty 20

## 2023-09-17 MED ORDER — DEXAMETHASONE SODIUM PHOSPHATE 10 MG/ML IJ SOLN
10.0000 mg | Freq: Once | INTRAMUSCULAR | Status: AC
  Administered 2023-09-17: 10 mg via INTRAVENOUS
  Filled 2023-09-17: qty 1

## 2023-09-17 NOTE — Progress Notes (Signed)
Patient presents today for MVASI/Folfirii infusion. Patient is in satisfactory condition with no new complaints voiced.  Vital signs are stable.  Labs reviewed and all other labs are within treatment parameters. Patient's ANC noted to be 1.4 today, MD notified. We will proceed with treatment per MD orders.    Treatment given today per MD orders. Tolerated infusion without adverse affects. Vital signs stable. No complaints at this time. Discharged from clinic ambulatory in stable condition. Alert and oriented x 3. F/U with [facility] as scheduled. 5FU ambulatory pump infusing.

## 2023-09-17 NOTE — Patient Instructions (Signed)
[facility] - A DEPT OF [facility]  Discharge Instructions: Thank you for choosing [facility] to provide your oncology and hematology care.  If you have a lab appointment with the Cancer Center - please note that after April 8th, 2024, all labs will be drawn in the cancer center.  You do not have to check in or register with the main entrance as you have in the past but will complete your check-in in the cancer center.  Wear comfortable clothing and clothing appropriate for easy access to any Portacath or PICC line.   We strive to give you quality time with your provider. You may need to reschedule your appointment if you arrive late (15 or more minutes).  Arriving late affects you and other patients whose appointments are after yours.  Also, if you miss three or more appointments without notifying the office, you may be dismissed from the clinic at the provider's discretion.      For prescription refill requests, have your pharmacy contact our office and allow 72 hours for refills to be completed.    Today you received the following chemotherapy and/or immunotherapy agents MVASI/Folfirix   To help prevent nausea and vomiting after your treatment, we encourage you to take your nausea medication as directed.  BELOW ARE SYMPTOMS THAT SHOULD BE REPORTED IMMEDIATELY: *FEVER GREATER THAN 100.4 F (38 C) OR HIGHER *CHILLS OR SWEATING *NAUSEA AND VOMITING THAT IS NOT CONTROLLED WITH YOUR NAUSEA MEDICATION *UNUSUAL SHORTNESS OF BREATH *UNUSUAL BRUISING OR BLEEDING *URINARY PROBLEMS (pain or burning when urinating, or frequent urination) *BOWEL PROBLEMS (unusual diarrhea, constipation, pain near the anus) TENDERNESS IN MOUTH AND THROAT WITH OR WITHOUT PRESENCE OF ULCERS (sore throat, sores in mouth, or a toothache) UNUSUAL RASH, SWELLING OR PAIN  UNUSUAL VAGINAL DISCHARGE OR ITCHING   Items with * indicate a potential emergency and should be followed  up as soon as possible or go to the Emergency Department if any problems should occur.  Please show the CHEMOTHERAPY ALERT CARD or IMMUNOTHERAPY ALERT CARD at check-in to the Emergency Department and triage nurse.  Should you have questions after your visit or need to cancel or reschedule your appointment, please contact [facility] - A DEPT OF [facility] [phone]  and follow the prompts.  Office hours are 8:00 a.m. to 4:30 p.m. Monday - Friday. Please note that voicemails left after 4:00 p.m. may not be returned until the following business day.  We are closed weekends and major holidays. You have access to a nurse at all times for urgent questions. Please call the main number to the clinic [phone] and follow the prompts.  For any non-urgent questions, you may also contact your provider using MyChart. We now offer e-Visits for anyone 47 and older to request care online for non-urgent symptoms. For details visit [email or web address].   Also download the MyChart app! Go to the app store, search "MyChart", open the app, select [place], and log in with your MyChart username and password.

## 2023-09-19 ENCOUNTER — Encounter: Payer: Self-pay | Admitting: *Deleted

## 2023-09-19 ENCOUNTER — Inpatient Hospital Stay: Payer: No Typology Code available for payment source

## 2023-09-19 VITALS — BP 151/88 | HR 93 | Temp 97.7°F | Resp 18

## 2023-09-19 DIAGNOSIS — C182 Malignant neoplasm of ascending colon: Secondary | ICD-10-CM | POA: Diagnosis not present

## 2023-09-19 MED ORDER — SODIUM CHLORIDE 0.9% FLUSH
10.0000 mL | INTRAVENOUS | Status: DC | PRN
Start: 2023-09-19 — End: 2023-09-19
  Administered 2023-09-19: 10 mL

## 2023-09-19 MED ORDER — HEPARIN SOD (PORK) LOCK FLUSH 100 UNIT/ML IV SOLN
500.0000 [IU] | Freq: Once | INTRAVENOUS | Status: AC | PRN
  Administered 2023-09-19: 500 [IU]

## 2023-09-19 NOTE — Telephone Encounter (Signed)
Replied on MyChart

## 2023-09-19 NOTE — Progress Notes (Signed)
Carlos Miranda presents to have home infusion pump d/c'd and for port-a-cath deaccess with flush.  Portacath located right chest wall accessed with  H 20 needle.  Good blood return present. Portacath flushed with NS and 500U/31ml Heparin, and needle removed intact.  Procedure tolerated well and without incident. Discharged ambulatory in stable condition.

## 2023-09-20 ENCOUNTER — Other Ambulatory Visit: Payer: Self-pay

## 2023-09-20 ENCOUNTER — Encounter: Payer: Self-pay | Admitting: Pulmonary Disease

## 2023-09-20 ENCOUNTER — Other Ambulatory Visit: Payer: Self-pay | Admitting: Pulmonary Disease

## 2023-09-20 MED ORDER — ZOLPIDEM TARTRATE 10 MG PO TABS: 10.0000 mg | ORAL_TABLET | Freq: Every evening | ORAL | 0 refills | Status: AC | PRN

## 2023-09-20 NOTE — Progress Notes (Signed)
Ambien prescription sent to pharmacy ?

## 2023-09-21 ENCOUNTER — Encounter: Payer: Self-pay | Admitting: Neurology
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
## 2023-09-24 ENCOUNTER — Telehealth: Payer: Self-pay | Admitting: Neurology

## 2023-09-24 DIAGNOSIS — G40219 Localization-related (focal) (partial) symptomatic epilepsy and epileptic syndromes with complex partial seizures, intractable, without status epilepticus: Secondary | ICD-10-CM

## 2023-09-24 DIAGNOSIS — R569 Unspecified convulsions: Secondary | ICD-10-CM

## 2023-09-24 NOTE — Telephone Encounter (Signed)
Patient asked is we could call him or send him a MyChart message to let him know we have sent the referral

## 2023-09-24 NOTE — Telephone Encounter (Signed)
Pt is moving out of town and needs a referral faxed to [facility]. The fax number is [phone]

## 2023-09-24 NOTE — Telephone Encounter (Signed)
Referral has been faxed to [facility]. The fax number is [phone]  Pt called an informed

## 2023-09-29 ENCOUNTER — Other Ambulatory Visit: Payer: Self-pay | Admitting: Family Medicine

## 2023-09-29 DIAGNOSIS — E785 Hyperlipidemia, unspecified: Secondary | ICD-10-CM

## 2023-09-30 MED FILL — Fluorouracil IV Soln 2.5 GM/50ML (50 MG/ML): INTRAVENOUS | Qty: 18 | Status: AC

## 2023-09-30 MED FILL — Fluorouracil IV Soln 5 GM/100ML (50 MG/ML): INTRAVENOUS | Qty: 100 | Status: AC

## 2023-10-01 ENCOUNTER — Inpatient Hospital Stay (HOSPITAL_BASED_OUTPATIENT_CLINIC_OR_DEPARTMENT_OTHER): Payer: No Typology Code available for payment source | Admitting: Hematology

## 2023-10-01 ENCOUNTER — Inpatient Hospital Stay: Payer: No Typology Code available for payment source | Attending: Hematology

## 2023-10-01 ENCOUNTER — Inpatient Hospital Stay: Payer: No Typology Code available for payment source

## 2023-10-01 ENCOUNTER — Encounter: Payer: Self-pay | Admitting: Hematology

## 2023-10-01 VITALS — BP 130/85 | HR 96 | Temp 97.8°F | Resp 18

## 2023-10-01 VITALS — BP 149/85 | HR 77 | Temp 97.6°F | Resp 18 | Wt 226.4 lb

## 2023-10-01 DIAGNOSIS — Z1501 Genetic susceptibility to malignant neoplasm of breast: Secondary | ICD-10-CM | POA: Diagnosis not present

## 2023-10-01 DIAGNOSIS — Z880 Allergy status to penicillin: Secondary | ICD-10-CM | POA: Diagnosis not present

## 2023-10-01 DIAGNOSIS — Z803 Family history of malignant neoplasm of breast: Secondary | ICD-10-CM | POA: Diagnosis not present

## 2023-10-01 DIAGNOSIS — C182 Malignant neoplasm of ascending colon: Secondary | ICD-10-CM | POA: Insufficient documentation

## 2023-10-01 DIAGNOSIS — Z5112 Encounter for antineoplastic immunotherapy: Secondary | ICD-10-CM | POA: Diagnosis not present

## 2023-10-01 DIAGNOSIS — R197 Diarrhea, unspecified: Secondary | ICD-10-CM | POA: Insufficient documentation

## 2023-10-01 DIAGNOSIS — Z8249 Family history of ischemic heart disease and other diseases of the circulatory system: Secondary | ICD-10-CM | POA: Insufficient documentation

## 2023-10-01 DIAGNOSIS — Z882 Allergy status to sulfonamides status: Secondary | ICD-10-CM | POA: Insufficient documentation

## 2023-10-01 DIAGNOSIS — Z9049 Acquired absence of other specified parts of digestive tract: Secondary | ICD-10-CM | POA: Diagnosis not present

## 2023-10-01 DIAGNOSIS — Z79630 Long term (current) use of alkylating agent: Secondary | ICD-10-CM | POA: Diagnosis not present

## 2023-10-01 DIAGNOSIS — Z1509 Genetic susceptibility to other malignant neoplasm: Secondary | ICD-10-CM | POA: Diagnosis not present

## 2023-10-01 DIAGNOSIS — R918 Other nonspecific abnormal finding of lung field: Secondary | ICD-10-CM | POA: Insufficient documentation

## 2023-10-01 DIAGNOSIS — Z888 Allergy status to other drugs, medicaments and biological substances status: Secondary | ICD-10-CM | POA: Insufficient documentation

## 2023-10-01 DIAGNOSIS — Z8261 Family history of arthritis: Secondary | ICD-10-CM | POA: Diagnosis not present

## 2023-10-01 DIAGNOSIS — E785 Hyperlipidemia, unspecified: Secondary | ICD-10-CM | POA: Diagnosis not present

## 2023-10-01 DIAGNOSIS — Z79634 Long term (current) use of topoisomerase inhibitor: Secondary | ICD-10-CM | POA: Diagnosis not present

## 2023-10-01 DIAGNOSIS — Z5986 Financial insecurity: Secondary | ICD-10-CM | POA: Insufficient documentation

## 2023-10-01 DIAGNOSIS — Z79899 Other long term (current) drug therapy: Secondary | ICD-10-CM | POA: Diagnosis not present

## 2023-10-01 DIAGNOSIS — R519 Headache, unspecified: Secondary | ICD-10-CM | POA: Diagnosis not present

## 2023-10-01 DIAGNOSIS — I10 Essential (primary) hypertension: Secondary | ICD-10-CM | POA: Diagnosis not present

## 2023-10-01 DIAGNOSIS — C786 Secondary malignant neoplasm of retroperitoneum and peritoneum: Secondary | ICD-10-CM | POA: Diagnosis not present

## 2023-10-01 DIAGNOSIS — Z8379 Family history of other diseases of the digestive system: Secondary | ICD-10-CM | POA: Diagnosis not present

## 2023-10-01 DIAGNOSIS — Z95828 Presence of other vascular implants and grafts: Secondary | ICD-10-CM

## 2023-10-01 LAB — COMPREHENSIVE METABOLIC PANEL
ALT: 27 U/L (ref 0–44)
AST: 28 U/L (ref 15–41)
Albumin: 3.8 g/dL (ref 3.5–5.0)
Alkaline Phosphatase: 115 U/L (ref 38–126)
Anion gap: 9 (ref 5–15)
BUN: 16 mg/dL (ref 8–23)
CO2: 24 mmol/L (ref 22–32)
Calcium: 8.9 mg/dL (ref 8.9–10.3)
Chloride: 107 mmol/L (ref 98–111)
Creatinine, Ser: 0.88 mg/dL (ref 0.61–1.24)
GFR, Estimated: >60 mL/min (ref >60)
Glucose, Bld: 131 mg/dL — ABNORMAL HIGH (ref 70–99)
Potassium: 3.7 mmol/L (ref 3.5–5.1)
Sodium: 140 mmol/L (ref 135–145)
Total Bilirubin: 0.4 mg/dL (ref <1.2)
Total Protein: 7.1 g/dL (ref 6.5–8.1)

## 2023-10-01 LAB — CBC WITH DIFFERENTIAL/PLATELET
Abs Immature Granulocytes: 0.01 10*3/uL (ref 0.00–0.07)
Basophils Absolute: 0 10*3/uL (ref 0.0–0.1)
Basophils Relative: 1 %
Eosinophils Absolute: 0.3 10*3/uL (ref 0.0–0.5)
Eosinophils Relative: 7 %
HCT: 45.8 % (ref 39.0–52.0)
Hemoglobin: 15.3 g/dL (ref 13.0–17.0)
Immature Granulocytes: 0 %
Lymphocytes Relative: 38 %
Lymphs Abs: 1.4 10*3/uL (ref 0.7–4.0)
MCH: 31.2 pg (ref 26.0–34.0)
MCHC: 33.4 g/dL (ref 30.0–36.0)
MCV: 93.3 fL (ref 80.0–100.0)
Monocytes Absolute: 0.3 10*3/uL (ref 0.1–1.0)
Monocytes Relative: 9 %
Neutro Abs: 1.7 10*3/uL (ref 1.7–7.7)
Neutrophils Relative %: 45 %
Platelets: 161 10*3/uL (ref 150–400)
RBC: 4.91 MIL/uL (ref 4.22–5.81)
RDW: 15.2 % (ref 11.5–15.5)
WBC: 3.7 10*3/uL — ABNORMAL LOW (ref 4.0–10.5)
nRBC: 0 % (ref 0.0–0.2)

## 2023-10-01 LAB — MAGNESIUM: Magnesium: 2.1 mg/dL (ref 1.7–2.4)

## 2023-10-01 MED ORDER — ATROPINE SULFATE 1 MG/ML IV SOLN
0.5000 mg | Freq: Once | INTRAVENOUS | Status: AC
  Administered 2023-10-01: 0.5 mg via INTRAVENOUS
  Filled 2023-10-01: qty 1

## 2023-10-01 MED ORDER — DEXAMETHASONE SODIUM PHOSPHATE 10 MG/ML IJ SOLN
10.0000 mg | Freq: Once | INTRAMUSCULAR | Status: AC
Start: 2023-10-01 — End: 2023-10-01
  Administered 2023-10-01: 10 mg via INTRAVENOUS
  Filled 2023-10-01: qty 1

## 2023-10-01 MED ORDER — SODIUM CHLORIDE 0.9 % IV SOLN
400.0000 mg/m2 | Freq: Once | INTRAVENOUS | Status: AC
  Administered 2023-10-01: 884 mg via INTRAVENOUS
  Filled 2023-10-01: qty 44.2

## 2023-10-01 MED ORDER — SODIUM CHLORIDE 0.9 % IV SOLN
180.0000 mg/m2 | Freq: Once | INTRAVENOUS | Status: AC
  Administered 2023-10-01: 400 mg via INTRAVENOUS
  Filled 2023-10-01: qty 20

## 2023-10-01 MED ORDER — SODIUM CHLORIDE 0.9 % IV SOLN: INTRAVENOUS | Status: DC

## 2023-10-01 MED ORDER — PALONOSETRON HCL INJECTION 0.25 MG/5ML
0.2500 mg | Freq: Once | INTRAVENOUS | Status: AC
  Administered 2023-10-01: 0.25 mg via INTRAVENOUS
  Filled 2023-10-01: qty 5

## 2023-10-01 MED ORDER — SODIUM CHLORIDE 0.9% FLUSH
10.0000 mL | INTRAVENOUS | Status: DC | PRN
  Administered 2023-10-01: 10 mL via INTRAVENOUS

## 2023-10-01 MED ORDER — SODIUM CHLORIDE 0.9 % IV SOLN
5.0000 mg/kg | Freq: Once | INTRAVENOUS | Status: AC
  Administered 2023-10-01: 500 mg via INTRAVENOUS
  Filled 2023-10-01: qty 16

## 2023-10-01 MED ORDER — SODIUM CHLORIDE 0.9 % IV SOLN: 10.0000 mg | Freq: Once | INTRAVENOUS | Status: DC

## 2023-10-01 MED ORDER — FLUOROURACIL CHEMO INJECTION 2.5 GM/50ML
400.0000 mg/m2 | Freq: Once | INTRAVENOUS | Status: AC
Start: 2023-10-01 — End: 2023-10-01
  Administered 2023-10-01: 900 mg via INTRAVENOUS
  Filled 2023-10-01: qty 18

## 2023-10-01 MED ORDER — SODIUM CHLORIDE 0.9 % IV SOLN
2400.0000 mg/m2 | INTRAVENOUS | Status: DC
  Administered 2023-10-01: 5000 mg via INTRAVENOUS
  Filled 2023-10-01: qty 100

## 2023-10-01 NOTE — Progress Notes (Signed)
Patients port flushed without difficulty.  Good blood return noted with no bruising or swelling noted at site.  Patient remains accessed for treatment.  

## 2023-10-01 NOTE — Patient Instructions (Signed)

## 2023-10-01 NOTE — Progress Notes (Signed)
Patient has been examined by Dr. Katragadda. Vital signs and labs have been reviewed by MD - ANC, Creatinine, LFTs, hemoglobin, and platelets are within treatment parameters per M.D. - pt may proceed with treatment.  Primary RN and pharmacy notified.  

## 2023-10-01 NOTE — Progress Notes (Signed)
Patient tolerated chemotherapy with no complaints voiced.  Side effects with management reviewed with understanding verbalized.  Port site clean and dry with no bruising or swelling noted at site.  Good blood return noted before and after administration of chemotherapy. 5FU pump started for home use pt due to return to clinic 10/03/23.  Patient left in satisfactory condition with VSS and no s/s of distress noted. All follow ups as scheduled.   Carlos Miranda [employer]

## 2023-10-01 NOTE — Patient Instructions (Signed)
[facility] [place] - [facility]  Discharge Instructions: Thank you for choosing [facility] to provide your oncology and hematology care.  If you have a lab appointment with the Cancer Center - please note that after April 8th, 2024, all labs will be drawn in the cancer center.  You do not have to check in or register with the main entrance as you have in the past but will complete your check-in in the cancer center.  Wear comfortable clothing and clothing appropriate for easy access to any Portacath or PICC line.   We strive to give you quality time with your provider. You may need to reschedule your appointment if you arrive late (15 or more minutes).  Arriving late affects you and other patients whose appointments are after yours.  Also, if you miss three or more appointments without notifying the office, you may be dismissed from the clinic at the provider's discretion.      For prescription refill requests, have your pharmacy contact our office and allow 72 hours for refills to be completed.    Today you received the following chemotherapy and/or immunotherapy agents Irinotecan & Adrucil & Avastin    To help prevent nausea and vomiting after your treatment, we encourage you to take your nausea medication as directed.  BELOW ARE SYMPTOMS THAT SHOULD BE REPORTED IMMEDIATELY: *FEVER GREATER THAN 100.4 F (38 C) OR HIGHER *CHILLS OR SWEATING *NAUSEA AND VOMITING THAT IS NOT CONTROLLED WITH YOUR NAUSEA MEDICATION *UNUSUAL SHORTNESS OF BREATH *UNUSUAL BRUISING OR BLEEDING *URINARY PROBLEMS (pain or burning when urinating, or frequent urination) *BOWEL PROBLEMS (unusual diarrhea, constipation, pain near the anus) TENDERNESS IN MOUTH AND THROAT WITH OR WITHOUT PRESENCE OF ULCERS (sore throat, sores in mouth, or a toothache) UNUSUAL RASH, SWELLING OR PAIN  UNUSUAL VAGINAL DISCHARGE OR ITCHING   Items with * indicate a potential emergency and  should be followed up as soon as possible or go to the Emergency Department if any problems should occur.  Please show the CHEMOTHERAPY ALERT CARD or IMMUNOTHERAPY ALERT CARD at check-in to the Emergency Department and triage nurse.  Should you have questions after your visit or need to cancel or reschedule your appointment, please contact [facility]  - A DEPT OF Eligha Bridegroom [facility] [phone]  and follow the prompts.  Office hours are 8:00 a.m. to 4:30 p.m. Monday - Friday. Please note that voicemails left after 4:00 p.m. may not be returned until the following business day.  We are closed weekends and major holidays. You have access to a nurse at all times for urgent questions. Please call the main number to the clinic [phone] and follow the prompts.  For any non-urgent questions, you may also contact your provider using MyChart. We now offer e-Visits for anyone 37 and older to request care online for non-urgent symptoms. For details visit [email or web address].   Also download the MyChart app! Go to the app store, search "MyChart", open the app, select [place], and log in with your MyChart username and password.

## 2023-10-03 ENCOUNTER — Encounter: Payer: Self-pay | Admitting: Hematology

## 2023-10-03 ENCOUNTER — Inpatient Hospital Stay: Payer: No Typology Code available for payment source

## 2023-10-03 VITALS — BP 130/88 | HR 80 | Temp 98.5°F | Resp 18

## 2023-10-03 DIAGNOSIS — C182 Malignant neoplasm of ascending colon: Secondary | ICD-10-CM | POA: Diagnosis not present

## 2023-10-03 MED ORDER — HEPARIN SOD (PORK) LOCK FLUSH 100 UNIT/ML IV SOLN
500.0000 [IU] | Freq: Once | INTRAVENOUS | Status: AC | PRN
  Administered 2023-10-03: 500 [IU]

## 2023-10-03 MED ORDER — SODIUM CHLORIDE 0.9% FLUSH
10.0000 mL | INTRAVENOUS | Status: DC | PRN
  Administered 2023-10-03: 10 mL

## 2023-10-03 NOTE — Progress Notes (Signed)
Patient presents today for 5FU pump stop and disconnection after 46 hour continous infusion.   5FU pump deaccessed.  Patients port flushed without difficulty.  Good blood return noted with no bruising or swelling noted at site.  Needle removed intact.  Band aid applied.  VSS with discharge and left in satisfactory condition via wheelchair with no s/s of distress noted.    

## 2023-10-03 NOTE — Patient Instructions (Signed)
[facility] [place] [facility]  Discharge Instructions: Thank you for choosing [facility] to provide your oncology and hematology care.  If you have a lab appointment with the Cancer Center - please note that after April 8th, 2024, all labs will be drawn in the cancer center.  You do not have to check in or register with the main entrance as you have in the past but will complete your check-in in the cancer center.  Wear comfortable clothing and clothing appropriate for easy access to any Portacath or PICC line.   We strive to give you quality time with your provider. You may need to reschedule your appointment if you arrive late (15 or more minutes).  Arriving late affects you and other patients whose appointments are after yours.  Also, if you miss three or more appointments without notifying the office, you may be dismissed from the clinic at the provider's discretion.      For prescription refill requests, have your pharmacy contact our office and allow 72 hours for refills to be completed.    Today you received the following chemotherapy and/or immunotherapy agents 5FU pump stop      To help prevent nausea and vomiting after your treatment, we encourage you to take your nausea medication as directed.  BELOW ARE SYMPTOMS THAT SHOULD BE REPORTED IMMEDIATELY: *FEVER GREATER THAN 100.4 F (38 C) OR HIGHER *CHILLS OR SWEATING *NAUSEA AND VOMITING THAT IS NOT CONTROLLED WITH YOUR NAUSEA MEDICATION *UNUSUAL SHORTNESS OF BREATH *UNUSUAL BRUISING OR BLEEDING *URINARY PROBLEMS (pain or burning when urinating, or frequent urination) *BOWEL PROBLEMS (unusual diarrhea, constipation, pain near the anus) TENDERNESS IN MOUTH AND THROAT WITH OR WITHOUT PRESENCE OF ULCERS (sore throat, sores in mouth, or a toothache) UNUSUAL RASH, SWELLING OR PAIN  UNUSUAL VAGINAL DISCHARGE OR ITCHING   Items with * indicate a potential emergency and should be  followed up as soon as possible or go to the Emergency Department if any problems should occur.  Please show the CHEMOTHERAPY ALERT CARD or IMMUNOTHERAPY ALERT CARD at check-in to the Emergency Department and triage nurse.  Should you have questions after your visit or need to cancel or reschedule your appointment, please contact [facility] [place] - A DEPT OF [facility] [phone]  and follow the prompts.  Office hours are 8:00 a.m. to 4:30 p.m. Monday - Friday. Please note that voicemails left after 4:00 p.m. may not be returned until the following business day.  We are closed weekends and major holidays. You have access to a nurse at all times for urgent questions. Please call the main number to the clinic [phone] and follow the prompts.  For any non-urgent questions, you may also contact your provider using MyChart. We now offer e-Visits for anyone 27 and older to request care online for non-urgent symptoms. For details visit [email or web address].   Also download the MyChart app! Go to the app store, search "MyChart", open the app, select [place], and log in with your MyChart username and password.

## 2023-10-05 ENCOUNTER — Emergency Department (HOSPITAL_COMMUNITY)
Admission: EM | Admit: 2023-10-05 | Discharge: 2023-10-05 | Disposition: A | Discharge status: Home / Self Care | Payer: No Typology Code available for payment source | Attending: Emergency Medicine | Admitting: Emergency Medicine

## 2023-10-05 ENCOUNTER — Other Ambulatory Visit: Payer: Self-pay

## 2023-10-05 ENCOUNTER — Encounter (HOSPITAL_COMMUNITY): Payer: Self-pay

## 2023-10-05 DIAGNOSIS — Z79899 Other long term (current) drug therapy: Secondary | ICD-10-CM | POA: Diagnosis not present

## 2023-10-05 DIAGNOSIS — H6501 Acute serous otitis media, right ear: Secondary | ICD-10-CM | POA: Insufficient documentation

## 2023-10-05 DIAGNOSIS — H6123 Impacted cerumen, bilateral: Secondary | ICD-10-CM | POA: Insufficient documentation

## 2023-10-05 DIAGNOSIS — I10 Essential (primary) hypertension: Secondary | ICD-10-CM | POA: Diagnosis not present

## 2023-10-05 DIAGNOSIS — H9201 Otalgia, right ear: Secondary | ICD-10-CM | POA: Diagnosis present

## 2023-10-05 NOTE — ED Triage Notes (Signed)
Pt reports:  "I can't hear out of my left ear" This morning worsening Sinus problems

## 2023-10-05 NOTE — ED Provider Notes (Signed)
[place] EMERGENCY DEPARTMENT AT [facility] Provider Note   CSN: [id number] Arrival date & time: 10/05/23  1546     History  Chief Complaint  Patient presents with   Ear Fullness    Carlos Miranda is a 62 y.o. male.  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  He states 4 days ago he developed some frontal and maxillary sinus congestion which caused some headaches.  This has improved, however when he woke up this morning he noticed a muffled hearing to the right ear.  This is bothersome to him as he is deaf in his left ear.  He denies any ear pain or drainage.  He states he tried using some hydrogen peroxide and taking a hot shower but this did not improve his symptoms.  He reports feeling like he is listening underwater.  He reports a popping sensation when he opens his jaw.  He denies any headaches.  He denies tinnitus.   Ear Fullness       Home Medications Prior to Admission medications   Medication Sig Start Date End Date Taking? Authorizing Provider  albuterol (VENTOLIN HFA) 108 (90 Base) MCG/ACT inhaler Inhale into the lungs. 02/09/23 02/09/24  [provider]  albuterol (VENTOLIN HFA) 108 (90 Base) MCG/ACT inhaler Inhale 2 puffs into the lungs every 6 (six) hours as needed for wheezing or shortness of breath. 07/03/23   Tomma Lightning, MD  Cenobamate (XCOPRI) 100 MG TABS TAKE ONE TABLET BY MOUTH ONCE EVERY NIGHT 08/31/23   Van Clines, MD  clindamycin (CLINDAGEL) 1 % gel Apply topically 2 (two) times daily. 06/01/23   [name], MD  doxycycline (VIBRA-TABS) 100 MG tablet TAKE 1 TABLET BY MOUTH TWICE DAILY AS NEEDED FOR ACNEFORM RASH 07/16/23   [name], MD  fluticasone (FLONASE) 50 MCG/ACT nasal spray SPRAY 1 SPRAY INTO BOTH NOSTRILS DAILY. 03/22/23   Virl Diamond A, MD  ibuprofen (ADVIL) 600 MG tablet Take 1  tablet (600 mg total) by mouth every 8 (eight) hours as needed. 04/28/22   Daryll Drown, NP  LamoTRIgine 300 MG TB24 24 hour tablet Take 1 tablet every night 08/31/23   Van Clines, MD  lisinopril (ZESTRIL) 10 MG tablet Take 1 tablet (10 mg total) by mouth daily. 08/15/23   Dettinger, Elige Radon, MD  magnesium oxide (MAG-OX) 400 (240 Mg) MG tablet Take 2 tablets (800 mg total) by mouth in the morning, at noon, and at bedtime. 05/16/23   [name], MD  OXcarbazepine ER (OXTELLAR XR) 600 MG TB24 TAKE ONE TABLET EVERY EVENING 08/31/23   Van Clines, MD  pravastatin (PRAVACHOL) 40 MG tablet Take 1 tablet by mouth once daily 10/01/23   Dettinger, Elige Radon, MD  prochlorperazine (COMPAZINE) 10 MG tablet Take 1 tablet (10 mg total) by mouth every 6 (six) hours as needed for nausea or vomiting. 01/30/23   [name], MD  tadalafil (CIALIS) 20 MG tablet Take 1 tablet (20 mg total) by mouth daily as needed for erectile dysfunction. 08/15/23   Dettinger, Elige Radon, MD  triamcinolone 0.025%-Cerave equivalent 1:1 cream mixture Apply topically 2 (two) times daily. 02/02/23   [name], MD  zolpidem (AMBIEN) 10 MG tablet Take 1 tablet (10 mg total) by mouth at bedtime as needed for sleep. 09/20/23 10/20/23  Tomma Lightning, MD      Allergies  Penicillins, Furosemide, Streptomycin, and Sulfa antibiotics    Review of Systems   Review of Systems  HENT:  Positive for hearing loss.   All other systems reviewed and are negative.   Physical Exam Updated Vital Signs BP (!) 142/83 (BP Location: Right Arm)   Pulse 100   Temp 98.7 F (37.1 C) (Oral)   Resp 18   Ht 5\' 8"  (1.727 m)   Wt 102.5 kg   SpO2 96%   BMI 34.36 kg/m  Physical Exam Vitals and nursing note reviewed.  Constitutional:      General: He is not in acute distress.    Appearance: Normal appearance. He is normal weight. He is not ill-appearing.  HENT:     Head: Normocephalic and atraumatic.     Ears:      Comments: Left TM with cerumen impaction.  Right TM with mild amount of soft cerumen.  Serous effusion appreciated to right TM.  No erythema.  No tragal tenderness or mastoid tenderness or erythema.  Hearing intact in right ear Pulmonary:     Effort: Pulmonary effort is normal. No respiratory distress.  Abdominal:     General: Abdomen is flat.  Musculoskeletal:        General: Normal range of motion.     Cervical back: Neck supple.  Skin:    General: Skin is warm and dry.  Neurological:     Mental Status: He is alert and oriented to person, place, and time.  Psychiatric:        Mood and Affect: Mood normal.        Behavior: Behavior normal.     ED Results / Procedures / Treatments   Labs (all labs ordered are listed, but only abnormal results are displayed) Labs Reviewed - No data to display  EKG None  Radiology No results found.  Procedures Procedures    Medications Ordered in ED Medications - No data to display  ED Course/ Medical Decision Making/ A&P                                 Medical Decision Making This patient presents to the ED for concern of muffled hearing, this involves an extensive number of treatment options, and is a complaint that carries with it a high risk of complications and morbidity.  The differential diagnosis includes otitis media, otitis externa, cholesteatoma, stroke  Additional history obtained from: Nursing notes from this visit.  Afebrile, hemodynamically stable.  62 year old male presenting for evaluation of muffled hearing to the right ear.  This began this morning.  Currently has maxillary and frontal sinus congestion and rhinorrhea.  Denies any pain.  There is a serous effusion on exam.  This is likely the cause of his symptoms.  He was encouraged to take Claritin, Flonase, Mucinex for his symptoms.  He was encouraged to follow-up in 1 week with his primary care provider for reevaluation.  He was given return precautions.  Stable at  discharge.  At this time there does not appear to be any evidence of an acute emergency medical condition and the patient appears stable for discharge with appropriate outpatient follow up. Diagnosis was discussed with patient who verbalizes understanding of care plan and is agreeable to discharge. I have discussed return precautions with patient who verbalizes understanding. Patient encouraged to follow-up with their PCP within 1 week. All questions answered.  Note: Portions of this report may have been  transcribed using voice recognition software. Every effort was made to ensure accuracy; however, inadvertent computerized transcription errors may still be present.         Final Clinical Impression(s) / ED Diagnoses Final diagnoses:  Non-recurrent acute serous otitis media of right ear    Rx / DC Orders ED Discharge Orders     None         Mora Bellman 10/05/23 1633    Bethann Berkshire, MD 10/07/23 1230

## 2023-10-05 NOTE — Discharge Instructions (Addendum)
You have been seen today for your complaint of muffled hearing in the right ear.  This is likely due to the fluid behind the ear membrane. Your discharge medications include Claritin.  Take the 12-hour Claritin twice daily.  Flonase.  Continue taking this, 1 spray in each nostril daily.  Mucinex.  Continue taking this as needed for congestion. Follow up with: Your primary care provider in 1 week for reevaluation of your symptoms Please seek immediate medical care if you develop any of the following symptoms: You develop a severe headache. You completely lose hearing in the affected ear. You have bleeding from your ear canal. You have sudden and severe pain in your ear. At this time there does not appear to be the presence of an emergent medical condition, however there is always the potential for conditions to change. Please read and follow the below instructions.  Do not take your medicine if  develop an itchy rash, swelling in your mouth or lips, or difficulty breathing; call 911 and seek immediate emergency medical attention if this occurs.  You may review your lab tests and imaging results in their entirety on your MyChart account.  Please discuss all results of fully with your primary care provider and other specialist at your follow-up visit.  Note: Portions of this text may have been transcribed using voice recognition software. Every effort was made to ensure accuracy; however, inadvertent computerized transcription errors may still be present.

## 2023-10-06 ENCOUNTER — Encounter: Payer: Self-pay | Admitting: Hematology

## 2023-10-08 ENCOUNTER — Other Ambulatory Visit: Payer: Self-pay | Admitting: Family Medicine

## 2023-10-08 DIAGNOSIS — I10 Essential (primary) hypertension: Secondary | ICD-10-CM

## 2023-10-12 ENCOUNTER — Other Ambulatory Visit: Payer: Self-pay | Admitting: Oncology
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
## 2023-10-15 ENCOUNTER — Inpatient Hospital Stay: Payer: No Typology Code available for payment source

## 2023-10-15 ENCOUNTER — Inpatient Hospital Stay (HOSPITAL_BASED_OUTPATIENT_CLINIC_OR_DEPARTMENT_OTHER): Payer: No Typology Code available for payment source | Admitting: Hematology

## 2023-10-15 VITALS — BP 134/81 | HR 96 | Temp 97.7°F | Resp 18

## 2023-10-15 VITALS — BP 147/82 | HR 98 | Temp 97.7°F | Resp 18 | Wt 224.0 lb

## 2023-10-15 DIAGNOSIS — C182 Malignant neoplasm of ascending colon: Secondary | ICD-10-CM

## 2023-10-15 DIAGNOSIS — Z95828 Presence of other vascular implants and grafts: Secondary | ICD-10-CM

## 2023-10-15 LAB — URINALYSIS, DIPSTICK ONLY
Bilirubin Urine: NEGATIVE
Glucose, UA: NEGATIVE mg/dL
Hgb urine dipstick: NEGATIVE
Ketones, ur: NEGATIVE mg/dL
Leukocytes,Ua: NEGATIVE
Nitrite: NEGATIVE
Protein, ur: NEGATIVE mg/dL
Specific Gravity, Urine: 1.028 (ref 1.005–1.030)
pH: 5 (ref 5.0–8.0)

## 2023-10-15 LAB — CBC WITH DIFFERENTIAL/PLATELET
Abs Immature Granulocytes: 0.01 10*3/uL (ref 0.00–0.07)
Basophils Absolute: 0 10*3/uL (ref 0.0–0.1)
Basophils Relative: 1 %
Eosinophils Absolute: 0.4 10*3/uL (ref 0.0–0.5)
Eosinophils Relative: 10 %
HCT: 45.1 % (ref 39.0–52.0)
Hemoglobin: 15.3 g/dL (ref 13.0–17.0)
Immature Granulocytes: 0 %
Lymphocytes Relative: 40 %
Lymphs Abs: 1.6 10*3/uL (ref 0.7–4.0)
MCH: 31.7 pg (ref 26.0–34.0)
MCHC: 33.9 g/dL (ref 30.0–36.0)
MCV: 93.6 fL (ref 80.0–100.0)
Monocytes Absolute: 0.3 10*3/uL (ref 0.1–1.0)
Monocytes Relative: 8 %
Neutro Abs: 1.6 10*3/uL — ABNORMAL LOW (ref 1.7–7.7)
Neutrophils Relative %: 41 %
Platelets: 172 10*3/uL (ref 150–400)
RBC: 4.82 MIL/uL (ref 4.22–5.81)
RDW: 16 % — ABNORMAL HIGH (ref 11.5–15.5)
WBC: 4 10*3/uL (ref 4.0–10.5)
nRBC: 0 % (ref 0.0–0.2)

## 2023-10-15 LAB — COMPREHENSIVE METABOLIC PANEL
ALT: 24 U/L (ref 0–44)
AST: 23 U/L (ref 15–41)
Albumin: 3.9 g/dL (ref 3.5–5.0)
Alkaline Phosphatase: 111 U/L (ref 38–126)
Anion gap: 10 (ref 5–15)
BUN: 14 mg/dL (ref 8–23)
CO2: 23 mmol/L (ref 22–32)
Calcium: 9.1 mg/dL (ref 8.9–10.3)
Chloride: 106 mmol/L (ref 98–111)
Creatinine, Ser: 0.92 mg/dL (ref 0.61–1.24)
GFR, Estimated: >60 mL/min (ref >60)
Glucose, Bld: 129 mg/dL — ABNORMAL HIGH (ref 70–99)
Potassium: 3.7 mmol/L (ref 3.5–5.1)
Sodium: 139 mmol/L (ref 135–145)
Total Bilirubin: 0.4 mg/dL (ref <1.2)
Total Protein: 7.2 g/dL (ref 6.5–8.1)

## 2023-10-15 LAB — MAGNESIUM: Magnesium: 2 mg/dL (ref 1.7–2.4)

## 2023-10-15 MED ORDER — BEVACIZUMAB-AWWB CHEMO INJECTION 400 MG/16ML
5.0000 mg/kg | Freq: Once | INTRAVENOUS | Status: AC
  Administered 2023-10-15: 500 mg via INTRAVENOUS
  Filled 2023-10-15: qty 16

## 2023-10-15 MED ORDER — DEXAMETHASONE SODIUM PHOSPHATE 10 MG/ML IJ SOLN
10.0000 mg | Freq: Once | INTRAMUSCULAR | Status: AC
  Administered 2023-10-15: 10 mg via INTRAVENOUS
  Filled 2023-10-15: qty 1

## 2023-10-15 MED ORDER — SODIUM CHLORIDE 0.9% FLUSH: 10.0000 mL | INTRAVENOUS | Status: DC | PRN

## 2023-10-15 MED ORDER — SODIUM CHLORIDE 0.9% FLUSH
10.0000 mL | INTRAVENOUS | Status: DC | PRN
  Administered 2023-10-15: 10 mL via INTRAVENOUS

## 2023-10-15 MED ORDER — FLUOROURACIL CHEMO INJECTION 2.5 GM/50ML
400.0000 mg/m2 | Freq: Once | INTRAVENOUS | Status: AC
  Administered 2023-10-15: 900 mg via INTRAVENOUS
  Filled 2023-10-15: qty 18

## 2023-10-15 MED ORDER — SODIUM CHLORIDE 0.9 % IV SOLN
400.0000 mg/m2 | Freq: Once | INTRAVENOUS | Status: AC
  Administered 2023-10-15: 884 mg via INTRAVENOUS
  Filled 2023-10-15: qty 44.2

## 2023-10-15 MED ORDER — SODIUM CHLORIDE 0.9 % IV SOLN
2400.0000 mg/m2 | INTRAVENOUS | Status: DC
  Administered 2023-10-15: 5000 mg via INTRAVENOUS
  Filled 2023-10-15: qty 100

## 2023-10-15 MED ORDER — PALONOSETRON HCL INJECTION 0.25 MG/5ML
0.2500 mg | Freq: Once | INTRAVENOUS | Status: AC
  Administered 2023-10-15: 0.25 mg via INTRAVENOUS
  Filled 2023-10-15: qty 5

## 2023-10-15 MED ORDER — HEPARIN SOD (PORK) LOCK FLUSH 100 UNIT/ML IV SOLN: 500.0000 [IU] | Freq: Once | INTRAVENOUS | Status: DC | PRN

## 2023-10-15 MED ORDER — SODIUM CHLORIDE 0.9 % IV SOLN
180.0000 mg/m2 | Freq: Once | INTRAVENOUS | Status: AC
  Administered 2023-10-15: 400 mg via INTRAVENOUS
  Filled 2023-10-15: qty 20

## 2023-10-15 MED ORDER — SODIUM CHLORIDE 0.9 % IV SOLN: INTRAVENOUS | Status: DC

## 2023-10-15 NOTE — Progress Notes (Signed)
Patient has been examined by Dr. Katragadda. Vital signs and labs have been reviewed by MD - ANC, Creatinine, LFTs, hemoglobin, and platelets are within treatment parameters per M.D. - pt may proceed with treatment.  Primary RN and pharmacy notified.  

## 2023-10-15 NOTE — Progress Notes (Signed)
Patients port flushed without difficulty.  Good blood return noted with no bruising or swelling noted at site.  Patient remains accessed for treatment.  

## 2023-10-15 NOTE — Patient Instructions (Addendum)
[facility] at [facility] Discharge Instructions   You were seen and examined today by Dr. Ellin Saba.  He reviewed the results of your lab work which are normal/stable.   We will proceed with your treatment today.   Return as needed.    Thank you for choosing [facility] at [facility] to provide your oncology and hematology care.  To afford each patient quality time with our provider, please arrive at least 15 minutes before your scheduled appointment time.   If you have a lab appointment with the Cancer Center please come in thru the Main Entrance and check in at the main information desk.  You need to re-schedule your appointment should you arrive 10 or more minutes late.  We strive to give you quality time with our providers, and arriving late affects you and other patients whose appointments are after yours.  Also, if you no show three or more times for appointments you may be dismissed from the clinic at the providers discretion.     Again, thank you for choosing [facility].  Our hope is that these requests will decrease the amount of time that you wait before being seen by our physicians.       _____________________________________________________________  Should you have questions after your visit to [facility], please contact our office at [phone] and follow the prompts.  Our office hours are 8:00 a.m. and 4:30 p.m. Monday - Friday.  Please note that voicemails left after 4:00 p.m. may not be returned until the following business day.  We are closed weekends and major holidays.  You do have access to a nurse 24-7, just call the main number to the clinic [phone] and do not press any options, hold on the line and a nurse will answer the phone.    For prescription refill requests, have your pharmacy contact our office and allow 72 hours.    Due to Covid, you will need to wear a mask upon entering the  hospital. If you do not have a mask, a mask will be given to you at the Main Entrance upon arrival. For doctor visits, patients may have 1 support person age 49 or older with them. For treatment visits, patients can not have anyone with them due to social distancing guidelines and our immunocompromised population.

## 2023-10-15 NOTE — Progress Notes (Signed)
Patient presents today for MVASI/irinotecan/leucovorin/with 5FU pump start per providers order.  Vital signs and labs reviewed by MD.  Message received from Chapman Moss RN/Dr. Ellin Saba patient okay for treatment.  Treatment given today per MD orders.  Stable during infusion without adverse affects.  Vital signs stable.  5FU pump connected and verified RUN on the screen with the patient.  No complaints at this time.  Discharge from clinic ambulatory in stable condition.  Alert and oriented X 3.  Follow up with [facility] as scheduled.

## 2023-10-15 NOTE — Patient Instructions (Signed)
[facility] [place] - A DEPT OF [facility]  Discharge Instructions: Thank you for choosing [facility] to provide your oncology and hematology care.  If you have a lab appointment with the Cancer Center - please note that after April 8th, 2024, all labs will be drawn in the cancer center.  You do not have to check in or register with the main entrance as you have in the past but will complete your check-in in the cancer center.  Wear comfortable clothing and clothing appropriate for easy access to any Portacath or PICC line.   We strive to give you quality time with your provider. You may need to reschedule your appointment if you arrive late (15 or more minutes).  Arriving late affects you and other patients whose appointments are after yours.  Also, if you miss three or more appointments without notifying the office, you may be dismissed from the clinic at the provider's discretion.      For prescription refill requests, have your pharmacy contact our office and allow 72 hours for refills to be completed.    Today you received the following chemotherapy and/or immunotherapy agents Fofiri with 5FU pump start      To help prevent nausea and vomiting after your treatment, we encourage you to take your nausea medication as directed.  BELOW ARE SYMPTOMS THAT SHOULD BE REPORTED IMMEDIATELY: *FEVER GREATER THAN 100.4 F (38 C) OR HIGHER *CHILLS OR SWEATING *NAUSEA AND VOMITING THAT IS NOT CONTROLLED WITH YOUR NAUSEA MEDICATION *UNUSUAL SHORTNESS OF BREATH *UNUSUAL BRUISING OR BLEEDING *URINARY PROBLEMS (pain or burning when urinating, or frequent urination) *BOWEL PROBLEMS (unusual diarrhea, constipation, pain near the anus) TENDERNESS IN MOUTH AND THROAT WITH OR WITHOUT PRESENCE OF ULCERS (sore throat, sores in mouth, or a toothache) UNUSUAL RASH, SWELLING OR PAIN  UNUSUAL VAGINAL DISCHARGE OR ITCHING   Items with * indicate a potential emergency and  should be followed up as soon as possible or go to the Emergency Department if any problems should occur.  Please show the CHEMOTHERAPY ALERT CARD or IMMUNOTHERAPY ALERT CARD at check-in to the Emergency Department and triage nurse.  Should you have questions after your visit or need to cancel or reschedule your appointment, please contact [facility] [place] - A DEPT OF [facility] [phone]  and follow the prompts.  Office hours are 8:00 a.m. to 4:30 p.m. Monday - Friday. Please note that voicemails left after 4:00 p.m. may not be returned until the following business day.  We are closed weekends and major holidays. You have access to a nurse at all times for urgent questions. Please call the main number to the clinic [phone] and follow the prompts.  For any non-urgent questions, you may also contact your provider using MyChart. We now offer e-Visits for anyone 86 and older to request care online for non-urgent symptoms. For details visit [email or web address].   Also download the MyChart app! Go to the app store, search "MyChart", open the app, select [address], and log in with your MyChart username and password.

## 2023-10-17 ENCOUNTER — Inpatient Hospital Stay: Payer: No Typology Code available for payment source

## 2023-10-17 VITALS — BP 137/95 | HR 93 | Temp 97.0°F | Resp 18

## 2023-10-17 DIAGNOSIS — C182 Malignant neoplasm of ascending colon: Secondary | ICD-10-CM

## 2023-10-17 MED ORDER — SODIUM CHLORIDE 0.9% FLUSH
10.0000 mL | INTRAVENOUS | Status: DC | PRN
Start: 2023-10-17 — End: 2023-10-17
  Administered 2023-10-17: 10 mL

## 2023-10-17 MED ORDER — HEPARIN SOD (PORK) LOCK FLUSH 100 UNIT/ML IV SOLN
500.0000 [IU] | Freq: Once | INTRAVENOUS | Status: AC | PRN
Start: 2023-10-17 — End: 2023-10-17
  Administered 2023-10-17: 500 [IU]

## 2023-10-17 NOTE — Progress Notes (Signed)
Chemo pump disconnected. Port flushed with good blood return noted. No bruising or swelling at site. Bandaid applied and patient discharged in satisfactory condition. VVS stable with no signs or symptoms of distressed noted. 

## 2023-10-19 ENCOUNTER — Ambulatory Visit (INDEPENDENT_AMBULATORY_CARE_PROVIDER_SITE_OTHER): Payer: No Typology Code available for payment source | Admitting: Urology

## 2023-10-19 ENCOUNTER — Encounter: Payer: Self-pay | Admitting: Urology

## 2023-10-19 VITALS — BP 131/83 | HR 96

## 2023-10-19 DIAGNOSIS — R339 Retention of urine, unspecified: Secondary | ICD-10-CM

## 2023-10-19 DIAGNOSIS — N433 Hydrocele, unspecified: Secondary | ICD-10-CM

## 2023-10-19 DIAGNOSIS — N529 Male erectile dysfunction, unspecified: Secondary | ICD-10-CM

## 2023-10-19 LAB — URINALYSIS, ROUTINE W REFLEX MICROSCOPIC
Bilirubin, UA: NEGATIVE
Glucose, UA: NEGATIVE
Ketones, UA: NEGATIVE
Leukocytes,UA: NEGATIVE
Nitrite, UA: NEGATIVE
Protein,UA: NEGATIVE
RBC, UA: NEGATIVE
Specific Gravity, UA: 1.03 (ref 1.005–1.030)
Urobilinogen, Ur: 0.2 mg/dL (ref 0.2–1.0)
pH, UA: 6 (ref 5.0–7.5)

## 2023-10-19 MED ORDER — TADALAFIL 5 MG PO TABS: 5.0000 mg | ORAL_TABLET | Freq: Every day | ORAL | 11 refills | Status: AC

## 2023-10-19 NOTE — Patient Instructions (Signed)
Hydrocele, Adult A hydrocele is a collection of fluid in the loose pouch of skin that holds the testicles (scrotum). It can occur in one or both testicles. This may happen because: The amount of fluid produced in the scrotum is not absorbed by the rest of the body. Fluid from the abdomen fills the scrotum. Normally, the testicles develop in the abdomen and then drop into the scrotum before birth. The tube that the testicles travel through usually closes after the testicles drop. If the tube does not close, fluid from the abdomen can fill the scrotum. This is not very common in adults. What are the causes? A hydrocele may be caused by: An injury to the scrotum. An infection. Decreased blood flow to the scrotum. Twisting of a testicle (testicular torsion). A birth defect. A tumor or cancer of the testicle. Sometimes, the cause is not known. What are the signs or symptoms? A hydrocele feels like a water-filled balloon. It may also feel heavy. Other symptoms include: Swelling of the scrotum. The swelling may decrease when you lie down. You may also notice more swelling at night than in the morning. This is called a communicating hydrocele, in which the fluid in the scrotum goes back into the abdominal cavity when the position of the scrotum changes. Swelling of the groin. Mild discomfort in the scrotum. Pain. This can develop if the hydrocele was caused by infection or twisting. The larger the hydrocele, the more likely you are to have pain. Swelling may also cause pain. How is this diagnosed? This condition may be diagnosed based on a physical exam and your medical history. You may also have tests, including: Imaging tests, such as an ultrasound. A transillumination test. This test takes place in a dark room where a light is placed on the skin of the scrotum. Clear liquid will not impede the light and the scrotum will be illuminated. This helps a health care provider distinguish a hydrocele from a  tumor. Blood or urine tests. How is this treated? Most hydroceles go away on their own. If you have no discomfort or pain, your health care provider may suggest close monitoring of your condition until the condition goes away or symptoms develop. This is called watch and wait or watchful waiting. If treatment is needed, it may include: Treating an underlying condition. This may include taking an antibiotic medicine to treat an infection. Having surgery to stop fluid from collecting in the scrotum. Having surgery to drain the fluid. Surgery may include: Hydrocelectomy. For this procedure, an incision is made in the scrotum to remove the fluid. Needle aspiration. A needle is used to drain fluid. However, the fluid buildup will come back quickly and may lead to an infection of the scrotum. This is rarely done. Follow these instructions at home: Medicines Take over-the-counter and prescription medicines only as told by your health care provider. If you were prescribed an antibiotic medicine, take it as told by your health care provider. Do not stop taking the antibiotic even if you start to feel better. General instructions Watch the hydrocele for any changes. Keep all follow-up visits. This is important. Contact a health care provider if: You notice any changes in the hydrocele. The swelling in your scrotum or groin gets worse. The hydrocele becomes red, firm, painful, or tender to the touch. You have a fever. Get help right away if you: Develop a lot of pain or your pain becomes worse. Have chills. Have a high fever. Summary A hydrocele is   a collection of fluid in the loose pouch of skin that holds the testicles (scrotum). A hydrocele can cause swelling, discomfort, and pain. In adults, the cause of a hydrocele may not be known. However, it is sometimes caused by an infection or the twisting of a testicle. Hydroceles often go away on their own. If a hydrocele causes pain, treating the  underlying cause may be needed to ease the pain. This information is not intended to replace advice given to you by your health care provider. Make sure you discuss any questions you have with your health care provider. Document Revised: 06/02/2021 Document Reviewed: 06/02/2021 Elsevier Patient Education  2024 Elsevier Inc.  
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
## 2023-10-30 ENCOUNTER — Inpatient Hospital Stay: Payer: No Typology Code available for payment source | Admitting: Hematology

## 2023-10-30 ENCOUNTER — Inpatient Hospital Stay: Payer: No Typology Code available for payment source

## 2023-10-30 ENCOUNTER — Other Ambulatory Visit: Payer: No Typology Code available for payment source

## 2023-11-01 ENCOUNTER — Inpatient Hospital Stay: Payer: No Typology Code available for payment source

## 2023-12-02 ENCOUNTER — Encounter: Payer: Self-pay | Admitting: Hematology

## 2023-12-25 ENCOUNTER — Encounter: Payer: Self-pay | Admitting: Hematology

## 2024-01-01 ENCOUNTER — Ambulatory Visit: Payer: Self-pay | Admitting: Neurology

## 2024-01-21 ENCOUNTER — Other Ambulatory Visit: Payer: Self-pay | Admitting: Neurology

## 2024-02-13 ENCOUNTER — Ambulatory Visit: Payer: No Typology Code available for payment source | Admitting: Family Medicine

## 2024-02-18 ENCOUNTER — Ambulatory Visit: Payer: No Typology Code available for payment source | Admitting: Family Medicine

## 2024-04-08 ENCOUNTER — Other Ambulatory Visit: Payer: Self-pay | Admitting: Family Medicine

## 2024-04-08 DIAGNOSIS — I10 Essential (primary) hypertension: Secondary | ICD-10-CM

## 2024-06-21 ENCOUNTER — Other Ambulatory Visit: Payer: Self-pay | Admitting: Family Medicine

## 2024-06-21 DIAGNOSIS — E785 Hyperlipidemia, unspecified: Secondary | ICD-10-CM

## 2024-06-24 ENCOUNTER — Other Ambulatory Visit: Payer: Self-pay

## 2024-07-04 ENCOUNTER — Other Ambulatory Visit: Payer: Self-pay | Admitting: *Deleted
